# Patient Record
Sex: Male | Born: 1935 | Race: Black or African American | Hispanic: No | Marital: Married | Smoking: Former smoker
Health system: Southern US, Community
[De-identification: ages and names within clinical notes are randomized; demographics above are authoritative.]

## PROBLEM LIST (undated history)

## (undated) DIAGNOSIS — I5042 Chronic combined systolic (congestive) and diastolic (congestive) heart failure: Secondary | ICD-10-CM

## (undated) DIAGNOSIS — N183 Chronic kidney disease, stage 3 unspecified: Secondary | ICD-10-CM

## (undated) DIAGNOSIS — R972 Elevated prostate specific antigen [PSA]: Secondary | ICD-10-CM

## (undated) DIAGNOSIS — I442 Atrioventricular block, complete: Secondary | ICD-10-CM

## (undated) DIAGNOSIS — M109 Gout, unspecified: Secondary | ICD-10-CM

## (undated) DIAGNOSIS — L84 Corns and callosities: Secondary | ICD-10-CM

## (undated) DIAGNOSIS — Z950 Presence of cardiac pacemaker: Secondary | ICD-10-CM

## (undated) DIAGNOSIS — R001 Bradycardia, unspecified: Secondary | ICD-10-CM

## (undated) DIAGNOSIS — I428 Other cardiomyopathies: Secondary | ICD-10-CM

## (undated) DIAGNOSIS — I482 Chronic atrial fibrillation, unspecified: Secondary | ICD-10-CM

## (undated) DIAGNOSIS — I10 Essential (primary) hypertension: Secondary | ICD-10-CM

## (undated) DIAGNOSIS — R4189 Other symptoms and signs involving cognitive functions and awareness: Secondary | ICD-10-CM

## (undated) HISTORY — DX: Chronic kidney disease, stage 3 (moderate): N18.3

## (undated) HISTORY — DX: Elevated prostate specific antigen (PSA): R97.20

## (undated) HISTORY — DX: Chronic atrial fibrillation, unspecified: I48.20

## (undated) HISTORY — DX: Other symptoms and signs involving cognitive functions and awareness: R41.89

## (undated) HISTORY — DX: Presence of cardiac pacemaker: Z95.0

## (undated) HISTORY — DX: Gout, unspecified: M10.9

## (undated) HISTORY — DX: Atrioventricular block, complete: I44.2

## (undated) HISTORY — DX: Essential (primary) hypertension: I10

## (undated) HISTORY — DX: Other cardiomyopathies: I42.8

## (undated) HISTORY — DX: Chronic combined systolic (congestive) and diastolic (congestive) heart failure: I50.42

## (undated) HISTORY — DX: Corns and callosities: L84

## (undated) HISTORY — DX: Chronic kidney disease, stage 3 unspecified: N18.30

---

## 1996-05-12 HISTORY — PX: PACEMAKER INSERTION: SHX728

## 2004-01-03 ENCOUNTER — Inpatient Hospital Stay (HOSPITAL_COMMUNITY): Admission: AD | Admit: 2004-01-03 | Discharge: 2004-01-06 | Payer: Self-pay | Admitting: *Deleted

## 2012-02-11 ENCOUNTER — Encounter: Payer: Self-pay | Admitting: Internal Medicine

## 2012-02-11 ENCOUNTER — Encounter: Payer: Self-pay | Admitting: *Deleted

## 2012-02-11 ENCOUNTER — Ambulatory Visit (INDEPENDENT_AMBULATORY_CARE_PROVIDER_SITE_OTHER): Payer: Medicare Other | Admitting: Internal Medicine

## 2012-02-11 VITALS — BP 130/68 | HR 71 | Resp 18 | Ht 66.0 in | Wt 117.1 lb

## 2012-02-11 DIAGNOSIS — I442 Atrioventricular block, complete: Secondary | ICD-10-CM

## 2012-02-11 DIAGNOSIS — I10 Essential (primary) hypertension: Secondary | ICD-10-CM

## 2012-02-11 DIAGNOSIS — I4891 Unspecified atrial fibrillation: Secondary | ICD-10-CM

## 2012-02-11 DIAGNOSIS — Z950 Presence of cardiac pacemaker: Secondary | ICD-10-CM | POA: Insufficient documentation

## 2012-02-11 DIAGNOSIS — Z01812 Encounter for preprocedural laboratory examination: Secondary | ICD-10-CM

## 2012-02-11 DIAGNOSIS — I495 Sick sinus syndrome: Secondary | ICD-10-CM

## 2012-02-11 NOTE — Assessment & Plan Note (Signed)
His pacemaker has reached elective placement. We will schedule him to undergo pacemaker removal and insertion of a new device in the next few weeks.

## 2012-02-11 NOTE — Assessment & Plan Note (Signed)
His ventricular rate is well controlled with underlying complete heart block. No change in medical therapy.

## 2012-02-11 NOTE — Assessment & Plan Note (Signed)
His blood pressure is well controlled. He will continue a low-sodium diet and his current medical therapy.

## 2012-02-11 NOTE — Progress Notes (Signed)
HPI Charles Hendricks is referred today for evaluation of a device at [facility]. He is a very pleasant elderly man with chronic complete heart block and atrial fibrillation. He is status post pacemaker insertion initially in 1998 with subsequent generator change in 2005. In the interim, he has been stable. He denies syncope, chest pain, peripheral edema, or shortness of breath. He denies dietary indiscretion. No syncope. He has no other complaints today. No Known Allergies   Current Outpatient Prescriptions  Medication Sig Dispense Refill  . digoxin (LANOXIN) 0.125 MG tablet       . lisinopril (PRINIVIL,ZESTRIL) 10 MG tablet       . metoprolol succinate (TOPROL-XL) 100 MG 24 hr tablet       . triamterene-hydrochlorothiazide (MAXZIDE-25) 37.5-25 MG per tablet       . warfarin (COUMADIN) 4 MG tablet          No past medical history on file.  ROS:   All systems reviewed and negative except as noted in the HPI.   No past surgical history on file.   No family history on file.   History   Social History  . Marital Status: Married    Spouse Name: N/A    Number of Children: N/A  . Years of Education: N/A   Occupational History  . Not on file.   Social History Main Topics  . Smoking status: Never Smoker   . Smokeless tobacco: Not on file  . Alcohol Use: Not on file  . Drug Use: Not on file  . Sexually Active: Not on file   Other Topics Concern  . Not on file   Social History Narrative  . No narrative on file     BP 130/68  Pulse 71  Resp 18  Ht 5' 6" (1.676 m)  Wt 117 lb 1.9 oz (53.125 kg)  BMI 18.90 kg/m2  SpO2 97%  Physical Exam:  Well appearing elderly man, NAD HEENT: Unremarkable Neck:  No JVD, no thyromegally Lungs:  Clear with no wheezes, rales, or rhonchi. Well-healed ICD incision. HEART:  Regular rate rhythm, no murmurs, no rubs, no clicks Abd:  soft, positive bowel sounds, no organomegally, no rebound, no guarding Ext:  2 plus pulses, no edema, no cyanosis,  no clubbing Skin:  No rashes no nodules Neuro:  CN II through XII intact, motor grossly intact  DEVICE  Normal device function.  See PaceArt for details.   Assess/Plan:  

## 2012-02-25 ENCOUNTER — Encounter: Payer: Self-pay | Admitting: Internal Medicine

## 2012-02-26 ENCOUNTER — Other Ambulatory Visit (INDEPENDENT_AMBULATORY_CARE_PROVIDER_SITE_OTHER): Payer: Medicare Other

## 2012-02-26 DIAGNOSIS — I4891 Unspecified atrial fibrillation: Secondary | ICD-10-CM

## 2012-02-26 DIAGNOSIS — Z01812 Encounter for preprocedural laboratory examination: Secondary | ICD-10-CM

## 2012-02-26 LAB — CBC WITH DIFFERENTIAL/PLATELET
Basophils Absolute: 0 10*3/uL (ref 0.0–0.1)
Basophils Relative: 0.4 % (ref 0.0–3.0)
Eosinophils Relative: 3.5 % (ref 0.0–5.0)
HCT: 34.2 % — ABNORMAL LOW (ref 39.0–52.0)
MCHC: 32.5 g/dL (ref 30.0–36.0)
MCV: 97.2 fl (ref 78.0–100.0)
Monocytes Absolute: 0.6 10*3/uL (ref 0.1–1.0)
Monocytes Relative: 12 % (ref 3.0–12.0)
RBC: 3.52 Mil/uL — ABNORMAL LOW (ref 4.22–5.81)
WBC: 4.7 10*3/uL (ref 4.5–10.5)

## 2012-02-26 LAB — BASIC METABOLIC PANEL
BUN: 38 mg/dL — ABNORMAL HIGH (ref 6–23)
CO2: 29 mEq/L (ref 19–32)
Calcium: 9.2 mg/dL (ref 8.4–10.5)
Chloride: 106 mEq/L (ref 96–112)
Glucose, Bld: 100 mg/dL — ABNORMAL HIGH (ref 70–99)

## 2012-03-04 MED ORDER — SODIUM CHLORIDE 0.9 % IV SOLN: 250.0000 mL | INTRAVENOUS | Status: DC

## 2012-03-04 MED ORDER — SODIUM CHLORIDE 0.9 % IJ SOLN: 3.0000 mL | Freq: Two times a day (BID) | INTRAMUSCULAR | Status: DC

## 2012-03-04 MED ORDER — SODIUM CHLORIDE 0.45 % IV SOLN
INTRAVENOUS | Status: DC
  Administered 2012-03-05: 07:00:00 via INTRAVENOUS

## 2012-03-04 MED ORDER — CEFAZOLIN SODIUM-DEXTROSE 2-3 GM-% IV SOLR
2.0000 g | INTRAVENOUS | Status: DC
  Filled 2012-03-04 (×2): qty 50

## 2012-03-04 MED ORDER — SODIUM CHLORIDE 0.9 % IJ SOLN: 3.0000 mL | INTRAMUSCULAR | Status: DC | PRN

## 2012-03-04 MED ORDER — SODIUM CHLORIDE 0.9 % IR SOLN
80.0000 mg | Status: DC
  Filled 2012-03-04: qty 2

## 2012-03-04 NOTE — Addendum Note (Signed)
Addended by: Marrion Coy L on: 03/04/2012 12:06 PM   Modules accepted: Orders

## 2012-03-05 ENCOUNTER — Encounter (HOSPITAL_COMMUNITY)
Admission: RE | Disposition: A | Discharge status: Home / Self Care | Payer: Self-pay | Source: Ambulatory Visit | Attending: Internal Medicine

## 2012-03-05 ENCOUNTER — Ambulatory Visit (HOSPITAL_COMMUNITY): Payer: Medicare Other

## 2012-03-05 ENCOUNTER — Ambulatory Visit (HOSPITAL_COMMUNITY)
Admission: RE | Admit: 2012-03-05 | Discharge: 2012-03-05 | Disposition: A | Discharge status: Home / Self Care | Payer: Medicare Other | Source: Ambulatory Visit | Attending: Internal Medicine | Admitting: Internal Medicine

## 2012-03-05 ENCOUNTER — Encounter: Payer: Self-pay | Admitting: *Deleted

## 2012-03-05 DIAGNOSIS — I498 Other specified cardiac arrhythmias: Secondary | ICD-10-CM

## 2012-03-05 DIAGNOSIS — Z45018 Encounter for adjustment and management of other part of cardiac pacemaker: Secondary | ICD-10-CM | POA: Insufficient documentation

## 2012-03-05 DIAGNOSIS — Z01812 Encounter for preprocedural laboratory examination: Secondary | ICD-10-CM

## 2012-03-05 DIAGNOSIS — I4891 Unspecified atrial fibrillation: Secondary | ICD-10-CM | POA: Insufficient documentation

## 2012-03-05 HISTORY — PX: PERMANENT PACEMAKER GENERATOR CHANGE: SHX6022

## 2012-03-05 LAB — SURGICAL PCR SCREEN
MRSA, PCR: NEGATIVE
Staphylococcus aureus: NEGATIVE

## 2012-03-05 SURGERY — PERMANENT PACEMAKER GENERATOR CHANGE
Anesthesia: LOCAL

## 2012-03-05 MED ORDER — FENTANYL CITRATE 0.05 MG/ML IJ SOLN
INTRAMUSCULAR | Status: AC
  Filled 2012-03-05: qty 2

## 2012-03-05 MED ORDER — LIDOCAINE HCL (PF) 1 % IJ SOLN
INTRAMUSCULAR | Status: AC
  Filled 2012-03-05: qty 60

## 2012-03-05 MED ORDER — MIDAZOLAM HCL 5 MG/5ML IJ SOLN
INTRAMUSCULAR | Status: AC
  Filled 2012-03-05: qty 5

## 2012-03-05 MED ORDER — MUPIROCIN 2 % EX OINT
TOPICAL_OINTMENT | CUTANEOUS | Status: AC
  Filled 2012-03-05: qty 22

## 2012-03-05 MED ORDER — MUPIROCIN 2 % EX OINT
TOPICAL_OINTMENT | Freq: Two times a day (BID) | CUTANEOUS | Status: DC
  Administered 2012-03-05: 07:00:00 via NASAL

## 2012-03-05 NOTE — Op Note (Signed)
VVI PPM removal and insertion of a new device without immediate complication. Device was placed under pectoralis major.Z#[id number].

## 2012-03-05 NOTE — Op Note (Signed)
NAMESIXTO, HEIGEL NO.:  [id number]  MEDICAL RECORD NO.:  [id number]  LOCATION:  [facility]                         FACILITY:  [facility]  PHYSICIAN:  Doylene Canning. Ladona Ridgel, MD    DATE OF BIRTH:  08-Jan-1925  DATE OF PROCEDURE:  03/05/2012 DATE OF DISCHARGE:                              OPERATIVE REPORT   PROCEDURE PERFORMED:  Removal of a previously implanted single-chamber pacemaker, which had reached elective replacement followed by insertion of a new single-chamber pacemaker followed by pacemaker pocket revision.  INTRODUCTION:  The patient is an 78 year old man with a long history of symptomatic bradycardia and chronic atrial fibrillation.  His permanent pacemaker, which was implanted in 2005 has now reached elective replacement.  He is now referred for pacemaker removal and insertion of a new device.  Of note, the patient is very thin and has very little subcutaneous tissue, and the plan would be to place the new pacemaker subpectorally.  PROCEDURE:  After informed consent was obtained, the patient was taken to the diagnostic EP lab in a fasting state.  After usual preparation and draping, intravenous fentanyl and midazolam was given for sedation. 30 mL of lidocaine was infiltrated into the left infraclavicular region. A 5-cm incision was carried out over this region.  Electrocautery was utilized to dissect down to the pacemaker pocket.  The generator was removed with gentle traction.  Electrocautery was utilized to free up the pacemaker leads.  The lead was evaluated and the R-waves were 5, which was chronic and the threshold was 1.1 V at 0.5 msec with a pacing impedance of 446 ohms.  With these satisfactory parameters, the new Medtronic Sensia single-chamber pacemaker was connected to the ventricular pacing lead and at this point a subpectoral pocket was fashioned utilizing blunt dissection and electrocautery.  Having accomplished this, the new device was placed  under the pectoralis major muscle.  The pocket was irrigated with antibiotic irrigation. Electrocautery was utilized to assure hemostasis and the incision was closed with 2-0 and 3-0 Vicryl.  Benzoin and Steri-Strips were painted on the skin.  Pressure dressing was applied and the patient was returned to his room in satisfactory condition.  COMPLICATIONS:  There were no immediate procedure complications.  RESULTS:  This demonstrates successful removal of a previously implanted Medtronic single-chamber pacemaker followed by successful pacemaker pocket revision, insertion of a new single-chamber pacemaker without any immediate procedure complications.     Doylene Canning. Ladona Ridgel, MD     GWT/MEDQ  D:  03/05/2012  T:  03/05/2012  Job:  [id number]

## 2012-03-05 NOTE — H&P (View-Only) (Signed)
HPI Charles Hendricks is referred today for evaluation of a device at [facility]. He is a very pleasant elderly man with chronic complete heart block and atrial fibrillation. He is status post pacemaker insertion initially in 1998 with subsequent generator change in 2005. In the interim, he has been stable. He denies syncope, chest pain, peripheral edema, or shortness of breath. He denies dietary indiscretion. No syncope. He has no other complaints today. No Known Allergies   Current Outpatient Prescriptions  Medication Sig Dispense Refill  . digoxin (LANOXIN) 0.125 MG tablet       . lisinopril (PRINIVIL,ZESTRIL) 10 MG tablet       . metoprolol succinate (TOPROL-XL) 100 MG 24 hr tablet       . triamterene-hydrochlorothiazide (MAXZIDE-25) 37.5-25 MG per tablet       . warfarin (COUMADIN) 4 MG tablet          No past medical history on file.  ROS:   All systems reviewed and negative except as noted in the HPI.   No past surgical history on file.   No family history on file.   History   Social History  . Marital Status: Married    Spouse Name: N/A    Number of Children: N/A  . Years of Education: N/A   Occupational History  . Not on file.   Social History Main Topics  . Smoking status: Never Smoker   . Smokeless tobacco: Not on file  . Alcohol Use: Not on file  . Drug Use: Not on file  . Sexually Active: Not on file   Other Topics Concern  . Not on file   Social History Narrative  . No narrative on file     BP 130/68  Pulse 71  Resp 18  Ht 5\' 6"  (1.676 m)  Wt 117 lb 1.9 oz (53.125 kg)  BMI 18.90 kg/m2  SpO2 97%  Physical Exam:  Well appearing elderly man, NAD HEENT: Unremarkable Neck:  No JVD, no thyromegally Lungs:  Clear with no wheezes, rales, or rhonchi. Well-healed ICD incision. HEART:  Regular rate rhythm, no murmurs, no rubs, no clicks Abd:  soft, positive bowel sounds, no organomegally, no rebound, no guarding Ext:  2 plus pulses, no edema, no cyanosis,  no clubbing Skin:  No rashes no nodules Neuro:  CN II through XII intact, motor grossly intact  DEVICE  Normal device function.  See PaceArt for details.   Assess/Plan:

## 2012-03-05 NOTE — Interval H&P Note (Signed)
History and Physical Interval Note:  03/05/2012 7:21 AM  Charles Hendricks  has presented today for surgery, with the diagnosis of End of life  The various methods of treatment have been discussed with the patient and family. After consideration of risks, benefits and other options for treatment, the patient has consented to  Procedure(s) (LRB) with comments: PERMANENT PACEMAKER GENERATOR CHANGE (N/A) as a surgical intervention .  The patient's history has been reviewed, patient examined, no change in status, stable for surgery.  I have reviewed the patient's chart and labs.  Questions were answered to the patient's satisfaction.     Lewayne Bunting

## 2012-03-15 ENCOUNTER — Encounter: Payer: Self-pay | Admitting: Internal Medicine

## 2012-03-15 ENCOUNTER — Ambulatory Visit (INDEPENDENT_AMBULATORY_CARE_PROVIDER_SITE_OTHER): Payer: Medicare Other | Admitting: *Deleted

## 2012-03-15 DIAGNOSIS — I442 Atrioventricular block, complete: Secondary | ICD-10-CM

## 2012-03-15 LAB — PACEMAKER DEVICE OBSERVATION
BMOD-0003RV: 30
BRDY-0002RV: 60 {beats}/min
BRDY-0004RV: 130 {beats}/min
RV LEAD THRESHOLD: 1 V
VENTRICULAR PACING PM: 76

## 2012-03-15 NOTE — Progress Notes (Signed)
Wound check-PPM 

## 2013-02-11 ENCOUNTER — Other Ambulatory Visit: Payer: Self-pay | Admitting: Interventional Cardiology

## 2013-02-22 ENCOUNTER — Ambulatory Visit (INDEPENDENT_AMBULATORY_CARE_PROVIDER_SITE_OTHER): Payer: Medicare Other | Admitting: Pharmacist

## 2013-02-22 DIAGNOSIS — I4891 Unspecified atrial fibrillation: Secondary | ICD-10-CM

## 2013-02-22 LAB — POCT INR: INR: 2.8

## 2013-02-25 ENCOUNTER — Other Ambulatory Visit: Payer: Self-pay | Admitting: Interventional Cardiology

## 2013-03-05 ENCOUNTER — Other Ambulatory Visit: Payer: Self-pay | Admitting: Interventional Cardiology

## 2013-03-30 ENCOUNTER — Other Ambulatory Visit: Payer: Self-pay | Admitting: Interventional Cardiology

## 2013-04-30 ENCOUNTER — Emergency Department (HOSPITAL_COMMUNITY): Payer: Medicare Other

## 2013-04-30 ENCOUNTER — Emergency Department (HOSPITAL_COMMUNITY)
Admission: EM | Admit: 2013-04-30 | Discharge: 2013-04-30 | Disposition: A | Discharge status: Home / Self Care | Payer: Medicare Other | Attending: Emergency Medicine | Admitting: Emergency Medicine

## 2013-04-30 ENCOUNTER — Encounter (HOSPITAL_COMMUNITY): Payer: Self-pay | Admitting: Emergency Medicine

## 2013-04-30 DIAGNOSIS — R600 Localized edema: Secondary | ICD-10-CM

## 2013-04-30 DIAGNOSIS — R609 Edema, unspecified: Secondary | ICD-10-CM | POA: Insufficient documentation

## 2013-04-30 DIAGNOSIS — Z7901 Long term (current) use of anticoagulants: Secondary | ICD-10-CM | POA: Insufficient documentation

## 2013-04-30 DIAGNOSIS — Z79899 Other long term (current) drug therapy: Secondary | ICD-10-CM | POA: Insufficient documentation

## 2013-04-30 DIAGNOSIS — I4891 Unspecified atrial fibrillation: Secondary | ICD-10-CM | POA: Insufficient documentation

## 2013-04-30 DIAGNOSIS — I509 Heart failure, unspecified: Secondary | ICD-10-CM | POA: Insufficient documentation

## 2013-04-30 DIAGNOSIS — E8809 Other disorders of plasma-protein metabolism, not elsewhere classified: Secondary | ICD-10-CM | POA: Insufficient documentation

## 2013-04-30 HISTORY — DX: Bradycardia, unspecified: R00.1

## 2013-04-30 LAB — CBC
HCT: 33.3 % — ABNORMAL LOW (ref 39.0–52.0)
Hemoglobin: 11 g/dL — ABNORMAL LOW (ref 13.0–17.0)
MCH: 31.3 pg (ref 26.0–34.0)
MCV: 94.9 fL (ref 78.0–100.0)
RBC: 3.51 MIL/uL — ABNORMAL LOW (ref 4.22–5.81)
WBC: 4.4 10*3/uL (ref 4.0–10.5)

## 2013-04-30 LAB — COMPREHENSIVE METABOLIC PANEL
BUN: 35 mg/dL — ABNORMAL HIGH (ref 6–23)
CO2: 26 mEq/L (ref 19–32)
Calcium: 8.9 mg/dL (ref 8.4–10.5)
Creatinine, Ser: 1.64 mg/dL — ABNORMAL HIGH (ref 0.50–1.35)
GFR calc Af Amer: 42 mL/min — ABNORMAL LOW (ref >90)
GFR calc non Af Amer: 36 mL/min — ABNORMAL LOW (ref >90)
Glucose, Bld: 113 mg/dL — ABNORMAL HIGH (ref 70–99)

## 2013-04-30 LAB — TROPONIN I: Troponin I: <0.3 ng/mL (ref <0.30)

## 2013-04-30 LAB — PROTIME-INR
INR: 2.52 — ABNORMAL HIGH (ref 0.00–1.49)
Prothrombin Time: 26.3 seconds — ABNORMAL HIGH (ref 11.6–15.2)

## 2013-04-30 MED ORDER — POTASSIUM CHLORIDE CRYS ER 20 MEQ PO TBCR
40.0000 meq | EXTENDED_RELEASE_TABLET | Freq: Once | ORAL | Status: AC
  Administered 2013-04-30: 40 meq via ORAL
  Filled 2013-04-30: qty 2

## 2013-04-30 MED ORDER — POTASSIUM CHLORIDE CRYS ER 20 MEQ PO TBCR: 20.0000 meq | EXTENDED_RELEASE_TABLET | Freq: Every day | ORAL | Status: DC

## 2013-04-30 MED ORDER — FUROSEMIDE 10 MG/ML IJ SOLN
40.0000 mg | Freq: Once | INTRAMUSCULAR | Status: AC
  Administered 2013-04-30: 40 mg via INTRAVENOUS
  Filled 2013-04-30: qty 4

## 2013-04-30 MED ORDER — FUROSEMIDE 20 MG PO TABS: 20.0000 mg | ORAL_TABLET | Freq: Every day | ORAL | Status: DC

## 2013-04-30 MED ORDER — POTASSIUM CHLORIDE CRYS ER 20 MEQ PO TBCR: 20.0000 meq | EXTENDED_RELEASE_TABLET | Freq: Two times a day (BID) | ORAL | Status: DC

## 2013-04-30 NOTE — ED Provider Notes (Signed)
CSN: [id number]     Arrival date & time 04/30/13  0908 History   First MD Initiated Contact with Patient 04/30/13 0915     Chief Complaint  Patient presents with  . Leg Swelling   (Consider location/radiation/quality/duration/timing/severity/associated sxs/prior Treatment) The history is provided by the patient.  pt notes progressive bilateral leg swelling for the past 1-2 weeks. Constant. Mod-sev. Denies hx same, x possibly mild bil ankle/foot swelling. States now swelling is up to abdomen. Denies sob. No orthopnea or pnd. Is eating/drinking normally. States urinating of normal amount and frequency. Denies hx of liver or kidney disease. No hx chf. Denies any current or recent chest pain or discomfort. States compliant w taking meds, and no recent change in meds.       No past medical history on file. No past surgical history on file. No family history on file. History  Substance Use Topics  . Smoking status: Never Smoker   . Smokeless tobacco: Not on file  . Alcohol Use: Not on file    Review of Systems  Constitutional: Negative for fever and chills.  HENT: Negative for sore throat.   Eyes: Negative for redness.  Respiratory: Negative for cough and shortness of breath.   Cardiovascular: Positive for leg swelling. Negative for chest pain.  Gastrointestinal: Negative for nausea, vomiting, abdominal pain and diarrhea.  Genitourinary: Negative for flank pain.  Musculoskeletal: Negative for back pain and neck pain.  Skin: Negative for rash.  Neurological: Negative for headaches.  Hematological: Does not bruise/bleed easily.  Psychiatric/Behavioral: Negative for confusion.    Allergies  Review of patient's allergies indicates no known allergies.  Home Medications   Current Outpatient Rx  Name  Route  Sig  Dispense  Refill  . acetaminophen (TYLENOL) 500 MG tablet   Oral   Take 1,000 mg by mouth every 6 (six) hours as needed. Pain         . digoxin (LANOXIN) 0.125 MG  tablet      take 1 tablet by mouth once daily   30 tablet   1     Patient need to schedule a follow up appointment.  ...   . lisinopril (PRINIVIL,ZESTRIL) 10 MG tablet      take 1 tablet by mouth once daily   30 tablet   1     Please call to schedule a follow up appointment.33 ...   . metoprolol succinate (TOPROL-XL) 100 MG 24 hr tablet      take 1 tablet by mouth once daily   30 tablet   1     Patient needs to schedule a follow up appointment. ...   . triamterene-hydrochlorothiazide (MAXZIDE-25) 37.5-25 MG per tablet      take 1 tablet by mouth every morning   30 tablet   1     Please call to schedule a follow up appointment.33 ...   . warfarin (COUMADIN) 4 MG tablet      TAKE 1 TABLET BY MOUTH ONCE DAILY EXCEPT A 1/2 ON MONDAY,WEDNESDAY   30 tablet   5   . warfarin (COUMADIN) 5 MG tablet   Oral   Take 5 mg by mouth daily.          BP 159/77  Pulse 78  Resp 18 Physical Exam  Nursing note and vitals reviewed. Constitutional: He is oriented to person, place, and time. He appears well-developed and well-nourished. No distress.  HENT:  Head: Atraumatic.  Eyes: Conjunctivae are normal.  Neck: Neck supple.  No JVD present. No tracheal deviation present.  Cardiovascular: Normal rate, regular rhythm, normal heart sounds and intact distal pulses.   Pulmonary/Chest: Effort normal and breath sounds normal. No accessory muscle usage. No respiratory distress.  Abdominal: Soft. Bowel sounds are normal. He exhibits no distension and no mass. There is no tenderness. There is no rebound and no guarding.  Musculoskeletal: Normal range of motion. He exhibits edema. He exhibits no tenderness.  Symmetric bilateral leg edema to abd.  Neurological: He is alert and oriented to person, place, and time.  Skin: Skin is warm and dry. He is not diaphoretic.  Psychiatric: He has a normal mood and affect.    ED Course  Procedures (including critical care time)   Results for  orders placed during the hospital encounter of 04/30/13  PRO B NATRIURETIC PEPTIDE      Result Value Range   Pro B Natriuretic peptide (BNP) 3155.0 (*) 0 - 450 pg/mL  CBC      Result Value Range   WBC 4.4  4.0 - 10.5 K/uL   RBC 3.51 (*) 4.22 - 5.81 MIL/uL   Hemoglobin 11.0 (*) 13.0 - 17.0 g/dL   HCT 95.2 (*) 84.1 - 32.4 %   MCV 94.9  78.0 - 100.0 fL   MCH 31.3  26.0 - 34.0 pg   MCHC 33.0  30.0 - 36.0 g/dL   RDW 40.1  02.7 - 25.3 %   Platelets 196  150 - 400 K/uL  COMPREHENSIVE METABOLIC PANEL      Result Value Range   Sodium 143  135 - 145 mEq/L   Potassium 3.4 (*) 3.5 - 5.1 mEq/L   Chloride 107  96 - 112 mEq/L   CO2 26  19 - 32 mEq/L   Glucose, Bld 113 (*) 70 - 99 mg/dL   BUN 35 (*) 6 - 23 mg/dL   Creatinine, Ser 6.64 (*) 0.50 - 1.35 mg/dL   Calcium 8.9  8.4 - 40.3 mg/dL   Total Protein 6.8  6.0 - 8.3 g/dL   Albumin 3.1 (*) 3.5 - 5.2 g/dL   AST 25  0 - 37 U/L   ALT 17  0 - 53 U/L   Alkaline Phosphatase 87  39 - 117 U/L   Total Bilirubin 0.8  0.3 - 1.2 mg/dL   GFR calc non Af Amer 36 (*) >90 mL/min   GFR calc Af Amer 42 (*) >90 mL/min  TROPONIN I      Result Value Range   Troponin I <0.30  <0.30 ng/mL  PROTIME-INR      Result Value Range   Prothrombin Time 26.3 (*) 11.6 - 15.2 seconds   INR 2.52 (*) 0.00 - 1.49  DIGOXIN LEVEL      Result Value Range   Digoxin Level 0.6 (*) 0.8 - 2.0 ng/mL   Dg Chest 2 View  04/30/2013   CLINICAL DATA:  Bilateral leg pain and swelling.  EXAM: CHEST  2 VIEW  COMPARISON:  03/05/2012.  FINDINGS: The cardiopericardial silhouette is enlarged. This appears little changed compared to prior exam. There is a left subclavian single lead pacemaker. Chronic right pleural apical thickening. There is no airspace disease. No effusion. Emphysema is present.  IMPRESSION: Cardiomegaly without failure. No interval change or acute abnormality.   Electronically Signed   By: Andreas Newport M.D.   On: 04/30/2013 10:47     EKG Interpretation     Date/Time:  Saturday April 30 2013 09:37:36 EST Ventricular Rate:  80 PR Interval:    QRS Duration: 94 QT Interval:  406 QTC Calculation: 468 R Axis:   -62 Text Interpretation:  Atrial fibrillation Ventricular premature complex Left anterior fascicular block Consider left ventricular hypertrophy Nonspecific T abnormalities, lateral leads No previous tracing Confirmed by Deniel Mcquiston  MD, Abagail Limb ([id number]) on 04/30/2013 9:45:48 AM            MDM  Iv ns. Labs. Monitor.  Reviewed nursing notes and prior charts for additional history.   Pt with chronic afib, is therapeutic on coumadin w inr 2.5.  Renal fxn improved from prior. k sl low. hgb c/w prior.   bnp elevated, although no prior result to compare.  Leg edema. Lasix iv.  kcl po.  Discussed tx options with patient including offering admission for diuresis, med adjustment, observation, etc - pt indicates he does not want to be admitted to the hospital, he requests that we discharge him home. He states he would take rx/meds at home, but doesn't want to stay overnight in the hospital.  States he has family in area who can assist if he needs them.  Pt indicates he feels he is breathing at baseline, and can manage for himself at home.  Recheck good urine output.   No increased wob from prior.   Again discussed admission to hospital, given age, edema, elevated bnp - pt voices his understanding, indicates he does appreciate our concern and ed care provided, but he is not willing to stay in hospital, requests d/c.   Will give few day rx lasix, and request pcp follow up Monday.  Discussed to return to ED if reconsider/wants admit, if worse, trouble breathing, other concern.        Suzi Roots, MD 04/30/13 [phone]

## 2013-04-30 NOTE — ED Notes (Signed)
Bilateral leg swelling since 1 week ago. Denies sob at rest, denies chest pain.  Swelling present bilaterally.  Scrotum swelling.

## 2013-04-30 NOTE — ED Notes (Signed)
Bilateral leg swelling that progressively got worse since 1 week ago per pt, also c/o increased dyspnea on exertion.

## 2013-05-03 ENCOUNTER — Telehealth: Payer: Self-pay

## 2013-05-04 MED ORDER — METOPROLOL SUCCINATE ER 100 MG PO TB24: 100.0000 mg | ORAL_TABLET | Freq: Every day | ORAL | Status: DC

## 2013-05-04 MED ORDER — LISINOPRIL 10 MG PO TABS: 10.0000 mg | ORAL_TABLET | Freq: Every day | ORAL | Status: DC

## 2013-05-04 MED ORDER — TRIAMTERENE-HCTZ 37.5-25 MG PO TABS: 1.0000 | ORAL_TABLET | Freq: Every day | ORAL | Status: DC

## 2013-05-04 NOTE — Telephone Encounter (Signed)
Done

## 2013-06-04 ENCOUNTER — Telehealth: Payer: Self-pay | Admitting: Physician Assistant

## 2013-06-04 NOTE — Telephone Encounter (Signed)
Patient called answering service for refills. He is quite confused about his medications. He has a hx of AFib, s/p pacer.   He was seen in the ED last month with volume overload. He refused admission. He has not been seen in the coumadin clinic since 02/2013. He tells me that he also had the flu recently.   He was able to tell me that he is only taking Furosemide and Potassium. He has been out of his other medications for at least 2 weeks.  It is not clear how long he has been out as he does not seem sure. He states his edema is better.  He has stable DOE.  No CP or syncope. I am not sure how he has been taking his meds. He has Dig 0.125, Lisinopril 10, Toprol 100, Maxzide 37.5/25 and coumadin listed in addition to Lasix and K+. I have recommended that he be seen in the office for an office visit to review his records and his medications to see what he should be on.  Not sure he is a candidate for coumadin with non-compliance issues. I advised him to go to the ED if he develops chest pain, worsening dyspnea, edema, syncope or near syncope this weekend. Otherwise, will have RN for Dr. Verdis Prime call him Monday to help get him an appt so we can review his medications. He will need a BMET to follow up on renal fxn and K+ given recent initiation of lasix and K+. Tereso Newcomer, PA-C   06/04/2013 12:25 PM

## 2013-06-05 NOTE — Telephone Encounter (Signed)
I agree. He needs to come in for f/u.

## 2013-06-06 NOTE — Telephone Encounter (Signed)
called pt to schedule an appt.appt made with Dr.Smith for 06/07/13 @3 :45pm. pt is confused and does not know what mediactions he is taking or which medications need refills.pt got upset and thought I was calling about an outstanding bill owed to the office.adv pt I was calling to check on his well being.pt sts that his lower edema has improved, but his feet are "still bad"   Pt sts that his wife past 2-3 months ago and he has had a difficult time. Asked pt if he would be interested in nurse coming to his home to assist him with his medications and check on him. Pt sts "No, I dont need any help"the patient was adv to bring all medication bottles with him to his appt.pt given the office address and phone number to call if he has issue getting here.pt verbalized understanding

## 2013-06-07 ENCOUNTER — Encounter (INDEPENDENT_AMBULATORY_CARE_PROVIDER_SITE_OTHER): Payer: Self-pay

## 2013-06-07 ENCOUNTER — Ambulatory Visit (INDEPENDENT_AMBULATORY_CARE_PROVIDER_SITE_OTHER): Payer: Medicare Other | Admitting: Pharmacist

## 2013-06-07 ENCOUNTER — Encounter: Payer: Self-pay | Admitting: *Deleted

## 2013-06-07 ENCOUNTER — Ambulatory Visit (INDEPENDENT_AMBULATORY_CARE_PROVIDER_SITE_OTHER): Payer: Medicare Other | Admitting: Interventional Cardiology

## 2013-06-07 ENCOUNTER — Telehealth: Payer: Self-pay

## 2013-06-07 VITALS — BP 124/62 | HR 80 | Ht 67.0 in | Wt 121.0 lb

## 2013-06-07 DIAGNOSIS — Z950 Presence of cardiac pacemaker: Secondary | ICD-10-CM

## 2013-06-07 DIAGNOSIS — I4891 Unspecified atrial fibrillation: Secondary | ICD-10-CM

## 2013-06-07 DIAGNOSIS — Z7901 Long term (current) use of anticoagulants: Secondary | ICD-10-CM

## 2013-06-07 DIAGNOSIS — R4189 Other symptoms and signs involving cognitive functions and awareness: Secondary | ICD-10-CM

## 2013-06-07 DIAGNOSIS — I5042 Chronic combined systolic (congestive) and diastolic (congestive) heart failure: Secondary | ICD-10-CM | POA: Insufficient documentation

## 2013-06-07 DIAGNOSIS — Z5181 Encounter for therapeutic drug level monitoring: Secondary | ICD-10-CM

## 2013-06-07 DIAGNOSIS — I442 Atrioventricular block, complete: Secondary | ICD-10-CM

## 2013-06-07 DIAGNOSIS — I10 Essential (primary) hypertension: Secondary | ICD-10-CM

## 2013-06-07 DIAGNOSIS — I5032 Chronic diastolic (congestive) heart failure: Secondary | ICD-10-CM

## 2013-06-07 DIAGNOSIS — F09 Unspecified mental disorder due to known physiological condition: Secondary | ICD-10-CM

## 2013-06-07 LAB — POCT INR: INR: 1.1

## 2013-06-07 MED ORDER — FUROSEMIDE 40 MG PO TABS
40.0000 mg | ORAL_TABLET | Freq: Every day | ORAL | Status: DC
Start: 2013-06-07 — End: 2013-06-10

## 2013-06-07 MED ORDER — POTASSIUM CHLORIDE CRYS ER 20 MEQ PO TBCR: 20.0000 meq | EXTENDED_RELEASE_TABLET | Freq: Every day | ORAL | Status: DC

## 2013-06-07 MED ORDER — DIGOXIN 125 MCG PO TABS: 0.1250 mg | ORAL_TABLET | Freq: Every day | ORAL | Status: DC

## 2013-06-07 NOTE — Progress Notes (Signed)
Patient ID: Charles Hendricks, male   DOB: 09-28-1924, 80 y.o.   MRN: [id number] Past Medical History  Non-ischemic cardiomyopathy, last EF 35% by Echo at [facility] 2008   Hypertension   Atrial fibrillation/ Sick Sinus syndrome, 1998   Pacemaker, !998. Power source change 2005   Gout   elevated PSA, asymptomatic   Corns and callosities      [address] [place], [place]  [address] Phone: [phone] Fax:  [phone]  Date:  06/07/2013   ID:  Charles Hendricks, DOB 03/23/1925, MRN [id number]  PCP:  Pearla Dubonnet, MD   ASSESSMENT:  1. Atrial fibrillation, chronic 2. Chronic Coumadin therapy 3. Hypertension, controlled 4. Chronic systolic heart failure, improved 5. Medication noncompliance, due to cognitive impairment/memory loss suspect   PLAN:  1. We need to contact family members to help determine if he is safe to live at home and also inquire about whether cognitive impairment has been observed 2 . PT/INR today in Coumadin clinic 3. Basic metabolic panel 4. Current medical regimen is simplified to Coumadin as directed by the Coumadin clinic; digoxin 0.125 mg daily; furosemide 40 mg per day; and potassium 20 mEq per day 5. Notify primary physician   SUBJECTIVE: Charles Hendricks is a 80 y.o. male is here because he seems to be confused about his medical regimen. He is now on furosemide and potassium. He seems confused about whether he is taking anything else. Other medications previously taken ( lisinopril/Maxide/ metoprolol) have been discontinued by the patient.Marland Kitchen He was on metoprolol and lisinopril because of systolic dysfunction. He is on warfarin because of chronic atrial fibrillation for stroke prevention. At one moment he was sedated he is only taking furosemide and potassium. But then he will say I also take warfarin if asked. He has not had an INR performed since October. He denies orthopnea, PND, and lower extremity swelling. He states that Dr. Kevan Ny started  furosemide and potassium after his Lasix will several weeks ago. The swelling has now resolved.   Wt Readings from Last 3 Encounters:  06/07/13 121 lb (54.885 kg)  04/30/13 129 lb (58.514 kg)  03/05/12 119 lb (53.978 kg)     Past Medical History  Diagnosis Date  . Bradycardia   . Atrial fibrillation   . Complete heart block   . HTN (hypertension), benign   . Pacemaker-Medtronic     Current Outpatient Prescriptions  Medication Sig Dispense Refill  . digoxin (LANOXIN) 0.125 MG tablet Take 0.125 mg by mouth daily.      . furosemide (LASIX) 20 MG tablet Take 1 tablet (20 mg total) by mouth daily.  5 tablet  0  . lisinopril (PRINIVIL,ZESTRIL) 10 MG tablet Take 1 tablet (10 mg total) by mouth daily.  30 tablet  3  . potassium chloride SA (K-DUR,KLOR-CON) 20 MEQ tablet Take 1 tablet (20 mEq total) by mouth daily.  10 tablet  0  . warfarin (COUMADIN) 4 MG tablet Take 4 mg by mouth See admin instructions. Take one tablet everyday except 1/2 a tablet on Mondays and Wednesday      . metoprolol succinate (TOPROL-XL) 100 MG 24 hr tablet Take 1 tablet (100 mg total) by mouth daily. Take with or immediately following a meal.  30 tablet  3  . triamterene-hydrochlorothiazide (MAXZIDE-25) 37.5-25 MG per tablet Take 1 tablet by mouth daily.  30 tablet  3   No current facility-administered medications for this visit.    Allergies:   No Known Allergies  Social History:  The patient  reports that he has quit smoking. He does not have any smokeless tobacco history on file. He reports that he does not drink alcohol.   ROS:  Please see the history of present illness.    All other systems reviewed and negative.   OBJECTIVE: VS:  BP 124/62  Pulse 80  Ht 5\' 7"  (1.702 m)  Wt 121 lb (54.885 kg)  BMI 18.95 kg/m2 Well nourished, well developed, in no acute distress, slender, elderly, and occasionally appearing confused HEENT: normal Neck: JVD flat. Carotid bruit absent  Cardiac:  normal S1, S2; IIRR;  no murmur Lungs:  clear to auscultation bilaterally, no wheezing, rhonchi or rales Abd: soft, nontender, no hepatomegaly Ext: Edema trace to 1+ bilateral. Pulses trace posterior tibial bilaterally Skin: warm and dry Neuro:  CNs 2-12 intact, no focal abnormalities noted  EKG:  Atrial fibrillation with ventricular rate 70 beats per minute and occasional V. paced.       Signed, Darci NeedleHenry W. B. Smith III, MD 06/07/2013 3:44 PM

## 2013-06-07 NOTE — Telephone Encounter (Signed)
pt was seen inthe office tpday by Dr.Smith.Dr.Smith was concerned about the pt ability to manage his medications.pt gave [place] grandaughter phone number.called her and lmom for her to call the office.

## 2013-06-07 NOTE — Patient Instructions (Signed)
TAKE ONLY THE FOLLOWING MEDICATION  LASIX(FEUROSEMIDE)40MG  1 TABLET DAILY  POTASSIUM 1 TABLET DAILY  DIGOXIN 0.125MG  1 TABLET DAILY  COUMADIN AS DIRECTED  LAB TODAY:BMET  YOU WILL HAVE YOUR INR CHECKED TO DAY WITH OUR COUMADIN CLINIC  YOU HAVE A FOLLOW UP APPOINTMENT SCHEDULED FOR 09/06/13 @ 3PM

## 2013-06-08 LAB — BASIC METABOLIC PANEL
BUN: 53 mg/dL — ABNORMAL HIGH (ref 6–23)
CO2: 30 mEq/L (ref 19–32)
Calcium: 9.3 mg/dL (ref 8.4–10.5)
Chloride: 101 mEq/L (ref 96–112)
Creatinine, Ser: 1.8 mg/dL — ABNORMAL HIGH (ref 0.4–1.5)
GFR: 45.56 mL/min — ABNORMAL LOW (ref >60.00)
Glucose, Bld: 107 mg/dL — ABNORMAL HIGH (ref 70–99)
Potassium: 3.8 mEq/L (ref 3.5–5.1)
Sodium: 140 mEq/L (ref 135–145)

## 2013-06-08 NOTE — Progress Notes (Signed)
Quick Note:  Preliminary report reviewed by triage nurse and sent to MD desk. ______ 

## 2013-06-10 ENCOUNTER — Telehealth: Payer: Self-pay

## 2013-06-10 MED ORDER — FUROSEMIDE 40 MG PO TABS: 20.0000 mg | ORAL_TABLET | Freq: Every day | ORAL | Status: DC

## 2013-06-10 NOTE — Telephone Encounter (Signed)
pt phone rings out unable to lmom.called pt to give Dr.Smith instructions reduce lasix to 20mg  daily.

## 2013-06-10 NOTE — Telephone Encounter (Signed)
Message copied by Jarvis Newcomer on Fri Jun 10, 2013 11:38 AM ------      Message from: Verdis Prime      Created: Wed Jun 08, 2013 10:22 PM       Decrease furosemide to 20 mg a day. ------

## 2013-06-13 NOTE — Telephone Encounter (Signed)
retrurned pt call.pt given lab results and Dr.Smith instructiions to reduce Lasix to 20mg  daily. pt verbalized understanding.

## 2013-06-13 NOTE — Telephone Encounter (Signed)
Follow up     Returning call from last week.

## 2013-06-22 ENCOUNTER — Ambulatory Visit (INDEPENDENT_AMBULATORY_CARE_PROVIDER_SITE_OTHER): Payer: Medicare Other | Admitting: Pharmacist

## 2013-06-22 DIAGNOSIS — I4891 Unspecified atrial fibrillation: Secondary | ICD-10-CM

## 2013-06-22 DIAGNOSIS — Z5181 Encounter for therapeutic drug level monitoring: Secondary | ICD-10-CM

## 2013-06-22 LAB — POCT INR: INR: 2.1

## 2013-07-27 ENCOUNTER — Encounter: Payer: Self-pay | Admitting: Interventional Cardiology

## 2013-08-25 ENCOUNTER — Telehealth: Payer: Self-pay | Admitting: Interventional Cardiology

## 2013-08-25 NOTE — Telephone Encounter (Signed)
called the pt 3x. first time pt answered and phone was disconnected.called back pt answered not sure if there was a bad connection phone went dead.third attempt pt answered phone went dead

## 2013-08-25 NOTE — Telephone Encounter (Signed)
New message          Pt has pain in his stomach near his pace maker. Pt would like to come in to be seen.  Please give pt a call.

## 2013-08-26 NOTE — Telephone Encounter (Signed)
Please close this encounter

## 2013-08-26 NOTE — Telephone Encounter (Signed)
returned pt call.pt sts that he his nipples are sore when he squeezes them, pt denies chest pain,sob,swelling,palpitations,fever,chills.adv pt he has no cardiac symptoms and should f/u with his pcp pt verbalized understanding.after looking at pt chart pt has no showed 2x for her cvrr appt.will fwd fyi to [employer] pharm-d and Dr.Smith

## 2013-08-26 NOTE — Telephone Encounter (Signed)
New message  Pt called states that he called recently. He states that his chest is not hurting, However his nipples are sore. He is requesting a call back to determine why? He believes that it is heart related// requests a call back to discuss.

## 2013-08-29 NOTE — Telephone Encounter (Signed)
Patient notified and has been scheduled to get protime drawn next week when he sees Dr. Katrinka Blazing.

## 2013-08-30 NOTE — Telephone Encounter (Signed)
Please encourage to keep OV

## 2013-08-31 ENCOUNTER — Encounter: Payer: Self-pay | Admitting: Interventional Cardiology

## 2013-08-31 ENCOUNTER — Ambulatory Visit (INDEPENDENT_AMBULATORY_CARE_PROVIDER_SITE_OTHER): Payer: Medicare Other | Admitting: Pharmacist

## 2013-08-31 ENCOUNTER — Ambulatory Visit (INDEPENDENT_AMBULATORY_CARE_PROVIDER_SITE_OTHER): Payer: Medicare Other | Admitting: Interventional Cardiology

## 2013-08-31 VITALS — BP 135/70 | HR 80 | Ht 67.0 in | Wt 124.0 lb

## 2013-08-31 DIAGNOSIS — Z7901 Long term (current) use of anticoagulants: Secondary | ICD-10-CM

## 2013-08-31 DIAGNOSIS — Z5181 Encounter for therapeutic drug level monitoring: Secondary | ICD-10-CM

## 2013-08-31 DIAGNOSIS — Z950 Presence of cardiac pacemaker: Secondary | ICD-10-CM

## 2013-08-31 DIAGNOSIS — I4891 Unspecified atrial fibrillation: Secondary | ICD-10-CM

## 2013-08-31 DIAGNOSIS — R413 Other amnesia: Secondary | ICD-10-CM

## 2013-08-31 DIAGNOSIS — G3184 Mild cognitive impairment, so stated: Secondary | ICD-10-CM | POA: Insufficient documentation

## 2013-08-31 DIAGNOSIS — I5032 Chronic diastolic (congestive) heart failure: Secondary | ICD-10-CM

## 2013-08-31 DIAGNOSIS — I10 Essential (primary) hypertension: Secondary | ICD-10-CM

## 2013-08-31 LAB — POCT INR: INR: 1.4

## 2013-08-31 MED ORDER — DIGOXIN 125 MCG PO TABS: 0.0625 mg | ORAL_TABLET | Freq: Every day | ORAL | Status: DC

## 2013-08-31 NOTE — Progress Notes (Signed)
Patient ID: Charles Hendricks, male   DOB: Feb 02, 1925, 80 y.o.   MRN: [id number]    [address] [place], [place]  [address] Phone: [phone] Fax:  [phone]  Date:  08/31/2013   ID:  Charles Hendricks, DOB 04-28-1925, MRN [id number]  PCP:  Pearla Dubonnet, MD   ASSESSMENT:  1. Chronic atrial fibrillation 2. Chronic anticoagulation, noncompliant with followup 3. Complete heart block with permanent pacemaker 4. Hypertension 5. Chronic diastolic heart failure 6. Cognitive impairment, early/moderate dementia 7. Bilateral breast tenderness possibly related to digoxin  PLAN:  1. discontinue digoxin 2. INR will be checked today. He has had a difficult time following up in Coumadin clinic because of transportation and memory issues 3. On the last visit we tried to contact his granddaughters to see up that would be a possibility of help for him but we are not given affirmative response. They live nearly an hour away. He is actually doubled better than I anticipated now being by himself after the death of his wife 4. Clinical followup in 6 months   SUBJECTIVE: Charles Hendricks is a 80 y.o. male who is here for routine office visit. His major complaint is bilateral breast tenderness. He denies dyspnea. Is no lower extremity swelling as usual, there is confusion about which medications he is taking. Also get the sense on today's office visit that he may not be able to read. He denies chest pain. Is no orthopnea. He has not had syncope. Appetite is been stable. He is like himself out of his house on at least 2 occasions. Because of distance various, his granddaughters have not been able to help him in absence of his wife.   Wt Readings from Last 3 Encounters:  08/31/13 124 lb (56.246 kg)  06/07/13 121 lb (54.885 kg)  04/30/13 129 lb (58.514 kg)     Past Medical History  Diagnosis Date  . Bradycardia   . Atrial fibrillation   . Complete heart block   . HTN (hypertension),  benign   . Pacemaker-Medtronic     Current Outpatient Prescriptions  Medication Sig Dispense Refill  . digoxin (LANOXIN) 0.125 MG tablet Take 1 tablet (0.125 mg total) by mouth daily.  30 tablet  11  . furosemide (LASIX) 40 MG tablet Take 0.5 tablets (20 mg total) by mouth daily.      . potassium chloride SA (K-DUR,KLOR-CON) 20 MEQ tablet Take 1 tablet (20 mEq total) by mouth daily.  30 tablet  11  . warfarin (COUMADIN) 4 MG tablet Take 4 mg by mouth See admin instructions. Take one tablet everyday except 1/2 a tablet on Mondays and Wednesday       No current facility-administered medications for this visit.    Allergies:   No Known Allergies  Social History:  The patient  reports that he has quit smoking. He does not have any smokeless tobacco history on file. He reports that he does not drink alcohol.   ROS:  Please see the history of present illness.   Losing weight but says he has a good appetite   All other systems reviewed and negative.   OBJECTIVE: VS:  BP 135/70  Pulse 80  Ht 5\' 7"  (1.702 m)  Wt 124 lb (56.246 kg)  BMI 19.42 kg/m2 Well nourished, well developed, in no acute distress, slender HEENT: normal Neck: JVD flat. Carotid bruit absent  Cardiac:  normal S1, S2; IIRR; no murmur Lungs:  clear to auscultation bilaterally, no wheezing, rhonchi or  rales Abd: soft, nontender, no hepatomegaly Ext: Edema absent. Pulses 1+ bilateral Skin: warm and dry Neuro:  CNs 2-12 intact, no focal abnormalities noted  EKG:  Not repeated       Signed, Darci NeedleHenry W. B. Simmone Cape III, MD 08/31/2013 11:32 AM

## 2013-08-31 NOTE — Patient Instructions (Signed)
Your physician has recommended you make the following change in your medication:  1)REDUCE Digoxin to 1/2 tablet 0.0625mg  daily  Take all other medications listed on your medication list today  Your physician wants you to follow-up in: 6 months You will receive a reminder letter in the mail two months in advance. If you don't receive a letter, please call our office to schedule the follow-up appointment.

## 2013-08-31 NOTE — Telephone Encounter (Signed)
Addressed at ov today 

## 2013-09-06 ENCOUNTER — Ambulatory Visit: Payer: Medicare Other | Admitting: Interventional Cardiology

## 2013-09-07 ENCOUNTER — Ambulatory Visit: Payer: Medicare Other | Admitting: Interventional Cardiology

## 2013-09-30 ENCOUNTER — Ambulatory Visit: Payer: Medicare Other | Admitting: Interventional Cardiology

## 2013-10-07 ENCOUNTER — Emergency Department (HOSPITAL_COMMUNITY)
Admission: EM | Admit: 2013-10-07 | Discharge: 2013-10-08 | Disposition: A | Discharge status: Home / Self Care | Payer: Medicare Other | Attending: Emergency Medicine | Admitting: Emergency Medicine

## 2013-10-07 ENCOUNTER — Emergency Department (HOSPITAL_COMMUNITY): Payer: Medicare Other

## 2013-10-07 ENCOUNTER — Encounter (HOSPITAL_COMMUNITY): Payer: Self-pay | Admitting: Emergency Medicine

## 2013-10-07 DIAGNOSIS — Z87891 Personal history of nicotine dependence: Secondary | ICD-10-CM | POA: Insufficient documentation

## 2013-10-07 DIAGNOSIS — I10 Essential (primary) hypertension: Secondary | ICD-10-CM | POA: Insufficient documentation

## 2013-10-07 DIAGNOSIS — I4891 Unspecified atrial fibrillation: Secondary | ICD-10-CM | POA: Insufficient documentation

## 2013-10-07 DIAGNOSIS — D696 Thrombocytopenia, unspecified: Secondary | ICD-10-CM | POA: Insufficient documentation

## 2013-10-07 DIAGNOSIS — Z79899 Other long term (current) drug therapy: Secondary | ICD-10-CM | POA: Insufficient documentation

## 2013-10-07 DIAGNOSIS — Z950 Presence of cardiac pacemaker: Secondary | ICD-10-CM | POA: Insufficient documentation

## 2013-10-07 DIAGNOSIS — Z7901 Long term (current) use of anticoagulants: Secondary | ICD-10-CM | POA: Insufficient documentation

## 2013-10-07 DIAGNOSIS — R000 Tachycardia, unspecified: Secondary | ICD-10-CM | POA: Insufficient documentation

## 2013-10-07 LAB — COMPREHENSIVE METABOLIC PANEL
ALBUMIN: 3.6 g/dL (ref 3.5–5.2)
ALK PHOS: 94 U/L (ref 39–117)
ALT: 13 U/L (ref 0–53)
AST: 30 U/L (ref 0–37)
BILIRUBIN TOTAL: 2.3 mg/dL — AB (ref 0.3–1.2)
BUN: 31 mg/dL — ABNORMAL HIGH (ref 6–23)
CO2: 25 meq/L (ref 19–32)
Calcium: 9.2 mg/dL (ref 8.4–10.5)
Chloride: 101 mEq/L (ref 96–112)
Creatinine, Ser: 1.6 mg/dL — ABNORMAL HIGH (ref 0.50–1.35)
GFR calc Af Amer: 43 mL/min — ABNORMAL LOW (ref >90)
GFR, EST NON AFRICAN AMERICAN: 37 mL/min — AB (ref >90)
Glucose, Bld: 115 mg/dL — ABNORMAL HIGH (ref 70–99)
POTASSIUM: 4.1 meq/L (ref 3.7–5.3)
Sodium: 141 mEq/L (ref 137–147)
Total Protein: 7.4 g/dL (ref 6.0–8.3)

## 2013-10-07 LAB — CBC WITH DIFFERENTIAL/PLATELET
BASOS ABS: 0 10*3/uL (ref 0.0–0.1)
BASOS PCT: 0 % (ref 0–1)
Eosinophils Absolute: 0 10*3/uL (ref 0.0–0.7)
Eosinophils Relative: 0 % (ref 0–5)
HEMATOCRIT: 39 % (ref 39.0–52.0)
HEMOGLOBIN: 13.1 g/dL (ref 13.0–17.0)
Lymphocytes Relative: 6 % — ABNORMAL LOW (ref 12–46)
Lymphs Abs: 0.4 10*3/uL — ABNORMAL LOW (ref 0.7–4.0)
MCH: 31 pg (ref 26.0–34.0)
MCHC: 33.6 g/dL (ref 30.0–36.0)
MCV: 92.4 fL (ref 78.0–100.0)
MONOS PCT: 7 % (ref 3–12)
Monocytes Absolute: 0.5 10*3/uL (ref 0.1–1.0)
NEUTROS ABS: 6.2 10*3/uL (ref 1.7–7.7)
NEUTROS PCT: 87 % — AB (ref 43–77)
Platelets: 140 10*3/uL — ABNORMAL LOW (ref 150–400)
RBC: 4.22 MIL/uL (ref 4.22–5.81)
RDW: 14.6 % (ref 11.5–15.5)
WBC: 7.1 10*3/uL (ref 4.0–10.5)

## 2013-10-07 LAB — TROPONIN I

## 2013-10-07 LAB — DIGOXIN LEVEL

## 2013-10-07 NOTE — ED Notes (Signed)
Pt remains in A fib, rate in 80's90's irr. Family at bedside.
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
## 2013-10-08 LAB — PROTIME-INR
INR: 1.7 — AB (ref 0.00–1.49)
PROTHROMBIN TIME: 19.5 s — AB (ref 11.6–15.2)

## 2013-10-08 NOTE — ED Notes (Signed)
Pt alert, NAD, calm, interactive, resps e/u, speaking in clear complete sentences, VSS, lab at [facility], (denies: pain, sob, nausea, dizziness or other sx), "feels normal". Family at [facility] x2.

## 2013-10-08 NOTE — ED Notes (Signed)
EDP at BS 

## 2013-10-08 NOTE — Discharge Instructions (Signed)
Atrial Fibrillation Had your INR checked on Monday. Followup with Dr. Katrinka Blazing. Return to the ED if you develop new or worsening symptoms. Atrial fibrillation is a type of irregular heart rhythm (arrhythmia). During atrial fibrillation, the upper chambers of the heart (atria) quiver continuously in a chaotic pattern. This causes an irregular and often rapid heart rate.  Atrial fibrillation is the result of the heart becoming overloaded with disorganized signals that tell it to beat. These signals are normally released one at a time by a part of the right atrium called the sinoatrial node. They then travel from the atria to the lower chambers of the heart (ventricles), causing the atria and ventricles to contract and pump blood as they pass. In atrial fibrillation, parts of the atria outside of the sinoatrial node also release these signals. This results in two problems. First, the atria receive so many signals that they do not have time to fully contract. Second, the ventricles, which can only receive one signal at a time, beat irregularly and out of rhythm with the atria.  There are three types of atrial fibrillation:   Paroxysmal Paroxysmal atrial fibrillation starts suddenly and stops on its own within a week.   Persistent Persistent atrial fibrillation lasts for more than a week. It may stop on its own or with treatment.   Permanent Permanent atrial fibrillation does not go away. Episodes of atrial fibrillation may lead to permanent atrial fibrillation.  Atrial fibrillation can prevent your heart from pumping blood normally. It increases your risk of stroke and can lead to heart failure.  CAUSES   Heart conditions, including a heart attack, heart failure, coronary artery disease, and heart valve conditions.   Inflammation of the sac that surrounds the heart (pericarditis).   Blockage of an artery in the lungs (pulmonary embolism).   Pneumonia or other infections.   Chronic lung disease.    Thyroid problems, especially if the thyroid is overactive (hyperthyroidism).   Caffeine, excessive alcohol use, and use of some illegal drugs.   Use of some medications, including certain decongestants and diet pills.   Heart surgery.   Birth defects.  Sometimes, no cause can be found. When this happens, the atrial fibrillation is called lone atrial fibrillation. The risk of complications from atrial fibrillation increases if you have lone atrial fibrillation and you are age 91 years or older. RISK FACTORS  Heart failure.  Coronary artery disease  Diabetes mellitus.   High blood pressure (hypertension).   Obesity.   Other arrhythmias.   Increased age. SYMPTOMS   A feeling that your heart is beating rapidly or irregularly.   A feeling of discomfort or pain in your chest.   Shortness of breath.   Sudden lightheadedness or weakness.   Getting tired easily when exercising.   Urinating more often than normal (mainly when atrial fibrillation first begins).  In paroxysmal atrial fibrillation, symptoms may start and suddenly stop. DIAGNOSIS  Your caregiver may be able to detect atrial fibrillation when taking your pulse. Usually, testing is needed to diagnosis atrial fibrillation. Tests may include:   Electrocardiography. During this test, the electrical impulses of your heart are recorded while you are lying down.   Echocardiography. During echocardiography, sound waves are used to evaluate how blood flows through your heart.   Stress test. There is more than one type of stress test. If a stress test is needed, ask your caregiver about which type is best for you.   Chest X-ray exam.   Blood  tests.   Computed tomography (CT).  TREATMENT   Treating any underlying conditions. For example, if you have an overactive thyroid, treating the condition may correct atrial fibrillation.   Medication. Medications may be given to control a rapid heart  rate or to prevent blood clots, heart failure, or a stroke.   Procedure to correct the rhythm of the heart:  Electrical cardioversion. During electrical cardioversion, a controlled, low-energy shock is delivered to the heart through your skin. If you have chest pain, very low pressure blood pressure, or sudden heart failure, this procedure may need to be done as an emergency.  Catheter ablation. During this procedure, heart tissues that send the signals that cause atrial fibrillation are destroyed.  Maze or minimaze procedure. During this surgery, thin lines of heart tissue that carry the abnormal signals are destroyed. The maze procedure is an open-heart surgery. The minimaze procedure is a minimally invasive surgery. This means that small cuts are made to access the heart instead of a large opening.  Pulmonary venous isolation. During this surgery, tissue around the veins that carry blood from the lungs (pulmonary veins) is destroyed. This tissue is thought to carry the abnormal signals. HOME CARE INSTRUCTIONS   Take medications as directed by your caregiver.  Only take medications that your caregiver approves. Some medications can make atrial fibrillation worse or recur.  If blood thinners were prescribed by your caregiver, take them exactly as directed. Too much can cause bleeding. Too little and you will not have the needed protection against stroke and other problems.  Perform blood tests at home if directed by your caregiver.  Perform blood tests exactly as directed.   Quit smoking if you smoke.   Do not drink alcohol.   Do not drink caffeinated beverages such as coffee, soda, and some teas. You may drink decaffeinated coffee, soda, or tea.   Maintain a healthy weight. Do not use diet pills unless your caregiver approves. They may make heart problems worse.   Follow diet instructions as directed by your caregiver.   Exercise regularly as directed by your caregiver.    Keep all follow-up appointments. PREVENTION  The following substances can cause atrial fibrillation to recur:   Caffeinated beverages.   Alcohol.   Certain medications, especially those used for breathing problems.   Certain herbs and herbal medications, such as those containing ephedra or ginseng.  Illegal drugs such as cocaine and amphetamines. Sometimes medications are given to prevent atrial fibrillation from recurring. Proper treatment of any underlying condition is also important in helping prevent recurrence.  SEEK MEDICAL CARE IF:  You notice a change in the rate, rhythm, or strength of your heartbeat.   You suddenly begin urinating more frequently.   You tire more easily when exerting yourself or exercising.  SEEK IMMEDIATE MEDICAL CARE IF:   You develop chest pain, abdominal pain, sweating, or weakness.  You feel sick to your stomach (nauseous).  You develop shortness of breath.  You suddenly develop swollen feet and ankles.  You feel dizzy.  You face or limbs feel numb or weak.  There is a change in your vision or speech. MAKE SURE YOU:   Understand these instructions.  Will watch your condition.  Will get help right away if you are not doing well or get worse. Document Released: 04/28/2005 Document Revised: 08/23/2012 Document Reviewed: 06/08/2012 [facility] Patient Information 2014 [employer], [place].

## 2013-10-10 ENCOUNTER — Telehealth: Payer: Self-pay | Admitting: Interventional Cardiology

## 2013-10-10 NOTE — Telephone Encounter (Signed)
pt and pt sister aware cvrr appt scheduled for 10/11/13 @ 12pm.pt verbalizec understanding.

## 2013-10-10 NOTE — Telephone Encounter (Signed)
New problem   Pt was told by ER to been seen by Dr Katrinka Blazing within 2 days. Please call pt's sister.

## 2013-10-10 NOTE — Telephone Encounter (Signed)
returned pt sister call.adv her that pt needs to have his INR checked with the coumadin clinic and asap and an o/v with Dr.Smith can be sch later. called the pt 2x pt phone rings out.calle the pt sister back who is concerned about pt and says the pt is talking negatively, and telling her to tell family members goodbye if he doesn't make it.she sts that pt does not answer the phone sometimes even when she calls.she will call him until he picks up and instruct him to call the office

## 2013-10-11 ENCOUNTER — Ambulatory Visit (INDEPENDENT_AMBULATORY_CARE_PROVIDER_SITE_OTHER): Payer: Medicare Other | Admitting: Pharmacist

## 2013-10-11 DIAGNOSIS — I4891 Unspecified atrial fibrillation: Secondary | ICD-10-CM

## 2013-10-11 DIAGNOSIS — Z5181 Encounter for therapeutic drug level monitoring: Secondary | ICD-10-CM

## 2013-10-11 LAB — POCT INR: INR: 1.5

## 2013-10-22 ENCOUNTER — Encounter (HOSPITAL_COMMUNITY): Payer: Self-pay | Admitting: Emergency Medicine

## 2013-10-22 ENCOUNTER — Emergency Department (HOSPITAL_COMMUNITY)
Admission: EM | Admit: 2013-10-22 | Discharge: 2013-10-22 | Disposition: A | Discharge status: Home / Self Care | Payer: Medicare Other | Attending: Emergency Medicine | Admitting: Emergency Medicine

## 2013-10-22 ENCOUNTER — Emergency Department (HOSPITAL_COMMUNITY): Payer: Medicare Other

## 2013-10-22 DIAGNOSIS — I4891 Unspecified atrial fibrillation: Secondary | ICD-10-CM | POA: Insufficient documentation

## 2013-10-22 DIAGNOSIS — I509 Heart failure, unspecified: Secondary | ICD-10-CM | POA: Insufficient documentation

## 2013-10-22 DIAGNOSIS — Z87891 Personal history of nicotine dependence: Secondary | ICD-10-CM | POA: Insufficient documentation

## 2013-10-22 DIAGNOSIS — M7989 Other specified soft tissue disorders: Secondary | ICD-10-CM

## 2013-10-22 DIAGNOSIS — I10 Essential (primary) hypertension: Secondary | ICD-10-CM | POA: Insufficient documentation

## 2013-10-22 DIAGNOSIS — Z7901 Long term (current) use of anticoagulants: Secondary | ICD-10-CM | POA: Insufficient documentation

## 2013-10-22 DIAGNOSIS — Z79899 Other long term (current) drug therapy: Secondary | ICD-10-CM | POA: Insufficient documentation

## 2013-10-22 LAB — CBC
HCT: 35.9 % — ABNORMAL LOW (ref 39.0–52.0)
HEMOGLOBIN: 11.6 g/dL — AB (ref 13.0–17.0)
MCH: 29.9 pg (ref 26.0–34.0)
MCHC: 32.3 g/dL (ref 30.0–36.0)
MCV: 92.5 fL (ref 78.0–100.0)
Platelets: 360 10*3/uL (ref 150–400)
RBC: 3.88 MIL/uL — ABNORMAL LOW (ref 4.22–5.81)
RDW: 14.5 % (ref 11.5–15.5)
WBC: 6.4 10*3/uL (ref 4.0–10.5)

## 2013-10-22 LAB — BASIC METABOLIC PANEL
BUN: 44 mg/dL — ABNORMAL HIGH (ref 6–23)
CALCIUM: 9.3 mg/dL (ref 8.4–10.5)
CO2: 28 mEq/L (ref 19–32)
Chloride: 101 mEq/L (ref 96–112)
Creatinine, Ser: 1.51 mg/dL — ABNORMAL HIGH (ref 0.50–1.35)
GFR, EST AFRICAN AMERICAN: 46 mL/min — AB (ref >90)
GFR, EST NON AFRICAN AMERICAN: 40 mL/min — AB (ref >90)
Glucose, Bld: 111 mg/dL — ABNORMAL HIGH (ref 70–99)
Potassium: 4.2 mEq/L (ref 3.7–5.3)
SODIUM: 145 meq/L (ref 137–147)

## 2013-10-22 LAB — PRO B NATRIURETIC PEPTIDE: PRO B NATRI PEPTIDE: 3408 pg/mL — AB (ref 0–450)

## 2013-10-22 MED ORDER — FUROSEMIDE 10 MG/ML IJ SOLN
40.0000 mg | Freq: Once | INTRAMUSCULAR | Status: AC
  Administered 2013-10-22: 40 mg via INTRAVENOUS
  Filled 2013-10-22: qty 4

## 2013-10-22 MED ORDER — FUROSEMIDE 40 MG PO TABS: 80.0000 mg | ORAL_TABLET | Freq: Every day | ORAL | Status: DC

## 2013-10-22 NOTE — ED Notes (Signed)
Pt reports swelling to right leg x 1 week. Denies injury.

## 2013-10-22 NOTE — ED Notes (Signed)
Pt returned from xray

## 2013-10-22 NOTE — ED Notes (Signed)
Pt states when it hurts, it hurts a lot on the top of his right shin. Denies pain to left leg. Denies fever, N/V/D.

## 2013-10-22 NOTE — Progress Notes (Signed)
VASCULAR LAB PRELIMINARY  PRELIMINARY  PRELIMINARY  PRELIMINARY  Right lower extremity venous Doppler completed.    Preliminary report:  There is no DVT or SVT noted in the right lower extremity.   Breely Panik, RVT 10/22/2013, 5:06 PM

## 2013-10-22 NOTE — ED Provider Notes (Signed)
CSN: [id number]     Arrival date & time 10/22/13  1516 History   First MD Initiated Contact with Patient 10/22/13 1525     Chief Complaint  Patient presents with  . Leg Swelling    (Consider location/radiation/quality/duration/timing/severity/associated sxs/prior Treatment) Patient is a 80 y.o. male presenting with general illness.  Illness Location:  Right leg Quality:  Swelling Severity:  Severe Onset quality:  Gradual Duration:  2 weeks Timing:  Constant Progression:  Worsening Chronicity:  New Context:  N/a Ineffective treatments:  Increased dose of lasix Associated symptoms: cough   Associated symptoms: no abdominal pain, no chest pain, no congestion, no diarrhea, no fever, no headaches, no nausea, no rhinorrhea, no shortness of breath, no vomiting and no wheezing     Past Medical History  Diagnosis Date  . Bradycardia   . Atrial fibrillation   . Complete heart block   . HTN (hypertension), benign   . Pacemaker-Medtronic    Past Surgical History  Procedure Laterality Date  . Pacemaker insertion     Family History  Problem Relation Age of Onset  . Heart failure Mother    History  Substance Use Topics  . Smoking status: Former Games developermoker  . Smokeless tobacco: Not on file  . Alcohol Use: No    Review of Systems  Constitutional: Negative for fever and chills.  HENT: Negative for congestion and rhinorrhea.   Eyes: Negative for pain.  Respiratory: Positive for cough. Negative for shortness of breath and wheezing.   Cardiovascular: Positive for leg swelling (right > L). Negative for chest pain and palpitations.  Gastrointestinal: Negative for nausea, vomiting, abdominal pain, diarrhea and constipation.  Endocrine: Negative for polydipsia and polyuria.  Genitourinary: Negative for dysuria and flank pain.  Musculoskeletal: Negative for back pain and neck pain.  Skin: Negative for color change and wound.  Neurological: Negative for dizziness, numbness and headaches.       Allergies  Review of patient's allergies indicates no known allergies.  Home Medications   Prior to Admission medications   Medication Sig Start Date End Date Taking? Authorizing Provider  digoxin (LANOXIN) 0.125 MG tablet Take 0.125 mg by mouth daily.   Yes Historical Provider, MD  potassium chloride SA (K-DUR,KLOR-CON) 20 MEQ tablet Take 20 mEq by mouth daily.   Yes Historical Provider, MD  warfarin (COUMADIN) 4 MG tablet Take 2-4 mg by mouth daily. *Takes 4mg  daily except 2mg  on Mondays and Wednesdays*   Yes Historical Provider, MD  furosemide (LASIX) 40 MG tablet Take 2 tablets (80 mg total) by mouth daily. 10/22/13   Marily MemosJason Naiya Corral, MD   BP 131/85  Pulse 34  Temp(Src) 97.5 F (36.4 C) (Oral)  Resp 13  Wt 129 lb (58.514 kg)  SpO2 98% Physical Exam  Nursing note and vitals reviewed. Constitutional: He is oriented to person, place, and time. He appears well-developed and well-nourished.  HENT:  Head: Normocephalic and atraumatic.  Eyes: Conjunctivae and EOM are normal. Pupils are equal, round, and reactive to light.  Neck: Normal range of motion.  Cardiovascular: Normal rate and regular rhythm.   Pulmonary/Chest: Effort normal and breath sounds normal. He has no wheezes. He has no rales.  Abdominal: Soft. He exhibits no distension. There is no tenderness.  Musculoskeletal: Normal range of motion. He exhibits edema (2+ to the knee on right, 1+ to mid shin on left) and tenderness (all around right tib/fib area with enough pressure. nothing with ROM exercises).  Neurological: He is alert and oriented to  person, place, and time.  Skin: Skin is warm and dry.    ED Course  Procedures (including critical care time) Labs Review Labs Reviewed  PRO B NATRIURETIC PEPTIDE - Abnormal; Notable for the following:    Pro B Natriuretic peptide (BNP) 3408.0 (*)    All other components within normal limits  CBC - Abnormal; Notable for the following:    RBC 3.88 (*)    Hemoglobin 11.6  (*)    HCT 35.9 (*)    All other components within normal limits  BASIC METABOLIC PANEL - Abnormal; Notable for the following:    Glucose, Bld 111 (*)    BUN 44 (*)    Creatinine, Ser 1.51 (*)    GFR calc non Af Amer 40 (*)    GFR calc Af Amer 46 (*)    All other components within normal limits    Imaging Review Dg Knee 2 Views Right  10/22/2013   CLINICAL DATA:  Right knee swelling. Limited range of motion. No known injury.  EXAM: RIGHT KNEE - 1-2 VIEW  COMPARISON:  None.  FINDINGS: Early degenerative changes with joint space narrowing and minimal spurring. No joint effusion. No acute bony abnormality. Specifically, no fracture, subluxation, or dislocation. Soft tissues are intact.  IMPRESSION: Early degenerative changes.  No acute findings.   Electronically Signed   By: Charlett Nose M.D.   On: 10/22/2013 16:56     EKG Interpretation None      MDM   Final diagnoses:  CHF (congestive heart failure)  Leg swelling    80 yo M w/ h/o CHF, Afib presents with R>L leg edema for over a week unresponsive to increased dose of home lasix. No sob, intermittent non productive cough, no DOE or orthopnea. Unsure about weight gain. On exam, no distress, lungs diminished but clear. R>L swelling as above. Slight erythema around knee on right side. Minimal ttp all around swollen area.  Concern for DVT. Doubt septic arthritis. Will get dvt study and labs. If all are negative then likely chf exacerbation, will give IV dose of lasix here and FU w/ cards.   Labs and imaging as expected. No dvt. Likely chf exacerbation. BNP similar to prior. No respiratory symptoms. Renal function at baseline. Will give IV lasix, increase home dose for 5 days and d/c to FU w/ cards Monday.    Marily Memos, MD 10/23/13 [phone]

## 2013-10-23 NOTE — ED Provider Notes (Signed)
I saw and evaluated the patient, reviewed the resident's note and I agree with the findings and plan.   EKG Interpretation None      Pt with leg swelling R>L without signs of cellulitis or septic joint.  Hx of chf in the past and doppler neg.  BNP elevated and pt given lasix.  Pt safe for d/c and f/u with pcp  Gwyneth Sprout, MD 10/23/13 1601

## 2013-10-31 ENCOUNTER — Ambulatory Visit: Payer: Self-pay | Admitting: Pharmacist

## 2013-10-31 ENCOUNTER — Ambulatory Visit (INDEPENDENT_AMBULATORY_CARE_PROVIDER_SITE_OTHER): Payer: Medicare Other | Admitting: Interventional Cardiology

## 2013-10-31 ENCOUNTER — Telehealth: Payer: Self-pay

## 2013-10-31 ENCOUNTER — Encounter: Payer: Self-pay | Admitting: Interventional Cardiology

## 2013-10-31 VITALS — BP 120/62 | HR 51 | Ht 67.0 in | Wt 118.0 lb

## 2013-10-31 DIAGNOSIS — I482 Chronic atrial fibrillation, unspecified: Secondary | ICD-10-CM

## 2013-10-31 DIAGNOSIS — Z950 Presence of cardiac pacemaker: Secondary | ICD-10-CM

## 2013-10-31 DIAGNOSIS — I4891 Unspecified atrial fibrillation: Secondary | ICD-10-CM

## 2013-10-31 DIAGNOSIS — Z5181 Encounter for therapeutic drug level monitoring: Secondary | ICD-10-CM

## 2013-10-31 DIAGNOSIS — I10 Essential (primary) hypertension: Secondary | ICD-10-CM

## 2013-10-31 DIAGNOSIS — Z7901 Long term (current) use of anticoagulants: Secondary | ICD-10-CM

## 2013-10-31 MED ORDER — ASPIRIN EC 81 MG PO TBEC: 81.0000 mg | DELAYED_RELEASE_TABLET | Freq: Every day | ORAL | Status: DC
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
## 2013-10-31 NOTE — Telephone Encounter (Signed)
Called pt pharmacy [facility] and spoke with Kingsley Callander her that Charles Hendricks has been d/c by Dr.Smith because of non compliance due to cognitive impairment. Adv her to cancel Rx and do not refill.

## 2013-10-31 NOTE — Patient Instructions (Signed)
Your physician has recommended you make the following change in your medication:  1) STOP Coumadin 2) START Aspirin 81mg  daily  Take all other medications as prescribed  Your physician wants you to follow-up in: 6 months with Dr.Smith You will receive a reminder letter in the mail two months in advance. If you don't receive a letter, please call our office to schedule the follow-up appointment.

## 2013-11-01 ENCOUNTER — Ambulatory Visit: Payer: Medicare Other | Admitting: Interventional Cardiology

## 2013-11-03 ENCOUNTER — Encounter: Payer: Self-pay | Admitting: *Deleted

## 2013-11-08 ENCOUNTER — Telehealth: Payer: Self-pay

## 2013-11-09 ENCOUNTER — Encounter (HOSPITAL_COMMUNITY): Payer: Self-pay | Admitting: Emergency Medicine

## 2013-11-09 ENCOUNTER — Emergency Department (HOSPITAL_COMMUNITY)
Admission: EM | Admit: 2013-11-09 | Discharge: 2013-11-09 | Disposition: A | Discharge status: Home / Self Care | Payer: Medicare Other | Attending: Emergency Medicine | Admitting: Emergency Medicine

## 2013-11-09 ENCOUNTER — Emergency Department (HOSPITAL_COMMUNITY): Payer: Medicare Other

## 2013-11-09 DIAGNOSIS — Z8639 Personal history of other endocrine, nutritional and metabolic disease: Secondary | ICD-10-CM | POA: Insufficient documentation

## 2013-11-09 DIAGNOSIS — I4891 Unspecified atrial fibrillation: Secondary | ICD-10-CM | POA: Insufficient documentation

## 2013-11-09 DIAGNOSIS — Z7982 Long term (current) use of aspirin: Secondary | ICD-10-CM | POA: Insufficient documentation

## 2013-11-09 DIAGNOSIS — Z862 Personal history of diseases of the blood and blood-forming organs and certain disorders involving the immune mechanism: Secondary | ICD-10-CM | POA: Insufficient documentation

## 2013-11-09 DIAGNOSIS — Z87891 Personal history of nicotine dependence: Secondary | ICD-10-CM | POA: Insufficient documentation

## 2013-11-09 DIAGNOSIS — F29 Unspecified psychosis not due to a substance or known physiological condition: Secondary | ICD-10-CM

## 2013-11-09 DIAGNOSIS — F0789 Other personality and behavioral disorders due to known physiological condition: Secondary | ICD-10-CM | POA: Insufficient documentation

## 2013-11-09 DIAGNOSIS — Z7901 Long term (current) use of anticoagulants: Secondary | ICD-10-CM | POA: Insufficient documentation

## 2013-11-09 DIAGNOSIS — Z872 Personal history of diseases of the skin and subcutaneous tissue: Secondary | ICD-10-CM | POA: Insufficient documentation

## 2013-11-09 DIAGNOSIS — R413 Other amnesia: Secondary | ICD-10-CM

## 2013-11-09 DIAGNOSIS — I10 Essential (primary) hypertension: Secondary | ICD-10-CM | POA: Insufficient documentation

## 2013-11-09 DIAGNOSIS — F062 Psychotic disorder with delusions due to known physiological condition: Secondary | ICD-10-CM | POA: Insufficient documentation

## 2013-11-09 DIAGNOSIS — G3184 Mild cognitive impairment, so stated: Secondary | ICD-10-CM | POA: Insufficient documentation

## 2013-11-09 DIAGNOSIS — Z950 Presence of cardiac pacemaker: Secondary | ICD-10-CM | POA: Insufficient documentation

## 2013-11-09 DIAGNOSIS — Z79899 Other long term (current) drug therapy: Secondary | ICD-10-CM | POA: Insufficient documentation

## 2013-11-09 LAB — URINALYSIS, ROUTINE W REFLEX MICROSCOPIC
Bilirubin Urine: NEGATIVE
GLUCOSE, UA: NEGATIVE mg/dL
HGB URINE DIPSTICK: NEGATIVE
Ketones, ur: NEGATIVE mg/dL
LEUKOCYTES UA: NEGATIVE
Nitrite: NEGATIVE
PH: 5 (ref 5.0–8.0)
Protein, ur: NEGATIVE mg/dL
Specific Gravity, Urine: 1.008 (ref 1.005–1.030)
Urobilinogen, UA: 0.2 mg/dL (ref 0.0–1.0)

## 2013-11-09 LAB — CBC WITH DIFFERENTIAL/PLATELET
Basophils Absolute: 0 10*3/uL (ref 0.0–0.1)
Basophils Relative: 0 % (ref 0–1)
EOS ABS: 0.1 10*3/uL (ref 0.0–0.7)
Eosinophils Relative: 3 % (ref 0–5)
HCT: 34 % — ABNORMAL LOW (ref 39.0–52.0)
Hemoglobin: 11.1 g/dL — ABNORMAL LOW (ref 13.0–17.0)
LYMPHS ABS: 0.7 10*3/uL (ref 0.7–4.0)
Lymphocytes Relative: 16 % (ref 12–46)
MCH: 29.4 pg (ref 26.0–34.0)
MCHC: 32.6 g/dL (ref 30.0–36.0)
MCV: 90.2 fL (ref 78.0–100.0)
Monocytes Absolute: 0.5 10*3/uL (ref 0.1–1.0)
Monocytes Relative: 10 % (ref 3–12)
NEUTROS ABS: 3.3 10*3/uL (ref 1.7–7.7)
NEUTROS PCT: 71 % (ref 43–77)
PLATELETS: 177 10*3/uL (ref 150–400)
RBC: 3.77 MIL/uL — AB (ref 4.22–5.81)
RDW: 15 % (ref 11.5–15.5)
WBC: 4.6 10*3/uL (ref 4.0–10.5)

## 2013-11-09 LAB — COMPREHENSIVE METABOLIC PANEL
ALBUMIN: 3 g/dL — AB (ref 3.5–5.2)
ALK PHOS: 105 U/L (ref 39–117)
ALT: 10 U/L (ref 0–53)
AST: 20 U/L (ref 0–37)
Anion gap: 12 (ref 5–15)
BUN: 25 mg/dL — ABNORMAL HIGH (ref 6–23)
CO2: 29 mEq/L (ref 19–32)
Calcium: 8.6 mg/dL (ref 8.4–10.5)
Chloride: 99 mEq/L (ref 96–112)
Creatinine, Ser: 1.6 mg/dL — ABNORMAL HIGH (ref 0.50–1.35)
GFR calc Af Amer: 43 mL/min — ABNORMAL LOW (ref >90)
GFR calc non Af Amer: 37 mL/min — ABNORMAL LOW (ref >90)
Glucose, Bld: 105 mg/dL — ABNORMAL HIGH (ref 70–99)
Potassium: 4 mEq/L (ref 3.7–5.3)
SODIUM: 140 meq/L (ref 137–147)
TOTAL PROTEIN: 7.3 g/dL (ref 6.0–8.3)
Total Bilirubin: 0.7 mg/dL (ref 0.3–1.2)

## 2013-11-09 LAB — PROTIME-INR
INR: 1.12 (ref 0.00–1.49)
Prothrombin Time: 14.4 seconds (ref 11.6–15.2)

## 2013-11-09 LAB — RAPID URINE DRUG SCREEN, HOSP PERFORMED
AMPHETAMINES: NOT DETECTED
Barbiturates: NOT DETECTED
Benzodiazepines: NOT DETECTED
COCAINE: NOT DETECTED
OPIATES: NOT DETECTED
Tetrahydrocannabinol: NOT DETECTED

## 2013-11-09 LAB — ACETAMINOPHEN LEVEL

## 2013-11-09 LAB — I-STAT TROPONIN, ED: Troponin i, poc: 0.04 ng/mL (ref 0.00–0.08)

## 2013-11-09 LAB — ETHANOL: Alcohol, Ethyl (B): <11 mg/dL (ref 0–11)

## 2013-11-09 LAB — DIGOXIN LEVEL

## 2013-11-09 LAB — SALICYLATE LEVEL: Salicylate Lvl: <2 mg/dL — ABNORMAL LOW (ref 2.8–20.0)

## 2013-11-09 MED ORDER — ASPIRIN EC 81 MG PO TBEC: 81.0000 mg | DELAYED_RELEASE_TABLET | Freq: Every day | ORAL | Status: DC

## 2013-11-09 MED ORDER — FUROSEMIDE 40 MG PO TABS
80.0000 mg | ORAL_TABLET | Freq: Every day | ORAL | Status: DC
  Administered 2013-11-09: 80 mg via ORAL
  Filled 2013-11-09: qty 2

## 2013-11-09 MED ORDER — ACETAMINOPHEN 325 MG PO TABS: 650.0000 mg | ORAL_TABLET | ORAL | Status: DC | PRN

## 2013-11-09 MED ORDER — IBUPROFEN 200 MG PO TABS: 600.0000 mg | ORAL_TABLET | Freq: Three times a day (TID) | ORAL | Status: DC | PRN

## 2013-11-09 MED ORDER — POTASSIUM CHLORIDE CRYS ER 20 MEQ PO TBCR
20.0000 meq | EXTENDED_RELEASE_TABLET | Freq: Every day | ORAL | Status: DC
  Administered 2013-11-09: 20 meq via ORAL
  Filled 2013-11-09: qty 1

## 2013-11-09 MED ORDER — WARFARIN - PHYSICIAN DOSING INPATIENT: Freq: Every day | Status: DC

## 2013-11-09 MED ORDER — WARFARIN SODIUM 4 MG PO TABS
4.0000 mg | ORAL_TABLET | Freq: Every day | ORAL | Status: DC
  Filled 2013-11-09: qty 1

## 2013-11-09 MED ORDER — LORAZEPAM 1 MG PO TABS
1.0000 mg | ORAL_TABLET | Freq: Three times a day (TID) | ORAL | Status: DC | PRN
Start: 2013-11-09 — End: 2013-11-09

## 2013-11-09 NOTE — ED Notes (Signed)
Provided pt with urinal. Pt will notify when he voids
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
## 2013-11-09 NOTE — ED Notes (Addendum)
MD at bedside. 

## 2013-11-09 NOTE — ED Notes (Signed)
Patient transported to CT 
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
## 2013-11-09 NOTE — ED Notes (Signed)
Psych Team in with patient. 
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
## 2013-11-09 NOTE — ED Notes (Signed)
MD Yao at bedside 

## 2013-11-09 NOTE — Consult Note (Signed)
Face to face evaluation and I agree with this note 
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
## 2013-11-09 NOTE — Discharge Instructions (Signed)
Home care will contact you and you will be set up for home health, nurse, aid.   Take your meds as prescribed.   Make sure that there are no guns, sharp objects in the house that you can use to harm yourself.   Follow up with your doctor. You may need neuropsych testing with your doctor or he can refer you to a specialist.   Return to ER if you have worsening hallucinations, harming yourself or others.

## 2013-11-09 NOTE — ED Notes (Signed)
Case Management at bedside.

## 2013-11-10 NOTE — Progress Notes (Signed)
11/10/13 1537 ED Cm called pt to f/u with him left a voice message at home [phone],  for [facility] [phone] Katrinka Blazing and for Cecilie Kicks [phone] Pending a return call with choice

## 2013-11-11 NOTE — Progress Notes (Addendum)
11/11/13 1556 Spoke with  Archie Patten No further needs or clinical information is needed by [place] for pt at this time.   1339 CM updated Adriane about the call from [facility] home health staff changes, Available HHRN but no aide. Adriane states "This is great." and reports they are ok with not having an aide and social worker if not available States [facility]  has permission to call her prn Confirms for the second time that the guns are not in the home 1306 ED Cm received a call from [place]. Faxed clinicals received. Clarification with Archie Patten that family confirmed with ED CM, Kim that Adriane, grand daughter removed all guns from the home.  Clarification of HHRN order. Discussed available home health services for pt, no aide or sw if not available  1214 Cm updated Adriane informing her that [facility] & [facility]) staff states they no longer have specialized BH RNs at this time therefore can not accept the referral.  Pt clinicals faxed to [place] with fax confirmation received at 1151. Cm spoke with Archie Patten (on call) at [facility] to review the referral. Cm left voice messages for Debbie B & Philis Fendt also Genevieve Norlander staff member to review case.  Pending insurance confirmation. Pending return calls from [facility] and [facility] 1113 ED Cm received a call from Adriane to update her on [facility]'s response to referral. She agree to have CM check with 3 other agencies ([facility], [place] & [facility]) and to have any available agency assist. She confirms pt guns have been removed from his home and his is doing well. The family continues to call to make sure he takes his medicine. Pt has given permission during 11/09/13 ED visit for Cm to call his granddaughters (Adriane and Stark Klein) and any of the [facility] agencies can updated them also per Mr Penn Alkins ED CM received a voice message left by Cecilie Kicks [phone]) at 1615 on 11/10/13 stating choice of home health agency is [facility]. ED CM attempted  to fax clinicals (face sheet, HH orders for RN, aide, SW with face to face encounter, EDP progress notes and CM progress notes) to [facility] but informed by Maralyn Sago not to fax them until she consulted with her manager for confirmation they could accept pt and if they did they would not be able to see pt until Monday after the July 4th holiday  0943 Left a voice message for Adriane to inform of call information with Maralyn Sago of [facility] about start date and pending acceptance of pt referral Left CM mobile number for a return call from Adriane to discuss this

## 2013-11-14 NOTE — Progress Notes (Addendum)
1118 ED CM received a call from Charles Hendricks stating pt is doing "fine" She is trying to obtain [facility] resources for pt and will updated CM.  She will look at [facility] and see pt is interested.  She was made aware that PACE, PDN, etc may be out of pocket expense.  She was informed of call from & to [facility] & [facility] respectivefully and states "it's ok". Discussed call placed to [employer].  Charles Hendricks to return a call if further needs or responses  0955 ED CM called Charles Marden Noble office at [phone],option 2 Left a voice message for Charles Hendricks Charles gates Rn Pending a return call  [phone] ED Cm called and spoke with Charles Hendricks at [facility].  Reviewed needs with Charles Hendricks again who inquired if the pt was "the mental patient" Cm informed her pt was evaluated for mental concerns "yes". Sarah informed ED CM that her supervisor, Charles Hendricks, informed her the pt needs to be referred by his pcp Charles Marden Noble for [facility] for medication management   [phone] ED CM updated Charles Hendricks, granddaughter plus have not heard back from [facility].  Cm reviewed pt for [facility] referral Not meeting criteria even though pt's pcp is Charles Marden Noble, No 2 admission in last 6 months no CHF, COPD, Asthma, Etc Recommend to [employer] duty, [place] services or possibly PACE Requested return call from her to [facility] mobile  At 0926 [facility] ED Cm received a call from Charles Hendricks [phone] stating Charles Hendricks has determined pt "will not be able to accept" pt for their [facility] RN services related him being "too high risk" Recommends "twenty four hour care" and [facility] RN would not have been able to see pt until" late in the week"

## 2013-11-16 NOTE — Telephone Encounter (Signed)
error 

## 2014-02-14 ENCOUNTER — Encounter: Payer: Self-pay | Admitting: Physician Assistant

## 2014-02-14 ENCOUNTER — Ambulatory Visit (INDEPENDENT_AMBULATORY_CARE_PROVIDER_SITE_OTHER): Payer: Medicare Other | Admitting: *Deleted

## 2014-02-14 ENCOUNTER — Ambulatory Visit (INDEPENDENT_AMBULATORY_CARE_PROVIDER_SITE_OTHER): Payer: Medicare Other | Admitting: Physician Assistant

## 2014-02-14 VITALS — BP 120/78 | HR 90 | Resp 14

## 2014-02-14 DIAGNOSIS — R4189 Other symptoms and signs involving cognitive functions and awareness: Secondary | ICD-10-CM

## 2014-02-14 DIAGNOSIS — I482 Chronic atrial fibrillation, unspecified: Secondary | ICD-10-CM

## 2014-02-14 DIAGNOSIS — I5042 Chronic combined systolic (congestive) and diastolic (congestive) heart failure: Secondary | ICD-10-CM

## 2014-02-14 DIAGNOSIS — R531 Weakness: Secondary | ICD-10-CM

## 2014-02-14 DIAGNOSIS — R55 Syncope and collapse: Secondary | ICD-10-CM

## 2014-02-14 DIAGNOSIS — I442 Atrioventricular block, complete: Secondary | ICD-10-CM

## 2014-02-14 LAB — PROTIME-INR
INR: 1.1 ratio — ABNORMAL HIGH (ref 0.8–1.0)
Prothrombin Time: 12.3 s (ref 9.6–13.1)

## 2014-02-14 LAB — CBC WITH DIFFERENTIAL/PLATELET
BASOS PCT: 0 % (ref 0.0–3.0)
Basophils Absolute: 0 10*3/uL (ref 0.0–0.1)
Eosinophils Absolute: 0.1 10*3/uL (ref 0.0–0.7)
Eosinophils Relative: 1.9 % (ref 0.0–5.0)
HCT: 38.8 % — ABNORMAL LOW (ref 39.0–52.0)
Hemoglobin: 12.8 g/dL — ABNORMAL LOW (ref 13.0–17.0)
Lymphocytes Relative: 8.7 % — ABNORMAL LOW (ref 12.0–46.0)
Lymphs Abs: 0.5 10*3/uL — ABNORMAL LOW (ref 0.7–4.0)
MCHC: 33 g/dL (ref 30.0–36.0)
MCV: 95.4 fl (ref 78.0–100.0)
MONO ABS: 0.5 10*3/uL (ref 0.1–1.0)
Monocytes Relative: 8.9 % (ref 3.0–12.0)
Neutro Abs: 4.6 10*3/uL (ref 1.4–7.7)
Neutrophils Relative %: 80.5 % — ABNORMAL HIGH (ref 43.0–77.0)
Platelets: 197 10*3/uL (ref 150.0–400.0)
RBC: 4.07 Mil/uL — ABNORMAL LOW (ref 4.22–5.81)
RDW: 14.7 % (ref 11.5–15.5)
WBC: 5.8 10*3/uL (ref 4.0–10.5)

## 2014-02-14 LAB — COMPREHENSIVE METABOLIC PANEL
ALBUMIN: 4.1 g/dL (ref 3.5–5.2)
ALK PHOS: 77 U/L (ref 39–117)
ALT: 15 U/L (ref 0–53)
AST: 25 U/L (ref 0–37)
BUN: 39 mg/dL — AB (ref 6–23)
CO2: 36 mEq/L — ABNORMAL HIGH (ref 19–32)
Calcium: 9.5 mg/dL (ref 8.4–10.5)
Chloride: 94 mEq/L — ABNORMAL LOW (ref 96–112)
Creatinine, Ser: 1.9 mg/dL — ABNORMAL HIGH (ref 0.4–1.5)
GFR: 43.82 mL/min — ABNORMAL LOW (ref >60.00)
Glucose, Bld: 129 mg/dL — ABNORMAL HIGH (ref 70–99)
POTASSIUM: 3.7 meq/L (ref 3.5–5.1)
Sodium: 139 mEq/L (ref 135–145)
Total Bilirubin: 1.8 mg/dL — ABNORMAL HIGH (ref 0.2–1.2)
Total Protein: 8.2 g/dL (ref 6.0–8.3)

## 2014-02-14 LAB — TSH: TSH: 2.95 u[IU]/mL (ref 0.35–4.50)

## 2014-02-14 NOTE — Addendum Note (Signed)
Addended by: Judy Pimple on: 02/14/2014 04:01 PM   Modules accepted: Orders
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
## 2014-02-14 NOTE — Patient Instructions (Addendum)
**Note De-Identified  Obfuscation** Your physician has recommended you make the following change in your medication: HOLD LASIX UNTIL SEEN BY [place] IN OFFICE ON THURSDAY 02/16/14.  Your physician recommends that you return for lab work in: today (CMET, CBCD, TSH, INR and Dig level)  Your physician recommends that you schedule a follow-up appointment on: February 16, 2014 at 10:30 with Ronie Spies, [facility]  Please return to office if your symptoms recur or worsen if during business hours and if not call 911.  DO NOT DRIVE UNDER ANY CIRCUMSTANCE!!!!

## 2014-02-15 ENCOUNTER — Telehealth: Payer: Self-pay | Admitting: *Deleted

## 2014-02-15 LAB — DIGOXIN LEVEL: Digoxin Level: 1.2 ng/mL (ref 0.8–2.0)

## 2014-02-15 MED ORDER — DIGOXIN 125 MCG PO TABS: 0.0625 mg | ORAL_TABLET | Freq: Every day | ORAL | Status: DC

## 2014-02-15 NOTE — Telephone Encounter (Signed)
no answer at pt's phone. I called his sister Bernice, she ssaid pt will not answer his phone. Bernice was given lab results and med change that pt needs to decrease digoxin to 1/2 tab daily = 0.0625 mg. Bernice said she will tell pt but states "he is very mean to her, always yelling at her". I reminded Bernice that pt does have an appt tomorrow with Dayna Dunn, PA @ 10:30 02/16/14 

## 2014-02-15 NOTE — Telephone Encounter (Signed)
no answer at pt's phone. I called his sister Merdis Delay, she ssaid pt will not answer his phone. Merdis Delay was given lab results and med change that pt needs to decrease digoxin to 1/2 tab daily = 0.0625 mg. Merdis Delay said she will tell pt but states "he is very mean to her, always yelling at her". I reminded Merdis Delay that pt does have an appt tomorrow with Ronie Spies, PA @ 10:30 02/16/14
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
## 2014-02-16 ENCOUNTER — Telehealth: Payer: Self-pay | Admitting: Physician Assistant

## 2014-02-16 ENCOUNTER — Encounter: Payer: Self-pay | Admitting: Physician Assistant

## 2014-02-16 ENCOUNTER — Encounter: Payer: Medicare Other | Admitting: Physician Assistant

## 2014-02-16 DIAGNOSIS — R531 Weakness: Secondary | ICD-10-CM | POA: Insufficient documentation

## 2014-02-16 DIAGNOSIS — N183 Chronic kidney disease, stage 3 unspecified: Secondary | ICD-10-CM | POA: Insufficient documentation

## 2014-02-16 NOTE — Telephone Encounter (Signed)
called to see if pt sister was able to get in contact with pt.pt sister phone rings out. unable to lmom. will attempt again later

## 2014-02-16 NOTE — Progress Notes (Signed)
  This encounter was created in error - please disregard. The patient cancelled his visit.

## 2014-02-16 NOTE — Telephone Encounter (Signed)
called to ck on pt and to reschedule his appt he cancelled.pt has no voicemail. pt phone rings out unable to lmom.

## 2014-02-16 NOTE — Telephone Encounter (Signed)
[name] called and cancelled his appointment with me today. The note says "pt states he is OK." The next appointment on the books isn't until December! Misty Stanley, since you know him well, can you please give him a call? He needs to be called back and the importance of follow-up stressed since we have changed some of his medications (we held Lasix until further notice and reduced digoxin dose). Please call him to offer him another appointment later today with me or tomorrow in [place] with Norma Fredrickson. Please stress to him that it is NECESSARY that he follow up because otherwise we will not know what to do next with his medications. If he refuses, please explain to him how dangerous it is to NOT follow up and document this. We are only trying to look out for him. Dayna Dunn PA-C

## 2014-02-16 NOTE — Telephone Encounter (Signed)
called pt X2 pt phone rings out unable to lmom

## 2014-02-17 NOTE — Telephone Encounter (Signed)
We should dispatch social services to the patient's home. He needs help. I believe he is illiterate. He may need to be in a more protected environment.

## 2014-02-18 NOTE — Telephone Encounter (Signed)
Charles Hendricks has contacted adult protective services and he has been assigned a Futures trader. Chivonne Rascon PA-C

## 2014-02-20 LAB — MDC_IDC_ENUM_SESS_TYPE_INCLINIC
Lead Channel Setting Pacing Amplitude: 2 V
Lead Channel Setting Pacing Pulse Width: 0.46 ms
Lead Channel Setting Sensing Sensitivity: 2 mV

## 2014-02-20 NOTE — Progress Notes (Signed)
Pacemaker interrogation in clinic. Pt had near syncopal episode in elevator. Normal device function. 127 ventricular high rate episodes--longest was 20 seconds. None of the episodes correlating with near syncopal episode. Follow up as planned.

## 2014-03-23 ENCOUNTER — Encounter: Payer: Self-pay | Admitting: Internal Medicine

## 2014-04-20 ENCOUNTER — Encounter (HOSPITAL_COMMUNITY): Payer: Self-pay | Admitting: Internal Medicine

## 2014-04-26 ENCOUNTER — Ambulatory Visit: Payer: Medicare Other | Admitting: Interventional Cardiology

## 2014-06-09 ENCOUNTER — Other Ambulatory Visit: Payer: Self-pay

## 2014-06-09 MED ORDER — DIGOXIN 125 MCG PO TABS: 0.0625 mg | ORAL_TABLET | Freq: Every day | ORAL | Status: DC

## 2014-06-12 ENCOUNTER — Other Ambulatory Visit: Payer: Self-pay | Admitting: *Deleted

## 2014-06-12 ENCOUNTER — Ambulatory Visit (INDEPENDENT_AMBULATORY_CARE_PROVIDER_SITE_OTHER): Payer: Medicare Other

## 2014-06-12 VITALS — BP 136/58 | HR 78 | Resp 16 | Wt 120.0 lb

## 2014-06-12 DIAGNOSIS — M79671 Pain in right foot: Secondary | ICD-10-CM

## 2014-06-12 DIAGNOSIS — M79672 Pain in left foot: Secondary | ICD-10-CM

## 2014-06-12 DIAGNOSIS — M542 Cervicalgia: Secondary | ICD-10-CM

## 2014-06-12 MED ORDER — FUROSEMIDE 40 MG PO TABS: 80.0000 mg | ORAL_TABLET | Freq: Every day | ORAL | Status: DC

## 2014-06-12 NOTE — Patient Instructions (Addendum)
Patient needs to follow up with PCP or Urgent Care. Patient will see Dr. Katrinka Blazing on 05/23/2014.

## 2014-06-12 NOTE — Progress Notes (Signed)
1.) Reason for visit: Patient c/o pain in his left side of neck and the bottom of his feet.  2.) Name of MD requesting visit: patient was a walk-in  3.) H&P: Patient has history of chronic atrial fibrillation, CHF, HTN, and pacemaker.  4.) ROS related to problem: Patient's BP 136/58, HR 78, Resp. 16, and Temp. 97.3. Unable to get a clear reading on O2 level. Patient is alert and oriented x 2. Patient could not tell me today's date. Patient is with his spouse, who is answer most of his questions. Patient states his left side of his neck hurts when he turns it to the left. Patient has pain on the bottom of his feet and it hurts most when he walks. Patient wanted to see Dr. Katrinka Blazing, but he is not in the office today. Patient does not want to see anyone else, and patient does not seem approriate to see a cardiologist, looks like patient needs to see PCP.  5.) Follow up with PCP or go to urgent care.

## 2014-06-13 ENCOUNTER — Other Ambulatory Visit: Payer: Self-pay

## 2014-06-13 MED ORDER — DIGOXIN 125 MCG PO TABS: 0.0625 mg | ORAL_TABLET | Freq: Every day | ORAL | Status: DC

## 2014-06-13 MED ORDER — FUROSEMIDE 40 MG PO TABS: 80.0000 mg | ORAL_TABLET | Freq: Every day | ORAL | Status: DC

## 2014-06-23 ENCOUNTER — Ambulatory Visit: Payer: Medicare Other | Admitting: Interventional Cardiology

## 2014-06-27 ENCOUNTER — Encounter: Payer: Medicare Other | Admitting: Internal Medicine

## 2014-06-30 ENCOUNTER — Telehealth: Payer: Self-pay | Admitting: Interventional Cardiology

## 2014-06-30 NOTE — Telephone Encounter (Signed)
Spoke with pt and he was wanting to know if we had received his $60 payment. Spoke with DJ in billing and she was unable to find payment in system. Informed pt of this and advised him to also call his bank and find out if the check has cleared or is pending. Pt verbalized understanding and was in agreement with this plan.

## 2014-06-30 NOTE — Telephone Encounter (Signed)
New Message  Pt calling to speak w/ Rn. Please call back and discuss.  

## 2014-07-11 ENCOUNTER — Encounter: Payer: Self-pay | Admitting: Interventional Cardiology

## 2014-07-15 ENCOUNTER — Encounter (HOSPITAL_COMMUNITY): Payer: Self-pay | Admitting: Emergency Medicine

## 2014-07-15 ENCOUNTER — Inpatient Hospital Stay (HOSPITAL_COMMUNITY)
Admission: EM | Admit: 2014-07-15 | Discharge: 2014-07-19 | DRG: 292 | Disposition: A | Discharge status: Home Health | Payer: Medicare Other | Attending: Cardiology | Admitting: Cardiology

## 2014-07-15 ENCOUNTER — Emergency Department (HOSPITAL_COMMUNITY): Payer: Medicare Other

## 2014-07-15 ENCOUNTER — Emergency Department (HOSPITAL_COMMUNITY)
Admission: EM | Admit: 2014-07-15 | Discharge: 2014-07-15 | Disposition: A | Discharge status: Nursing Home (Includes ICF) | Payer: Medicare Other | Source: Home / Self Care | Attending: Family Medicine | Admitting: Family Medicine

## 2014-07-15 DIAGNOSIS — I472 Ventricular tachycardia: Secondary | ICD-10-CM | POA: Diagnosis present

## 2014-07-15 DIAGNOSIS — R0602 Shortness of breath: Secondary | ICD-10-CM

## 2014-07-15 DIAGNOSIS — N183 Chronic kidney disease, stage 3 (moderate): Secondary | ICD-10-CM | POA: Diagnosis present

## 2014-07-15 DIAGNOSIS — I482 Chronic atrial fibrillation, unspecified: Secondary | ICD-10-CM | POA: Diagnosis present

## 2014-07-15 DIAGNOSIS — Z79899 Other long term (current) drug therapy: Secondary | ICD-10-CM | POA: Diagnosis not present

## 2014-07-15 DIAGNOSIS — I4729 Other ventricular tachycardia: Secondary | ICD-10-CM

## 2014-07-15 DIAGNOSIS — M109 Gout, unspecified: Secondary | ICD-10-CM | POA: Diagnosis present

## 2014-07-15 DIAGNOSIS — Z7982 Long term (current) use of aspirin: Secondary | ICD-10-CM

## 2014-07-15 DIAGNOSIS — I429 Cardiomyopathy, unspecified: Secondary | ICD-10-CM | POA: Diagnosis present

## 2014-07-15 DIAGNOSIS — Z87891 Personal history of nicotine dependence: Secondary | ICD-10-CM

## 2014-07-15 DIAGNOSIS — Z9114 Patient's other noncompliance with medication regimen: Secondary | ICD-10-CM | POA: Diagnosis present

## 2014-07-15 DIAGNOSIS — I129 Hypertensive chronic kidney disease with stage 1 through stage 4 chronic kidney disease, or unspecified chronic kidney disease: Secondary | ICD-10-CM | POA: Diagnosis present

## 2014-07-15 DIAGNOSIS — Z950 Presence of cardiac pacemaker: Secondary | ICD-10-CM | POA: Diagnosis present

## 2014-07-15 DIAGNOSIS — I509 Heart failure, unspecified: Secondary | ICD-10-CM

## 2014-07-15 DIAGNOSIS — I5043 Acute on chronic combined systolic (congestive) and diastolic (congestive) heart failure: Secondary | ICD-10-CM | POA: Diagnosis present

## 2014-07-15 DIAGNOSIS — T82119A Breakdown (mechanical) of unspecified cardiac electronic device, initial encounter: Secondary | ICD-10-CM | POA: Diagnosis not present

## 2014-07-15 DIAGNOSIS — I10 Essential (primary) hypertension: Secondary | ICD-10-CM | POA: Diagnosis present

## 2014-07-15 DIAGNOSIS — E876 Hypokalemia: Secondary | ICD-10-CM | POA: Diagnosis present

## 2014-07-15 LAB — CBC WITH DIFFERENTIAL/PLATELET
BASOS ABS: 0 10*3/uL (ref 0.0–0.1)
BASOS PCT: 0 % (ref 0–1)
EOS PCT: 1 % (ref 0–5)
Eosinophils Absolute: 0 10*3/uL (ref 0.0–0.7)
HCT: 35.6 % — ABNORMAL LOW (ref 39.0–52.0)
HEMOGLOBIN: 11.6 g/dL — AB (ref 13.0–17.0)
LYMPHS PCT: 7 % — AB (ref 12–46)
Lymphs Abs: 0.3 10*3/uL — ABNORMAL LOW (ref 0.7–4.0)
MCH: 31 pg (ref 26.0–34.0)
MCHC: 32.6 g/dL (ref 30.0–36.0)
MCV: 95.2 fL (ref 78.0–100.0)
MONO ABS: 0.4 10*3/uL (ref 0.1–1.0)
Monocytes Relative: 9 % (ref 3–12)
Neutro Abs: 3.8 10*3/uL (ref 1.7–7.7)
Neutrophils Relative %: 83 % — ABNORMAL HIGH (ref 43–77)
Platelets: 197 10*3/uL (ref 150–400)
RBC: 3.74 MIL/uL — ABNORMAL LOW (ref 4.22–5.81)
RDW: 15.6 % — ABNORMAL HIGH (ref 11.5–15.5)
WBC: 4.6 10*3/uL (ref 4.0–10.5)

## 2014-07-15 LAB — COMPREHENSIVE METABOLIC PANEL
ALBUMIN: 3.5 g/dL (ref 3.5–5.2)
ALK PHOS: 90 U/L (ref 39–117)
ALT: 16 U/L (ref 0–53)
ANION GAP: 8 (ref 5–15)
AST: 30 U/L (ref 0–37)
BUN: 22 mg/dL (ref 6–23)
CALCIUM: 9 mg/dL (ref 8.4–10.5)
CO2: 30 mmol/L (ref 19–32)
Chloride: 104 mmol/L (ref 96–112)
Creatinine, Ser: 1.7 mg/dL — ABNORMAL HIGH (ref 0.50–1.35)
GFR calc Af Amer: 40 mL/min — ABNORMAL LOW (ref >90)
GFR calc non Af Amer: 34 mL/min — ABNORMAL LOW (ref >90)
Glucose, Bld: 123 mg/dL — ABNORMAL HIGH (ref 70–99)
POTASSIUM: 3.8 mmol/L (ref 3.5–5.1)
Sodium: 142 mmol/L (ref 135–145)
Total Bilirubin: 1.6 mg/dL — ABNORMAL HIGH (ref 0.3–1.2)
Total Protein: 7.1 g/dL (ref 6.0–8.3)

## 2014-07-15 LAB — PROTIME-INR
INR: 1.3 (ref 0.00–1.49)
Prothrombin Time: 16.3 seconds — ABNORMAL HIGH (ref 11.6–15.2)

## 2014-07-15 LAB — BRAIN NATRIURETIC PEPTIDE: B Natriuretic Peptide: 1049.7 pg/mL — ABNORMAL HIGH (ref 0.0–100.0)

## 2014-07-15 LAB — TROPONIN I: TROPONIN I: 0.05 ng/mL — AB (ref <0.031)

## 2014-07-15 LAB — DIGOXIN LEVEL: Digoxin Level: <0.2 ng/mL — ABNORMAL LOW (ref 0.8–2.0)

## 2014-07-15 MED ORDER — SODIUM CHLORIDE 0.9 % IV SOLN: 250.0000 mL | INTRAVENOUS | Status: DC | PRN

## 2014-07-15 MED ORDER — SODIUM CHLORIDE 0.9 % IJ SOLN
3.0000 mL | Freq: Two times a day (BID) | INTRAMUSCULAR | Status: DC
  Administered 2014-07-16 – 2014-07-19 (×8): 3 mL via INTRAVENOUS

## 2014-07-15 MED ORDER — HEPARIN SODIUM (PORCINE) 5000 UNIT/ML IJ SOLN: 5000.0000 [IU] | Freq: Three times a day (TID) | INTRAMUSCULAR | Status: DC

## 2014-07-15 MED ORDER — APIXABAN 2.5 MG PO TABS
2.5000 mg | ORAL_TABLET | Freq: Two times a day (BID) | ORAL | Status: DC
  Administered 2014-07-16 (×2): 2.5 mg via ORAL
  Filled 2014-07-15 (×3): qty 1

## 2014-07-15 MED ORDER — LISINOPRIL 5 MG PO TABS
5.0000 mg | ORAL_TABLET | Freq: Every day | ORAL | Status: DC
  Administered 2014-07-16 – 2014-07-19 (×4): 5 mg via ORAL
  Filled 2014-07-15 (×4): qty 1

## 2014-07-15 MED ORDER — ACETAMINOPHEN 325 MG PO TABS: 650.0000 mg | ORAL_TABLET | ORAL | Status: DC | PRN

## 2014-07-15 MED ORDER — SODIUM CHLORIDE 0.9 % IV SOLN
INTRAVENOUS | Status: DC
  Administered 2014-07-15: 15:00:00 via INTRAVENOUS

## 2014-07-15 MED ORDER — FUROSEMIDE 10 MG/ML IJ SOLN
80.0000 mg | Freq: Once | INTRAMUSCULAR | Status: AC
Start: 2014-07-15 — End: 2014-07-15
  Administered 2014-07-15: 80 mg via INTRAVENOUS
  Filled 2014-07-15: qty 8

## 2014-07-15 MED ORDER — ONDANSETRON HCL 4 MG/2ML IJ SOLN: 4.0000 mg | Freq: Four times a day (QID) | INTRAMUSCULAR | Status: DC | PRN

## 2014-07-15 MED ORDER — FUROSEMIDE 10 MG/ML IJ SOLN
80.0000 mg | Freq: Two times a day (BID) | INTRAMUSCULAR | Status: DC
Start: 2014-07-16 — End: 2014-07-16
  Administered 2014-07-16: 80 mg via INTRAVENOUS
  Filled 2014-07-15 (×2): qty 8

## 2014-07-15 MED ORDER — SODIUM CHLORIDE 0.9 % IJ SOLN: 3.0000 mL | INTRAMUSCULAR | Status: DC | PRN

## 2014-07-15 NOTE — ED Notes (Signed)
Per ems, pt coming in today from urgent care for SOB since this morning. Pt reports that he has been experiencing exertional dyspnea. Urgent care also reported that the pts pace maker was not working properly. Pt alert x4. NAD at this time.

## 2014-07-15 NOTE — ED Notes (Signed)
Ambulated pt pt was able to stand and walk for about 5 feet before WOB was too great to continue. Tachy in the 160s, O2 sats down to 80. Placed on oxygen and given lasix. EDP notified.

## 2014-07-15 NOTE — ED Provider Notes (Addendum)
CSN: [id number]     Arrival date & time 07/15/14  1516 History   First MD Initiated Contact with Patient 07/15/14 1518     Chief Complaint  Patient presents with  . Shortness of Breath     (Consider location/radiation/quality/duration/timing/severity/associated sxs/prior Treatment) HPI Comments: Patient presents to the ER for evaluation of shortness of breath. He is sent to the ER from urgent care. Patient reports that he woke up this morning feeling very short of breath. He noticed that he was having difficulty walking around his house because he became very short of breath when he walked. He did not get any chest pain. He has not had any fever or cough. Denies nausea, vomiting, diarrhea. He does have swelling of his legs but reports that it goes "up and down".  Patient is a 81 y.o. male presenting with shortness of breath.  Shortness of Breath Associated symptoms: no chest pain     Past Medical History  Diagnosis Date  . Bradycardia   . Chronic atrial fibrillation     Sick Sinus syndrome 1998  . Complete heart block     a. Initial placement 2005. s/p Medtronic pacemaker gen change 2013.  Marland Kitchen HTN (hypertension), benign   . Pacemaker-Medtronic   . Non-ischemic cardiomyopathy     last EF 35% by Echo at [facility] 2008  . Gout   . Elevated PSA     asymptomatic  . Corns and callosities   . Cognitive impairment     a. Taken off Coumadin due to this.  . Chronic combined systolic and diastolic CHF (congestive heart failure)   . CKD (chronic kidney disease), stage III    Past Surgical History  Procedure Laterality Date  . Pacemaker insertion  1998    power source change 2005  . Permanent pacemaker generator change N/A 03/05/2012    Procedure: PERMANENT PACEMAKER GENERATOR CHANGE;  Surgeon: Marinus Maw, MD;  Location: [facility] CATH [facility];  Service: Cardiovascular;  Laterality: N/A;   Family History  Problem Relation Age of Onset  . Heart failure Mother    History  Substance Use Topics  .  Smoking status: Former Games developer  . Smokeless tobacco: Not on file  . Alcohol Use: No    Review of Systems  Respiratory: Positive for shortness of breath.   Cardiovascular: Positive for leg swelling. Negative for chest pain.  All other systems reviewed and are negative.     Allergies  Asa  Home Medications   Prior to Admission medications   Medication Sig Start Date End Date Taking? Authorizing Provider  digoxin (LANOXIN) 0.125 MG tablet Take 0.5 tablets (0.0625 mg total) by mouth daily. 06/13/14  Yes Lyn Records III, MD  furosemide (LASIX) 40 MG tablet Take 2 tablets (80 mg total) by mouth daily. 06/13/14  Yes Lyn Records III, MD  aspirin EC 81 MG tablet Take 1 tablet (81 mg total) by mouth daily. Patient not taking: Reported on 06/12/2014 10/31/13   Lyn Records III, MD   BP 152/98 mmHg  Pulse 93  Temp(Src) 97.6 F (36.4 C) (Oral)  Resp 20  SpO2 97% Physical Exam  Constitutional: He is oriented to person, place, and time. He appears well-developed and well-nourished. No distress.  HENT:  Head: Normocephalic and atraumatic.  Right Ear: Hearing normal.  Left Ear: Hearing normal.  Nose: Nose normal.  Mouth/Throat: Oropharynx is clear and moist and mucous membranes are normal.  Eyes: Conjunctivae and EOM are normal. Pupils are equal, round,  and reactive to light.  Neck: Normal range of motion. Neck supple.  Cardiovascular: Regular rhythm, S1 normal and S2 normal.  Exam reveals no gallop and no friction rub.   No murmur heard. Pulmonary/Chest: Effort normal. No respiratory distress. He has decreased breath sounds in the right lower field. He exhibits no tenderness.  Abdominal: Soft. Normal appearance and bowel sounds are normal. There is no hepatosplenomegaly. There is no tenderness. There is no rebound, no guarding, no tenderness at McBurney's point and negative Murphy's sign. No hernia.  Musculoskeletal: Normal range of motion. He exhibits edema.  Neurological: He is alert and  oriented to person, place, and time. He has normal strength. No cranial nerve deficit or sensory deficit. Coordination normal. GCS eye subscore is 4. GCS verbal subscore is 5. GCS motor subscore is 6.  Skin: Skin is warm, dry and intact. No rash noted. No cyanosis.  Psychiatric: He has a normal mood and affect. His speech is normal and behavior is normal. Thought content normal.  Nursing note and vitals reviewed.   ED Course  Procedures (including critical care time) Labs Review Labs Reviewed  CBC WITH DIFFERENTIAL/PLATELET - Abnormal; Notable for the following:    RBC 3.74 (*)    Hemoglobin 11.6 (*)    HCT 35.6 (*)    RDW 15.6 (*)    Neutrophils Relative % 83 (*)    Lymphocytes Relative 7 (*)    Lymphs Abs 0.3 (*)    All other components within normal limits  COMPREHENSIVE METABOLIC PANEL - Abnormal; Notable for the following:    Glucose, Bld 123 (*)    Creatinine, Ser 1.70 (*)    Total Bilirubin 1.6 (*)    GFR calc non Af Amer 34 (*)    GFR calc Af Amer 40 (*)    All other components within normal limits  TROPONIN I - Abnormal; Notable for the following:    Troponin I 0.05 (*)    All other components within normal limits  BRAIN NATRIURETIC PEPTIDE - Abnormal; Notable for the following:    B Natriuretic Peptide 1049.7 (*)    All other components within normal limits  PROTIME-INR - Abnormal; Notable for the following:    Prothrombin Time 16.3 (*)    All other components within normal limits  DIGOXIN LEVEL - Abnormal; Notable for the following:    Digoxin Level <0.2 (*)    All other components within normal limits    Imaging Review Dg Chest 2 View  07/15/2014   CLINICAL DATA:  Weakness. Shortness of breath. Pacemaker 11 months ago.  EXAM: CHEST  2 VIEW  COMPARISON:  11/09/2013  FINDINGS: Single lead pacer with lead at right ventricle. Patient rotated right. Midline trachea. Moderate cardiomegaly with aortic atherosclerosis. A small right pleural effusion is either new or  increased. Suggestion of loculation laterally. Trace left pleural fluid or thickening, blunting the costophrenic angle. New. No pneumothorax. Suspect mild pulmonary venous congestion. No overt congestive failure. Right greater than left bibasilar airspace disease.  IMPRESSION: New or increased right pleural effusion, small. Suspect mild loculation laterally.  Cardiomegaly with mild pulmonary venous congestion. Possible trace left pleural fluid.  Bibasilar Airspace disease. Favor atelectasis on the left. On the right, this could represent atelectasis or infection.   Electronically Signed   By: Jeronimo Greaves M.D.   On: 07/15/2014 16:59     EKG Interpretation None      MDM   Final diagnoses:  Shortness of breath  Acute on chronic  combined systolic and diastolic congestive heart failure    Patient presents to the ER for evaluation of shortness of breath. Patient does have a history of congestive heart failure. He has been experiencing increased swelling of his legs. Lung examination revealed diminished breath sounds at the right base which correspond to pleural effusion on x-ray. Patient has an elevated BNP. Troponin slightly elevated at 0.05. No ischemia on EKG (from urgent care just prior to arrival in the ER). Workup consistent with acute decompensated congestive heart failure.  Pacemaker was interrogated. Patient has been experiencing episodes of tachycardia. Patient has had 24 episodes of high ventricular rate since October of last year, the most recent was 3 days ago. Patient's highest heart rate was 240 bpm. Episodes have lasted between 3 and 15 seconds.  Patient history Lasix 80 mg IV here in the ER. He is extremely short of breath with minimal exertion. Patient was only able to walk to the doorway in his room before he desated to 80% and became out of breath.  Will consult cardiology to further evaluate patient.    Gilda Crease, MD 07/15/14 9179  Gilda Crease,  MD 07/15/14 [id number]

## 2014-07-15 NOTE — ED Notes (Signed)
Cardiology at bedside.

## 2014-07-15 NOTE — Progress Notes (Signed)
ANTICOAGULATION CONSULT NOTE - Initial Consult  Pharmacy Consult for Eliquis Indication: atrial fibrillation  Allergies  Allergen Reactions  . Asa [Aspirin] Other (See Comments)    Told not to take med-per MD    Patient Measurements: Height: 5\' 11"  (180.3 cm) Weight: 126 lb 3.2 oz (57.244 kg) (scale c) IBW/kg (Calculated) : 75.3  Vital Signs: Temp: 97.2 F (36.2 C) (03/05 2338) Temp Source: Oral (03/05 2338) BP: 153/71 mmHg (03/05 2338) Pulse Rate: 93 (03/05 2338)  Labs:  Recent Labs  07/15/14 1531  HGB 11.6*  HCT 35.6*  PLT 197  LABPROT 16.3*  INR 1.30  CREATININE 1.70*  TROPONINI 0.05*    Estimated Creatinine Clearance: 24.8 mL/min (by C-G formula based on Cr of 1.7).   Medical History: Past Medical History  Diagnosis Date  . Bradycardia   . Chronic atrial fibrillation     Sick Sinus syndrome 1998  . Complete heart block     a. Initial placement 2005. s/p Medtronic pacemaker gen change 2013.  Marland Kitchen HTN (hypertension), benign   . Pacemaker-Medtronic   . Non-ischemic cardiomyopathy     last EF 35% by Echo at [facility] 2008  . Gout   . Elevated PSA     asymptomatic  . Corns and callosities   . Cognitive impairment     a. Taken off Coumadin due to this.  . Chronic combined systolic and diastolic CHF (congestive heart failure)   . CKD (chronic kidney disease), stage III     Medications:  Prescriptions prior to admission  Medication Sig Dispense Refill Last Dose  . digoxin (LANOXIN) 0.125 MG tablet Take 0.5 tablets (0.0625 mg total) by mouth daily. 30 tablet 0 Past Week at Unknown time  . furosemide (LASIX) 40 MG tablet Take 2 tablets (80 mg total) by mouth daily. 30 tablet 0 Past Week at Unknown time  . aspirin EC 81 MG tablet Take 1 tablet (81 mg total) by mouth daily. (Patient not taking: Reported on 06/12/2014)   Not Taking    Assessment: 81 yo male with Afib for Eliquis.  SCr elevated/Age > 80  Plan:  Eliquis 2.5 mg BID  Eddie Candle 07/15/2014,11:55 PM

## 2014-07-15 NOTE — ED Provider Notes (Signed)
CSN: [id number]     Arrival date & time 07/15/14  1357 History   First MD Initiated Contact with Patient 07/15/14 1419     Chief Complaint  Patient presents with  . Shortness of Breath   (Consider location/radiation/quality/duration/timing/severity/associated sxs/prior Treatment) HPI       81 year old male with history of chronic A. fib, sick sinus syndrome, complete heart block, combined systolic and diastolic CHF, stage III chronic kidney disease, with a pacemaker, presents complaining of shortness of breath. This started this morning. He is extremely short of breath with any activity. Also he has increased leg swelling. No chest pain or NVD.  Past Medical History  Diagnosis Date  . Bradycardia   . Chronic atrial fibrillation     Sick Sinus syndrome 1998  . Complete heart block     a. Initial placement 2005. s/p Medtronic pacemaker gen change 2013.  Marland Kitchen HTN (hypertension), benign   . Pacemaker-Medtronic   . Non-ischemic cardiomyopathy     last EF 35% by Echo at [facility] 2008  . Gout   . Elevated PSA     asymptomatic  . Corns and callosities   . Cognitive impairment     a. Taken off Coumadin due to this.  . Chronic combined systolic and diastolic CHF (congestive heart failure)   . CKD (chronic kidney disease), stage III    Past Surgical History  Procedure Laterality Date  . Pacemaker insertion  1998    power source change 2005  . Permanent pacemaker generator change N/A 03/05/2012    Procedure: PERMANENT PACEMAKER GENERATOR CHANGE;  Surgeon: Marinus Maw, MD;  Location: [facility] CATH LAB;  Service: Cardiovascular;  Laterality: N/A;   Family History  Problem Relation Age of Onset  . Heart failure Mother    History  Substance Use Topics  . Smoking status: Former Games developer  . Smokeless tobacco: Not on file  . Alcohol Use: No    Review of Systems  Respiratory: Positive for cough and shortness of breath.   All other systems reviewed and are negative.   Allergies  Review of  patient's allergies indicates no known allergies.  Home Medications   Prior to Admission medications   Medication Sig Start Date End Date Taking? Authorizing Provider  aspirin EC 81 MG tablet Take 1 tablet (81 mg total) by mouth daily. Patient not taking: Reported on 06/12/2014 10/31/13   Lyn Records III, MD  digoxin (LANOXIN) 0.125 MG tablet Take 0.5 tablets (0.0625 mg total) by mouth daily. 06/13/14   Lyn Records III, MD  furosemide (LASIX) 40 MG tablet Take 2 tablets (80 mg total) by mouth daily. 06/13/14   Lyn Records III, MD  potassium chloride SA (K-DUR,KLOR-CON) 20 MEQ tablet Take 20 mEq by mouth daily.    Historical Provider, MD  warfarin (COUMADIN) 4 MG tablet Take 4 mg by mouth daily.    Historical Provider, MD   BP 96/66 mmHg  Pulse 110  Temp(Src) 97.1 F (36.2 C) (Oral)  Resp 20  SpO2 93% Physical Exam  Constitutional: He is oriented to person, place, and time. He appears well-developed and well-nourished. No distress.  HENT:  Head: Normocephalic.  Cardiovascular: An irregularly irregular rhythm present.  Pulmonary/Chest: He is in respiratory distress.  Increased respiratory effort  Neurological: He is alert and oriented to person, place, and time. Coordination normal.  Skin: Skin is warm and dry. No rash noted. He is not diaphoretic.  Psychiatric: He has a normal mood and affect. Judgment  normal.  Nursing note and vitals reviewed.   ED Course  Procedures (including critical care time) Labs Review Labs Reviewed - No data to display  Imaging Review No results found.   Date: 07/15/2014  Rate: 97   Rhythm: atrial fibrillation  QRS Axis: left  Intervals: unable to determine  ST/T Wave abnormalities: nonspecific ST changes  Conduction Disutrbances:none  Narrative Interpretation:   Old EKG Reviewed: changes noted QRS complexes of at least 4 separate morphologies   MDM   1. SOB (shortness of breath)    Transferred to ED via EMS    Graylon Good,  PA-C 07/15/14 1448  Graylon Good, PA-C 07/15/14 1501

## 2014-07-15 NOTE — ED Notes (Signed)
Called lab concerning pts pending blood work. They said that the results should be up soon.

## 2014-07-15 NOTE — ED Notes (Signed)
Pt up to bathroom without oxygen, informed that he needs to stay in bed and use the urinal d/t increased work of breathing with ambulation.

## 2014-07-15 NOTE — ED Notes (Signed)
Patient c/o SOB onset this morning. Patient reports he had difficulty walking from his room to the kitchen. Reports he had a pacemaker placed about 11 months ago. Patient is sitting upright and speaking in complete sentences.

## 2014-07-15 NOTE — H&P (Signed)
Physician History and Physical    Charles Hendricks MRN: [id number] DOB/AGE: 79-Jul-1926 79 y.o. Admit date: 07/15/2014   Primary Cardiologist:  Dr. Katrinka Blazing  CC:  SOB  HPI:  81 yo M with h/o NICM EF 35% from Last echo 2008, , Afib, complete HB s/p PPM, HTN, and CKD who presented to ER from urgent care after symptoms of SOB this morning and over last several days. He also endorsed PND and orthopnea but overall a very poor historian and he admitted to not able to taking his medications on a regular basis. He denies any chest pain or syncope. PPM was interrogated and revealed 24 episodes of NSVT since 02/2014, most recently 3 days ago.   Review of systems: A review of 10 organ systems was done and is negative except as stated above in HPI  Past Medical History  Diagnosis Date  . Bradycardia   . Chronic atrial fibrillation     Sick Sinus syndrome 1998  . Complete heart block     a. Initial placement 2005. s/p Medtronic pacemaker gen change 2013.  Marland Kitchen HTN (hypertension), benign   . Pacemaker-Medtronic   . Non-ischemic cardiomyopathy     last EF 35% by Echo at [facility] 2008  . Gout   . Elevated PSA     asymptomatic  . Corns and callosities   . Cognitive impairment     a. Taken off Coumadin due to this.  . Chronic combined systolic and diastolic CHF (congestive heart failure)   . CKD (chronic kidney disease), stage III    Past Surgical History  Procedure Laterality Date  . Pacemaker insertion  1998    power source change 2005  . Permanent pacemaker generator change N/A 03/05/2012    Procedure: PERMANENT PACEMAKER GENERATOR CHANGE;  Surgeon: Marinus Maw, MD;  Location: [facility] CATH LAB;  Service: Cardiovascular;  Laterality: N/A;   History   Social History  . Marital Status: Married    Spouse Name: N/A  . Number of Children: N/A  . Years of Education: N/A   Occupational History  . Not on file.   Social History Main Topics  . Smoking status: Former Games developer  . Smokeless tobacco: Not  on file  . Alcohol Use: No  . Drug Use: Not on file  . Sexual Activity: Not on file   Other Topics Concern  . Not on file   Social History Narrative    Family History  Problem Relation Age of Onset  . Heart failure Mother      Allergies  Allergen Reactions  . Asa [Aspirin] Other (See Comments)    Told not to take med-per MD     (Not in a hospital admission)  No current facility-administered medications for this encounter.  Current outpatient prescriptions:  .  digoxin (LANOXIN) 0.125 MG tablet, Take 0.5 tablets (0.0625 mg total) by mouth daily., Disp: 30 tablet, Rfl: 0 .  furosemide (LASIX) 40 MG tablet, Take 2 tablets (80 mg total) by mouth daily., Disp: 30 tablet, Rfl: 0 .  aspirin EC 81 MG tablet, Take 1 tablet (81 mg total) by mouth daily. (Patient not taking: Reported on 06/12/2014), Disp: , Rfl:   Physical Exam: Blood pressure 160/92, pulse 105, temperature 97.6 F (36.4 C), temperature source Oral, resp. rate 25, SpO2 100 %.; There is no weight on file to calculate BMI. Temp:  [97.1 F (36.2 C)-97.6 F (36.4 C)] 97.6 F (36.4 C) (03/05 1526) Pulse Rate:  [58-110] 105 (03/05 2030) Resp:  [  13-25] 25 (03/05 2030) BP: (96-181)/(66-111) 160/92 mmHg (03/05 2030) SpO2:  [93 %-100 %] 100 % (03/05 2030)   Intake/Output Summary (Last 24 hours) at 07/15/14 2149 Last data filed at 07/15/14 2045  Gross per 24 hour  Intake      0 ml  Output     80 ml  Net    -80 ml   General: NAD Heent: MMM Neck: JVD up to mandible.  CV: Nondisplaced PMI.  RRR, nl S1/S2, no S3/S4, no murmur. No carotid bruit   Lungs: Bilateral basial crackles and decreased BS.  Abdomen: Soft, nontender, nondistended Extremities: No clubbing or cyanosis.  Normal pedal pulses. No pedal edema Skin: Intact without lesions or rashes  Neurologic: Alert and oriented x 3, grossly nonfocal  Psych: Normal mood and affect    Labs:  Recent Labs  07/15/14 1531  TROPONINI 0.05*   Lab Results  Component  Value Date   WBC 4.6 07/15/2014   HGB 11.6* 07/15/2014   HCT 35.6* 07/15/2014   MCV 95.2 07/15/2014   PLT 197 07/15/2014    Recent Labs Lab 07/15/14 1531  NA 142  K 3.8  CL 104  CO2 30  BUN 22  CREATININE 1.70*  CALCIUM 9.0  PROT 7.1  BILITOT 1.6*  ALKPHOS 90  ALT 16  AST 30  GLUCOSE 123*   No results found for: CHOL, HDL, LDLCALC, TRIG     EKG:  Afib with PVCs and LAFB Radiology:  Dg Chest 2 View  07/15/2014   CLINICAL DATA:  Weakness. Shortness of breath. Pacemaker 11 months ago.  EXAM: CHEST  2 VIEW  COMPARISON:  11/09/2013  FINDINGS: Single lead pacer with lead at right ventricle. Patient rotated right. Midline trachea. Moderate cardiomegaly with aortic atherosclerosis. A small right pleural effusion is either new or increased. Suggestion of loculation laterally. Trace left pleural fluid or thickening, blunting the costophrenic angle. New. No pneumothorax. Suspect mild pulmonary venous congestion. No overt congestive failure. Right greater than left bibasilar airspace disease.  IMPRESSION: New or increased right pleural effusion, small. Suspect mild loculation laterally.  Cardiomegaly with mild pulmonary venous congestion. Possible trace left pleural fluid.  Bibasilar Airspace disease. Favor atelectasis on the left. On the right, this could represent atelectasis or infection.   Electronically Signed   By: Jeronimo Greaves M.D.   On: 07/15/2014 16:59    ASSESSMENT:  81 yo M with h/o NICM EF 35% from Last echo 2008, , Afib, complete HB s/p PPM, HTN, and CKD presented with:  1. CHF exacerbation likely due to medical non-adherence 2. Multiple episodes of NSVTs likely 2/2 #1 3. Uncontrolled HTN 4. Pleural effusion likely 2/2 to #1 5. Chronic Afib with high risk of stroke.    PLAN:  1. IV diuresis with goal of negative 1.5 L a day, will schedule 80 mg IV BID with prn doses.  2. Echocardiogram to re-evaluate EF 3. For evidence based HF meds, his Creatinine is at baseline, will  start small dose Lisinopril now for both HF and HTN (this is much better for him as TID hydralazine would be impossible for him to take on a regular basis). When euvolumic, start beta blocker likely tomorrow. Aldactone can be considered before discharge. He has limited benefit from Statin.  4. If EF remains low, would be a candidate for ICD but he seems to be against invasive measures at this point.  5. Due to his high risk of CVA with Afib, renal adjusted Eliquis is probably  best option for him in term of CVA prevention. .  5. Discharge is pending on optimization of his fluid status.  6. Disposition may need to involve home health or NH, he does not seem to be able to manage his meds.   Signed: Haydee Salter, MD Cardiology Fellow 07/15/2014, 9:49 PM

## 2014-07-15 NOTE — ED Notes (Signed)
Attempted report 

## 2014-07-16 DIAGNOSIS — I4891 Unspecified atrial fibrillation: Secondary | ICD-10-CM

## 2014-07-16 DIAGNOSIS — I509 Heart failure, unspecified: Secondary | ICD-10-CM

## 2014-07-16 DIAGNOSIS — I10 Essential (primary) hypertension: Secondary | ICD-10-CM

## 2014-07-16 DIAGNOSIS — Z950 Presence of cardiac pacemaker: Secondary | ICD-10-CM

## 2014-07-16 DIAGNOSIS — I5041 Acute combined systolic (congestive) and diastolic (congestive) heart failure: Secondary | ICD-10-CM

## 2014-07-16 DIAGNOSIS — R4189 Other symptoms and signs involving cognitive functions and awareness: Secondary | ICD-10-CM

## 2014-07-16 DIAGNOSIS — I472 Ventricular tachycardia: Secondary | ICD-10-CM

## 2014-07-16 LAB — BASIC METABOLIC PANEL
ANION GAP: 6 (ref 5–15)
BUN: 24 mg/dL — ABNORMAL HIGH (ref 6–23)
CO2: 30 mmol/L (ref 19–32)
Calcium: 8.7 mg/dL (ref 8.4–10.5)
Chloride: 104 mmol/L (ref 96–112)
Creatinine, Ser: 1.83 mg/dL — ABNORMAL HIGH (ref 0.50–1.35)
GFR, EST AFRICAN AMERICAN: 37 mL/min — AB (ref >90)
GFR, EST NON AFRICAN AMERICAN: 32 mL/min — AB (ref >90)
GLUCOSE: 119 mg/dL — AB (ref 70–99)
Potassium: 3.7 mmol/L (ref 3.5–5.1)
Sodium: 140 mmol/L (ref 135–145)

## 2014-07-16 MED ORDER — HEPARIN SODIUM (PORCINE) 5000 UNIT/ML IJ SOLN
5000.0000 [IU] | Freq: Three times a day (TID) | INTRAMUSCULAR | Status: DC
  Administered 2014-07-16 – 2014-07-18 (×8): 5000 [IU] via SUBCUTANEOUS
  Filled 2014-07-16 (×11): qty 1

## 2014-07-16 MED ORDER — FUROSEMIDE 10 MG/ML IJ SOLN: 60.0000 mg | Freq: Three times a day (TID) | INTRAMUSCULAR | Status: DC

## 2014-07-16 MED ORDER — METOPROLOL SUCCINATE ER 25 MG PO TB24
25.0000 mg | ORAL_TABLET | Freq: Every day | ORAL | Status: DC
  Administered 2014-07-16 – 2014-07-17 (×2): 25 mg via ORAL
  Filled 2014-07-16 (×2): qty 1

## 2014-07-16 MED ORDER — FUROSEMIDE 10 MG/ML IJ SOLN
40.0000 mg | Freq: Two times a day (BID) | INTRAMUSCULAR | Status: DC
  Administered 2014-07-17 (×2): 40 mg via INTRAVENOUS
  Filled 2014-07-16 (×4): qty 4

## 2014-07-16 NOTE — Progress Notes (Signed)
Echocardiogram 2D Echocardiogram has been performed.  Charles Hendricks 07/16/2014, 12:05 PM

## 2014-07-16 NOTE — Progress Notes (Signed)
    Subjective:  Denies CP or dyspnea   Objective:  Filed Vitals:   07/15/14 2300 07/15/14 2338 07/16/14 0508 07/16/14 1036  BP: 135/76 153/71 126/72 149/79  Pulse:  93 86   Temp:  97.2 F (36.2 C) 97.6 F (36.4 C)   TempSrc:  Oral Oral   Resp: 17 18 18    Height:  5\' 11"  (1.803 m)    Weight:  126 lb 3.2 oz (57.244 kg) 126 lb 6.4 oz (57.335 kg)   SpO2:  100% 100%     Intake/Output from previous day:  Intake/Output Summary (Last 24 hours) at 07/16/14 1327 Last data filed at 07/16/14 1036  Gross per 24 hour  Intake    440 ml  Output     80 ml  Net    360 ml    Physical Exam: Physical exam: Well-developed frail in no acute distress.  Skin is warm and dry.  HEENT is normal.  Neck is supple. Chest diminished BS bases Cardiovascular exam is irregular, 2/6 systolic murmur LSB Abdominal exam nontender or distended. No masses palpated. Extremities show 1+ edema. neuro grossly intact    Lab Results: Basic Metabolic Panel:  Recent Labs  41/66/06 1531 07/16/14 0350  NA 142 140  K 3.8 3.7  CL 104 104  CO2 30 30  GLUCOSE 123* 119*  BUN 22 24*  CREATININE 1.70* 1.83*  CALCIUM 9.0 8.7   CBC:  Recent Labs  07/15/14 1531  WBC 4.6  NEUTROABS 3.8  HGB 11.6*  HCT 35.6*  MCV 95.2  PLT 197   Cardiac Enzymes:  Recent Labs  07/15/14 1531  TROPONINI 0.05*     Assessment/Plan:  1 acute on chronic systolic congestive heart failure-the patient remains volume overloaded. Change Lasix to 40 mg IV twice a day. Follow renal function. Await follow-up echocardiogram. 2 history of nonischemic cardiomyopathy-ACE inhibitor added. Add low-dose beta blocker. There is a history of noncompliance. 3 permanent atrial fibrillation-continue present medications for rate control. Patient felt not to be an anticoagulation candidate previously by Dr. Katrinka Blazing (cognitive impairment). DC apixaban. Continue aspirin. 4 nonsustained ventricular tachycardia-add toprol 5 hypertension-add  carvedilol and increase medications as needed. 6 status post previous pacemaker  7 chronic stage III renal insufficiency-follow renal function with diuresis. Patient can be discharged tomorrow if stable. He will follow up with Dr. Katrinka Blazing. Olga Millers 07/16/2014, 1:27 PM

## 2014-07-16 NOTE — Progress Notes (Signed)
SUBJECTIVE: Pt says he feels well and wants to go home. Says leg swelling has improved and denies chest pain, palpitations, and shortness of breath. He has cognitive impairment and several social issues with regards to care (please see Ronie Spies PA-C's note from 02/2014).     Intake/Output Summary (Last 24 hours) at 07/16/14 1318 Last data filed at 07/16/14 1036  Gross per 24 hour  Intake    440 ml  Output     80 ml  Net    360 ml    Current Facility-Administered Medications  Medication Dose Route Frequency Provider Last Rate Last Dose  . 0.9 %  sodium chloride infusion  250 mL Intravenous PRN Haydee Salter, MD      . acetaminophen (TYLENOL) tablet 650 mg  650 mg Oral Q4H PRN Haydee Salter, MD      . apixaban Everlene Balls) tablet 2.5 mg  2.5 mg Oral BID Lewayne Bunting, MD   2.5 mg at 07/16/14 1035  . furosemide (LASIX) injection 80 mg  80 mg Intravenous BID Haydee Salter, MD   80 mg at 07/16/14 1035  . lisinopril (PRINIVIL,ZESTRIL) tablet 5 mg  5 mg Oral Daily Haydee Salter, MD   5 mg at 07/16/14 1036  . ondansetron (ZOFRAN) injection 4 mg  4 mg Intravenous Q6H PRN Haydee Salter, MD      . sodium chloride 0.9 % injection 3 mL  3 mL Intravenous Q12H Haydee Salter, MD   3 mL at 07/16/14 1037  . sodium chloride 0.9 % injection 3 mL  3 mL Intravenous PRN Haydee Salter, MD        Filed Vitals:   07/15/14 2300 07/15/14 2338 07/16/14 0508 07/16/14 1036  BP: 135/76 153/71 126/72 149/79  Pulse:  93 86   Temp:  97.2 F (36.2 C) 97.6 F (36.4 C)   TempSrc:  Oral Oral   Resp: Height:   (1.803 m)    Weight:  126 lb 3.2 oz (57.244 kg) 126 lb 6.4 oz (57.335 kg)   SpO2:  100% 100%     PHYSICAL EXAM General: NAD, elderly, frail. HEENT: Normal. Neck: JVP 8-10 cm H2O  Lungs: Diminished sounds throughout, no wheezes. CV: Tachycardic, irregular rhythm, normal S1/S2, no S3, no murmur.  Trace pretibial edema.    Abdomen: Soft, nontender, no distention.  Neurologic: Alert and oriented.  Psych: Pleasant  affect. Musculoskeletal: No deformities. Extremities: No clubbing or cyanosis.   TELEMETRY: Reviewed telemetry pt in rapid atrial fibrillation with nonspecific IVCD and multiple PVC's.  LABS: Basic Metabolic Panel:  Recent Labs  40/98/11 1531 07/16/14 0350  NA 142 140  K 3.8 3.7  CL 104 104  CO2 30 30  GLUCOSE 123* 119*  BUN 22 24*  CREATININE 1.70* 1.83*  CALCIUM 9.0 8.7   Liver Function Tests:  Recent Labs  07/15/14 1531  AST 30  ALT 16  ALKPHOS 90  BILITOT 1.6*  PROT 7.1  ALBUMIN 3.5   No results for input(s): LIPASE, AMYLASE in the last 72 hours. CBC:  Recent Labs  07/15/14 1531  WBC 4.6  NEUTROABS 3.8  HGB 11.6*  HCT 35.6*  MCV 95.2  PLT 197   Cardiac Enzymes:  Recent Labs  07/15/14 1531  TROPONINI 0.05*   BNP: Invalid input(s): POCBNP D-Dimer: No results for input(s): DDIMER in the last 72 hours. Hemoglobin A1C: No results for input(s): HGBA1C in the last 72 hours. Fasting Lipid  Panel: No results for input(s): CHOL, HDL, LDLCALC, TRIG, CHOLHDL, LDLDIRECT in the last 72 hours. Thyroid Function Tests: No results for input(s): TSH, T4TOTAL, T3FREE, THYROIDAB in the last 72 hours.  Invalid input(s): FREET3 Anemia Panel: No results for input(s): VITAMINB12, FOLATE, FERRITIN, TIBC, IRON, RETICCTPCT in the last 72 hours.  RADIOLOGY: Dg Chest 2 View  07/15/2014   CLINICAL DATA:  Weakness. Shortness of breath. Pacemaker 11 months ago.  EXAM: CHEST  2 VIEW  COMPARISON:  11/09/2013  FINDINGS: Single lead pacer with lead at right ventricle. Patient rotated right. Midline trachea. Moderate cardiomegaly with aortic atherosclerosis. A small right pleural effusion is either new or increased. Suggestion of loculation laterally. Trace left pleural fluid or thickening, blunting the costophrenic angle. New. No pneumothorax. Suspect mild pulmonary venous congestion. No overt congestive failure. Right greater than left bibasilar airspace disease.  IMPRESSION: New  or increased right pleural effusion, small. Suspect mild loculation laterally.  Cardiomegaly with mild pulmonary venous congestion. Possible trace left pleural fluid.  Bibasilar Airspace disease. Favor atelectasis on the left. On the right, this could represent atelectasis or infection.   Electronically Signed   By: Jeronimo Greaves M.D.   On: 07/15/2014 16:59      ASSESSMENT AND PLAN: 1. Acute on chronic systolic heart failure: Started on Lasix IV 80 mg bid with no significant recorded response, but pt just went to toilet and this amount was not recorded (spoke with nurse). I will increase frequency to 60 mg IV tid. Await echocardiogram results. BP elevated. I will try Toprol-XL for rate control for rapid atrial fibrillation and given numerous episodes of NSVT. Digoxin level low confirming noncompliance. Restarted digoxin on 3/5.  2. Rapid atrial fibrillation/NSVT: Will try Toprol-XL for rate control. As per review of prior records, not a candidate for anticoagulation due to noncompliance and frailty. Was started on Eliquis by fellow on call last night. I will d/c.  3. Pacemaker: Reportedly interrogated on admission and revealed 24 episodes of NSVT since 02/2014, most recently four days ago. Will start Toprol-XL 25 mg.  4. Essential HTN: Lisinopril started 3/5. Will continue for now.  5. Social: Attempts in past to obtain protective custody as per record review. Case management already consulted.   Prentice Docker, M.D., F.A.C.C.

## 2014-07-16 NOTE — Progress Notes (Signed)
Patient arrived for ED for CHF exhaserbation. Afib/paced on telemetry. Alert and oriented X 4. No skin issues noted. Vital signs stable. No complaints of pain.

## 2014-07-17 DIAGNOSIS — I472 Ventricular tachycardia: Secondary | ICD-10-CM

## 2014-07-17 DIAGNOSIS — I509 Heart failure, unspecified: Secondary | ICD-10-CM

## 2014-07-17 DIAGNOSIS — I4729 Other ventricular tachycardia: Secondary | ICD-10-CM

## 2014-07-17 LAB — BASIC METABOLIC PANEL
Anion gap: 12 (ref 5–15)
BUN: 31 mg/dL — AB (ref 6–23)
CALCIUM: 8.6 mg/dL (ref 8.4–10.5)
CO2: 29 mmol/L (ref 19–32)
Chloride: 101 mmol/L (ref 96–112)
Creatinine, Ser: 2.06 mg/dL — ABNORMAL HIGH (ref 0.50–1.35)
GFR calc Af Amer: 32 mL/min — ABNORMAL LOW (ref >90)
GFR, EST NON AFRICAN AMERICAN: 27 mL/min — AB (ref >90)
GLUCOSE: 154 mg/dL — AB (ref 70–99)
Potassium: 3 mmol/L — ABNORMAL LOW (ref 3.5–5.1)
Sodium: 142 mmol/L (ref 135–145)

## 2014-07-17 MED ORDER — POTASSIUM CHLORIDE CRYS ER 20 MEQ PO TBCR
40.0000 meq | EXTENDED_RELEASE_TABLET | Freq: Once | ORAL | Status: AC
  Administered 2014-07-17: 40 meq via ORAL
  Filled 2014-07-17: qty 2

## 2014-07-17 MED ORDER — METOPROLOL SUCCINATE ER 25 MG PO TB24
25.0000 mg | ORAL_TABLET | Freq: Every day | ORAL | Status: AC
  Administered 2014-07-17: 25 mg via ORAL
  Filled 2014-07-17: qty 1

## 2014-07-17 MED ORDER — METOPROLOL SUCCINATE ER 50 MG PO TB24
50.0000 mg | ORAL_TABLET | Freq: Every day | ORAL | Status: DC
  Administered 2014-07-18 – 2014-07-19 (×2): 50 mg via ORAL
  Filled 2014-07-17 (×2): qty 1

## 2014-07-17 NOTE — Progress Notes (Signed)
Utilization review completed. Darsi Tien, RN, BSN. 

## 2014-07-17 NOTE — Progress Notes (Signed)
Patient Profile: 81 yo M with h/o NICM EF 35% from Last echo 2008, , Afib, complete HB s/p PPM, HTN, and CKD presented with:  1. CHF exacerbation likely due to medical non-adherence 2. Multiple episodes of NSVTs likely 2/2 #1 3. Uncontrolled HTN 4. Pleural effusion likely 2/2 to #1 5. Chronic Afib with high risk of stroke.   Subjective: Denies dyspnea and chest pain. No palpitations.   Objective: Vital signs in last 24 hours: Temp:  [97.5 F (36.4 C)-98.8 F (37.1 C)] 98 F (36.7 C) (03/07 0845) Pulse Rate:  [58-86] 77 (03/07 0845) Resp:  [18-20] 18 (03/07 0845) BP: (104-149)/(51-79) 135/64 mmHg (03/07 0845) SpO2:  [92 %-98 %] 98 % (03/07 0845) Weight:  [124 lb 6.4 oz (56.427 kg)] 124 lb 6.4 oz (56.427 kg) (03/07 0500) Last BM Date: 07/16/14  Intake/Output from previous day: 03/06 0701 - 03/07 0700 In: 1040 [P.O.:1040] Out: 300 [Urine:300] Intake/Output this shift: Total I/O In: 120 [P.O.:120] Out: -   Medications Current Facility-Administered Medications  Medication Dose Route Frequency Provider Last Rate Last Dose  . 0.9 %  sodium chloride infusion  250 mL Intravenous PRN Haydee Salter, MD      . acetaminophen (TYLENOL) tablet 650 mg  650 mg Oral Q4H PRN Haydee Salter, MD      . furosemide (LASIX) injection 40 mg  40 mg Intravenous BID Lewayne Bunting, MD      . heparin injection 5,000 Units  5,000 Units Subcutaneous 3 times per day Lewayne Bunting, MD   5,000 Units at 07/16/14 2155  . lisinopril (PRINIVIL,ZESTRIL) tablet 5 mg  5 mg Oral Daily Haydee Salter, MD   5 mg at 07/16/14 1036  . metoprolol succinate (TOPROL-XL) 24 hr tablet 25 mg  25 mg Oral Daily Laqueta Linden, MD   25 mg at 07/16/14 1514  . ondansetron (ZOFRAN) injection 4 mg  4 mg Intravenous Q6H PRN Haydee Salter, MD      . sodium chloride 0.9 % injection 3 mL  3 mL Intravenous Q12H Haydee Salter, MD   3 mL at 07/16/14 2155  . sodium chloride 0.9 % injection 3 mL  3 mL Intravenous PRN Haydee Salter, MD        PE: General  appearance: alert, cooperative and no distress Neck: no carotid bruit and no JVD Lungs: faint rales over LLL Heart: regular rate and rhythm and 2/6 SM at the LSB and apex Extremities: trace - 1+ bilateral LEE Pulses: 2+ and symmetric Skin: warm and dry Neurologic: Grossly normal  Lab Results:   Recent Labs  07/15/14 1531  WBC 4.6  HGB 11.6*  HCT 35.6*  PLT 197   BMET  Recent Labs  07/15/14 1531 07/16/14 0350 07/17/14 0557  NA 142 140 142  K 3.8 3.7 3.0*  CL 104 104 101  CO2 30 30 29   GLUCOSE 123* 119* 154*  BUN 22 24* 31*  CREATININE 1.70* 1.83* 2.06*  CALCIUM 9.0 8.7 8.6   PT/INR  Recent Labs  07/15/14 1531  LABPROT 16.3*  INR 1.30   Filed Weights   07/15/14 2338 07/16/14 0508 07/17/14 0500  Weight: 126 lb 3.2 oz (57.244 kg) 126 lb 6.4 oz (57.335 kg) 124 lb 6.4 oz (56.427 kg)    Assessment/Plan  Active Problems:   CHF exacerbation  1. Acute on chronic systolic heart failure: symptoms improved. I/Os not accurately recorded. Pt has been using toilet on several occasions. On IV Lasix BID. Still with trace edema. Plan  for conversion to PO in the next 24 hrs. 2D echo with EF 25-30%. On Toprol-XL for rate control for rapid atrial fibrillation and given numerous episodes of NSVT. Also on lisinopril and digoxin.   2. Rapid atrial fibrillation/NSVT:  Resting rate in the low 100s. He had another run on NSVT earlier this am. Asymptomatic. Continue Toprol-XL and digoxin for rate control. May need to further increase Toprol-XL to 50 mg. As per review of prior records, not a candidate for anticoagulation due to noncompliance and frailty.   3. Pacemaker: Reportedly interrogated on admission and revealed 24 episodes of NSVT since 02/2014, most recently four days ago. Now on Toprol-XL 25 mg.  4. Essential HTN: Lisinopril started 3/5. Also on metoprolol. BP controlled.   5. Hypokalemia: K is 3.0. Repleat with supplemental K-dur.    LOS: 2 days    Brittainy M.  Delmer Islam 07/17/2014 9:42 AM  Personally seen and examined. Agree with above. Agree with increasing metoprolol to . Has BP to tolerate and Pacer for backup. Ordered extra dose for today. This should also help with NSVT. Replete KCL.   Donato Schultz, MD

## 2014-07-18 DIAGNOSIS — I482 Chronic atrial fibrillation: Secondary | ICD-10-CM

## 2014-07-18 DIAGNOSIS — I5043 Acute on chronic combined systolic (congestive) and diastolic (congestive) heart failure: Principal | ICD-10-CM

## 2014-07-18 LAB — BASIC METABOLIC PANEL
ANION GAP: 15 (ref 5–15)
BUN: 34 mg/dL — ABNORMAL HIGH (ref 6–23)
CALCIUM: 8.6 mg/dL (ref 8.4–10.5)
CO2: 30 mmol/L (ref 19–32)
Chloride: 98 mmol/L (ref 96–112)
Creatinine, Ser: 2.06 mg/dL — ABNORMAL HIGH (ref 0.50–1.35)
GFR calc Af Amer: 32 mL/min — ABNORMAL LOW (ref >90)
GFR, EST NON AFRICAN AMERICAN: 27 mL/min — AB (ref >90)
Glucose, Bld: 91 mg/dL (ref 70–99)
Potassium: 3.3 mmol/L — ABNORMAL LOW (ref 3.5–5.1)
Sodium: 143 mmol/L (ref 135–145)

## 2014-07-18 LAB — MAGNESIUM: Magnesium: 1.2 mg/dL — ABNORMAL LOW (ref 1.5–2.5)

## 2014-07-18 MED ORDER — FUROSEMIDE 40 MG PO TABS
40.0000 mg | ORAL_TABLET | Freq: Two times a day (BID) | ORAL | Status: DC
  Administered 2014-07-18 – 2014-07-19 (×3): 40 mg via ORAL
  Filled 2014-07-18 (×5): qty 1

## 2014-07-18 MED ORDER — POTASSIUM CHLORIDE CRYS ER 20 MEQ PO TBCR
20.0000 meq | EXTENDED_RELEASE_TABLET | ORAL | Status: AC
  Administered 2014-07-18 (×3): 20 meq via ORAL
  Filled 2014-07-18 (×3): qty 1

## 2014-07-18 NOTE — Progress Notes (Signed)
Heart Failure Navigator Consult Note  Presentation: Darold Miley is a 81 yo M with h/o NICM EF 35% from Last echo 2008, , Afib, complete HB s/p PPM, HTN, and CKD who presented to ER from urgent care after symptoms of SOB this morning and over last several days. He also endorsed PND and orthopnea but overall a very poor historian and he admitted to not able to taking his medications on a regular basis. He denies any chest pain or syncope. PPM was interrogated and revealed 24 episodes of NSVT since 02/2014, most recently 3 days ago.   Past Medical History  Diagnosis Date  . Bradycardia   . Chronic atrial fibrillation     Sick Sinus syndrome 1998  . Complete heart block     a. Initial placement 2005. s/p Medtronic pacemaker gen change 2013.  Marland Kitchen HTN (hypertension), benign   . Pacemaker-Medtronic   . Non-ischemic cardiomyopathy     last EF 35% by Echo at [facility] 2008  . Gout   . Elevated PSA     asymptomatic  . Corns and callosities   . Cognitive impairment     a. Taken off Coumadin due to this.  . Chronic combined systolic and diastolic CHF (congestive heart failure)   . CKD (chronic kidney disease), stage III     History   Social History  . Marital Status: Married    Spouse Name: N/A  . Number of Children: N/A  . Years of Education: N/A   Social History Main Topics  . Smoking status: Former Games developer  . Smokeless tobacco: Not on file  . Alcohol Use: No  . Drug Use: Not on file  . Sexual Activity: Not on file   Other Topics Concern  . None   Social History Narrative    ECHO:Study Conclusions--07/16/14 - Left ventricle: The cavity size was normal. Wall thickness was normal. Systolic function was severely reduced. The estimated ejection fraction was in the range of 25% to 30%. Diffuse hypokinesis. - Ventricular septum: The contour showed diastolic flattening and systolic flattening. - Aortic valve: There was mild regurgitation. - Mitral valve: There was mild  regurgitation. - Left atrium: The atrium was severely dilated. - Right ventricle: Systolic function was mildly reduced. - Right atrium: The atrium was severely dilated. - Atrial septum: There was an atrial septal aneurysm. - Tricuspid valve: There was severe regurgitation. - Pulmonary arteries: Systolic pressure was severely increased. - Pericardium, extracardiac: A trivial pericardial effusion was identified.  Impressions:  - Severe global reduction in LV function; severe biatrial enlargement; mild MR and AI; severe TR; severely elevated pulmonary pressure.  Transthoracic echocardiography. M-mode, complete 2D, spectral Doppler, and color Doppler. Birthdate: Patient birthdate: 05/16/1924. Age: Patient is 81 yr old. Sex: Gender: male. BMI: 17.6 kg/m^2. Blood pressure:   126/72 Patient status: Inpatient. Study date: Study date: 07/16/2014. Study time: 11:22 AM.  BNP    Component Value Date/Time   BNP 1049.7* 07/15/2014 1531    ProBNP    Component Value Date/Time   PROBNP 3408.0* 10/22/2013 1600     Education Assessment and Provision:  Detailed education and instructions provided on heart failure disease management including the following:  Signs and symptoms of Heart Failure When to call the physician Importance of daily weights Low sodium diet Fluid restriction Medication management Anticipated future follow-up appointments  I stopped in to see Mr. Brabec.  He seemed very confused--speaking about being in the [employer].  He was unable to focus and have a meaningful conversation  regarding his HF.  I will collaborate with SW about his disposition as I am unsure if he is safe to return home alone.  (He was previously living alone with his sister nearby).  Education Materials:  "Living Better With Heart Failure" Booklet, Daily Weight Tracker Tool    High Risk Criteria for Readmission and/or Poor Patient Outcomes:   EF <30%- Yes 25-30%   2 or more  admissions in 6 months-No  Difficult social situation- Yes  Demonstrates medication noncompliance- ?    Barriers of Care:  Knowledge and compliance, ? Ability to care for self  Discharge Planning:  Collaborating with SW as he seems very confused and ? Able to care for himself.

## 2014-07-18 NOTE — Progress Notes (Signed)
Nutrition Education Note  RD consulted for nutrition education regarding suggestions for cheap prepared foods for low Na diet. Patient admits that he was eating a Hungry Man TV dinner most nights PTA and understands this likely contributed to his leg swelling.   RD provided "Heart Failure Nutrition Therapy for the Undernourished" handout from the Academy of Nutrition and Dietetics. Reviewed patient's dietary recall. Provided examples on ways to decrease sodium intake in diet. Discouraged intake of processed foods and use of salt shaker. Encouraged fresh fruits and vegetables as well as whole grain sources of carbohydrates to maximize fiber intake. Encouraged pt to choose TV dinners with less than 800 mg of sodium per meal. Provided list "12 Good Choices for Low Sodium TV dinners" from [employer]. Provided tips for eating out, sample menus, and low sodium grocery ideas. Encouraged adding 1-2 snacks daily to promote weight gain. Provided low sodium/high calorie snack ideas.   RD discussed why it is important for patient to adhere to diet recommendations, and emphasized the role of fluids, foods to avoid, and importance of weighing self daily. Teach back method used.  Expect good compliance. Patient reports that he plans to stop adding salt to his food and he plans on finding low sodium TV dinners.   Body mass index is 16.74 kg/(m^2). Pt meets criteria for Underweight based on current BMI.  Current diet order is 2 Grams Sodium , patient is consuming approximately 50-100% of meals at this time. Labs and medications reviewed. No further nutrition interventions warranted at this time. RD contact information provided. If additional nutrition issues arise, please re-consult RD.   Ian Malkin RD, LDN Inpatient Clinical Dietitian Pager: [phone] After Hours Pager: [phone]

## 2014-07-18 NOTE — Progress Notes (Signed)
Patient Name: Charles Hendricks Date of Encounter: 07/18/2014  Principal Problem:   Acute on chronic systolic and diastolic heart failure, NYHA class 4 Active Problems:   Pacemaker-Medtronic   HTN (hypertension), benign   Chronic atrial fibrillation   NSVT (nonsustained ventricular tachycardia)   Primary Cardiologist: Dr. Katrinka Blazing  Patient Profile: 81 yo M with h/o NICM EF 35% from Last echo 2008, , Afib, complete HB s/p PPM, HTN, and CKD, was admitted 03/05 w/ increased CHF, NSVT, HTN, pleural eff, med non-compliance.  SUBJECTIVE: Says is compliant with rx, but was on 2 pills a day. Eating "Hungry Man" dinners. Willing to have help come to the house for RN and aide (especially the aide).  OBJECTIVE Filed Vitals:   07/17/14 1711 07/17/14 1807 07/17/14 2115 07/18/14 0646  BP: 137/65 130/70 122/81 129/74  Pulse: 65 72 68 77  Temp:  98 F (36.7 C) 98.5 F (36.9 C) 97.8 F (36.6 C)  TempSrc:  Oral Oral Oral  Resp:  18 12 17   Height:      Weight:    120 lb (54.432 kg)  SpO2:  96% 97%     Intake/Output Summary (Last 24 hours) at 07/18/14 0802 Last data filed at 07/18/14 0158  Gross per 24 hour  Intake   1200 ml  Output    385 ml  Net    815 ml   Filed Weights   07/16/14 0508 07/17/14 0500 07/18/14 0646  Weight: 126 lb 6.4 oz (57.335 kg) 124 lb 6.4 oz (56.427 kg) 120 lb (54.432 kg)    PHYSICAL EXAM General: Well developed, well nourished, male in no acute distress. Head: Normocephalic, atraumatic.  Neck: Supple without bruits, JVD elevated. Lungs:  Resp regular and unlabored, CTA. Heart: RRR, S1, S2, no S3, S4, 2/6 murmur; no rub. Abdomen: Soft, non-tender, non-distended, BS + x 4.  Extremities: No clubbing, cyanosis, no edema.  Neuro: Alert and oriented X 3. Moves all extremities spontaneously. Psych: Normal affect.  LABS: CBC:  Recent Labs  07/15/14 1531  WBC 4.6  NEUTROABS 3.8  HGB 11.6*  HCT 35.6*  MCV 95.2  PLT 197   INR:  Recent Labs   07/15/14 1531  INR 1.30   Basic Metabolic Panel:  Recent Labs  16/10/96 0557 07/18/14 0532  NA 142 143  K 3.0* 3.3*  CL 101 98  CO2 29 30  GLUCOSE 154* 91  BUN 31* 34*  CREATININE 2.06* 2.06*  CALCIUM 8.6 8.6   Liver Function Tests:  Recent Labs  07/15/14 1531  AST 30  ALT 16  ALKPHOS 90  BILITOT 1.6*  PROT 7.1  ALBUMIN 3.5   Cardiac Enzymes:  Recent Labs  07/15/14 1531  TROPONINI 0.05*   BNP:  B NATRIURETIC PEPTIDE  Date/Time Value Ref Range Status  07/15/2014 03:31 PM 1049.7* 0.0 - 100.0 pg/mL Final   TELE:        Radiology/Studies: Dg Chest 2 View 07/15/2014   CLINICAL DATA:  Weakness. Shortness of breath. Pacemaker 11 months ago.  EXAM: CHEST  2 VIEW  COMPARISON:  11/09/2013  FINDINGS: Single lead pacer with lead at right ventricle. Patient rotated right. Midline trachea. Moderate cardiomegaly with aortic atherosclerosis. A small right pleural effusion is either new or increased. Suggestion of loculation laterally. Trace left pleural fluid or thickening, blunting the costophrenic angle. New. No pneumothorax. Suspect mild pulmonary venous congestion. No overt congestive failure. Right greater than left bibasilar airspace disease.  IMPRESSION: New or increased right pleural  effusion, small. Suspect mild loculation laterally.  Cardiomegaly with mild pulmonary venous congestion. Possible trace left pleural fluid.  Bibasilar Airspace disease. Favor atelectasis on the left. On the right, this could represent atelectasis or infection.   Electronically Signed   By: Jeronimo Greaves M.D.   On: 07/15/2014 16:59   Current Medications:  . furosemide  40 mg Oral BID  . heparin subcutaneous  5,000 Units Subcutaneous 3 times per day  . lisinopril  5 mg Oral Daily  . metoprolol succinate  50 mg Oral Daily  . sodium chloride  3 mL Intravenous Q12H      ASSESSMENT AND PLAN: 1. Acute on chronic systolic heart failure:  - symptoms improved.  - I/Os not accurately recorded. Pt  has been using toilet on several occasions.  - wt down 6 lbs since admission - On IV Lasix 40 mg BID. No more edema.  - Plan for conversion to PO now - 2D echo with EF 25-30%.  - On Toprol-XL for rate control for rapid atrial fibrillation and given numerous episodes of NSVT. -  Also on lisinopril and digoxin.   2. Rapid atrial fibrillation: - Resting rate in the low 100s on admission - He had another run on NSVT earlier this am. Asymptomatic.  - Continue Toprol-XL 50 mg and digoxin for rate control.  - As per review of prior records, not a candidate for anticoagulation due to noncompliance and frailty.   3. Pacemaker:  - Reportedly interrogated on admission and revealed 24 episodes of NSVT since 02/2014, most recently four days ago.  - Now on Toprol-XL 50 mg.  4. Essential HTN:  - Lisinopril started 3/5. Also on metoprolol.  - BP controlled.   5. Hypokalemia:  - K+ 3.0>>3.3 after 40 meq KDur on 03/07 - give 60 meq today - need to keep K+ closer to 4.0 with NSVT  6. NSVT: - ck magnesium level - asymptomatic - continue BB  Plan: Nutrition needs to see and give him a list of low Na prepared meals that are inexpensive. He rides with his sister to the store once every 2 weeks or so. See if can get [facility] and aide. Change to PO Lasix. Possible d/c later today if can get all arranged. TCM appt next week.  SignedTheodore Demark , PA-C 8:02 AM 07/18/2014  Personally seen and examined. Agree with above. OK for DC home. PO lasix 40 BID. Diet.  Worried about home situation.  Thought it was Thursday - possible mild memory impairment as well.   Donato Schultz, MD

## 2014-07-18 NOTE — Evaluation (Signed)
Physical Therapy Evaluation Patient Details Name: Charles Hendricks MRN: [id number] DOB: 1925-01-06 Today's Date: 07/18/2014   History of Present Illness  Pt is an 81 yo M with h/o NICM EF 35% from Last echo 2008, , Afib, complete HB s/p PPM, HTN, and CKD, was admitted 03/05 w/ increased CHF, NSVT, HTN, pleural eff, med non-compliance.  Clinical Impression  Pt admitted with above diagnosis. Pt currently with functional limitations due to the deficits listed below (see PT Problem List). At the time of PT eval pt was able to perform transfers and ambulation with min guard assist (with RW) or mod assist (without an AD). Pt will benefit from skilled PT to increase their independence and safety with mobility to allow discharge to the venue listed below. Feel pt would greatly benefit from STR prior to return home as pt lives by himself, however at this time pt is not agreeable to SNF.     Follow Up Recommendations SNF;Supervision/Assistance - 24 hour    Equipment Recommendations  Rolling walker with 5" wheels    Recommendations for Other Services       Precautions / Restrictions Precautions Precautions: Fall Restrictions Weight Bearing Restrictions: No      Mobility  Bed Mobility Overal bed mobility: Needs Assistance Bed Mobility: Supine to Sit     Supine to sit: Min guard     General bed mobility comments: Pt required close guard to transition to full sitting position at EOB. Pt with decreased trunk control during initial sitting.   Transfers Overall transfer level: Needs assistance Equipment used: None;Rolling walker (2 wheeled) Transfers: Sit to/from Stand Sit to Stand: Mod assist;Min guard         General transfer comment: Without UE support, pt required mod assist to achieve full standing and maintain balance. On second attempt, pt used RW and only required min guard assist for safety.   Ambulation/Gait Ambulation/Gait assistance: Min guard Ambulation Distance (Feet): 75  Feet Assistive device: Rolling walker (2 wheeled) Gait Pattern/deviations: Step-through pattern;Decreased stride length;Trunk flexed;Drifts right/left Gait velocity: Decreased Gait velocity interpretation: Below normal speed for age/gender General Gait Details: Pt required VC's for improved posture, walker positioning closer to pt's body, and self-monitoring for energy conservation.  Stairs            Wheelchair Mobility    Modified Rankin (Stroke Patients Only)       Balance Overall balance assessment: Needs assistance Sitting-balance support: Feet supported;No upper extremity supported Sitting balance-Leahy Scale: Fair     Standing balance support: During functional activity;No upper extremity supported Standing balance-Leahy Scale: Poor                               Pertinent Vitals/Pain Pain Assessment: No/denies pain    Home Living Family/patient expects to be discharged to:: Private residence Living Arrangements: Alone Available Help at Discharge: Family;Available PRN/intermittently Type of Home: House         Home Equipment: Gilmer Mor - single point      Prior Function Level of Independence: Independent with assistive device(s)         Comments: Pt states he was using the cane all the time. Of note, pt states he was using a "walker" all the time, and then eventually pointed to his cane that was sitting in the corner of the room.      Hand Dominance        Extremity/Trunk Assessment   Upper Extremity Assessment:  Defer to OT evaluation           Lower Extremity Assessment: Generalized weakness      Cervical / Trunk Assessment: Normal  Communication   Communication: HOH  Cognition Arousal/Alertness: Awake/alert Behavior During Therapy: WFL for tasks assessed/performed Overall Cognitive Status: Impaired/Different from baseline Area of Impairment: Orientation;Safety/judgement Orientation Level: Disoriented to;Situation;Time        Safety/Judgement: Decreased awareness of safety;Decreased awareness of deficits          General Comments      Exercises        Assessment/Plan    PT Assessment Patient needs continued PT services  PT Diagnosis Difficulty walking;Generalized weakness   PT Problem List Decreased strength;Decreased range of motion;Decreased activity tolerance;Decreased balance;Decreased mobility;Decreased knowledge of use of DME;Decreased safety awareness;Decreased knowledge of precautions  PT Treatment Interventions DME instruction;Gait training;Stair training;Functional mobility training;Therapeutic activities;Therapeutic exercise;Neuromuscular re-education;Patient/family education   PT Goals (Current goals can be found in the Care Plan section) Acute Rehab PT Goals Patient Stated Goal: Return home PT Goal Formulation: With patient Time For Goal Achievement: 08/01/14 Potential to Achieve Goals: Good    Frequency Min 3X/week   Barriers to discharge Decreased caregiver support Pt lives alone    Co-evaluation               End of Session Equipment Utilized During Treatment: Gait belt Activity Tolerance: Patient tolerated treatment well Patient left: in chair;with chair alarm set;with call bell/phone within reach Nurse Communication: Mobility status         Time: 0981-1914 PT Time Calculation (min) (ACUTE ONLY): 29 min   Charges:   PT Evaluation $Initial PT Evaluation Tier I: 1 Procedure PT Treatments $Gait Training: 8-22 mins   PT G Codes:        Conni Slipper 07/29/14, 1:54 PM  Conni Slipper, PT, DPT Acute Rehabilitation Services Pager: [phone]

## 2014-07-18 NOTE — Progress Notes (Signed)
Pt. With 5 beat run of VTach. Pt. Alert and stable. No c/o pain. VSS. No s/s of distress noted. On call for [facility]  notified. No new orders received. RN will continue to monitor pt. For changes in condition. Veronnica Hennings, Cheryll Dessert

## 2014-07-18 NOTE — Progress Notes (Signed)
Pt. With unsteady gait and lack of understanding of physical ability. Pt. Attempting to get OOB unassisted/high fall risk. On call page made to Shirlee Latch, MD of [facility] made aware. New orders for safety sitter obtained. Thank you.

## 2014-07-19 ENCOUNTER — Telehealth: Payer: Self-pay | Admitting: Interventional Cardiology

## 2014-07-19 ENCOUNTER — Other Ambulatory Visit: Payer: Self-pay | Admitting: Physician Assistant

## 2014-07-19 DIAGNOSIS — I5042 Chronic combined systolic (congestive) and diastolic (congestive) heart failure: Secondary | ICD-10-CM

## 2014-07-19 DIAGNOSIS — R0602 Shortness of breath: Secondary | ICD-10-CM

## 2014-07-19 LAB — BASIC METABOLIC PANEL
Anion gap: 13 (ref 5–15)
BUN: 42 mg/dL — ABNORMAL HIGH (ref 6–23)
CHLORIDE: 100 mmol/L (ref 96–112)
CO2: 30 mmol/L (ref 19–32)
CREATININE: 2.04 mg/dL — AB (ref 0.50–1.35)
Calcium: 8.6 mg/dL (ref 8.4–10.5)
GFR calc Af Amer: 32 mL/min — ABNORMAL LOW (ref >90)
GFR, EST NON AFRICAN AMERICAN: 28 mL/min — AB (ref >90)
Glucose, Bld: 128 mg/dL — ABNORMAL HIGH (ref 70–99)
Potassium: 4 mmol/L (ref 3.5–5.1)
SODIUM: 143 mmol/L (ref 135–145)

## 2014-07-19 MED ORDER — FUROSEMIDE 40 MG PO TABS: 40.0000 mg | ORAL_TABLET | Freq: Two times a day (BID) | ORAL | Status: DC

## 2014-07-19 MED ORDER — FUROSEMIDE 40 MG PO TABS: 80.0000 mg | ORAL_TABLET | Freq: Two times a day (BID) | ORAL | Status: DC

## 2014-07-19 MED ORDER — LISINOPRIL 5 MG PO TABS: 5.0000 mg | ORAL_TABLET | Freq: Every day | ORAL | Status: DC

## 2014-07-19 MED ORDER — METOPROLOL SUCCINATE ER 50 MG PO TB24: 50.0000 mg | ORAL_TABLET | Freq: Every day | ORAL | Status: AC

## 2014-07-19 MED ORDER — MAGNESIUM OXIDE 400 MG PO CAPS: 1.0000 | ORAL_CAPSULE | Freq: Every day | ORAL | Status: DC

## 2014-07-19 NOTE — Progress Notes (Addendum)
Patient Name: Charles Hendricks Date of Encounter: 07/19/2014  Principal Problem:   Acute on chronic systolic and diastolic heart failure, NYHA class 4 Active Problems:   Pacemaker-Medtronic   HTN (hypertension), benign   Chronic atrial fibrillation   NSVT (nonsustained ventricular tachycardia)   Primary Cardiologist: Dr. Katrinka Blazing  Patient Profile: 81 yo M with h/o NICM EF 35% from Last echo 2008, , Afib, complete HB s/p PPM, HTN, and CKD, was admitted 03/05 w/ increased CHF, NSVT, HTN, pleural eff, med non-compliance.  SUBJECTIVE: Says is compliant with rx, but was on 2 pills a day. Eating "Hungry Man" dinners. Willing to have help come to the house for RN and aide (especially the aide). Would like meds delivered.   OBJECTIVE Filed Vitals:   07/18/14 1802 07/18/14 2100 07/19/14 0500 07/19/14 0838  BP: 105/68 142/85 137/88 118/68  Pulse: 73 76 97 78  Temp:  97.9 F (36.6 C) 97.4 F (36.3 C)   TempSrc:  Oral Oral   Resp:  16 18   Height:      Weight:   119 lb 6.4 oz (54.159 kg)   SpO2:  97% 97%     Intake/Output Summary (Last 24 hours) at 07/19/14 1009 Last data filed at 07/19/14 0913  Gross per 24 hour  Intake    830 ml  Output    576 ml  Net    254 ml   Filed Weights   07/17/14 0500 07/18/14 0646 07/19/14 0500  Weight: 124 lb 6.4 oz (56.427 kg) 120 lb (54.432 kg) 119 lb 6.4 oz (54.159 kg)    PHYSICAL EXAM General: Thin, male in no acute distress. Head: Normocephalic, atraumatic.  Neck: Supple without bruits, JVD elevated. Lungs:  Resp regular and unlabored, CTA. Heart: RRR, S1, S2, no S3, S4, 2/6 murmur; no rub. Abdomen: Soft, non-tender, non-distended, BS + x 4.  Extremities: No clubbing, cyanosis, no edema.  Neuro: Alert and oriented X 3. Moves all extremities spontaneously. Psych: Normal affect.  LABS: CBC: No results for input(s): WBC, NEUTROABS, HGB, HCT, MCV, PLT in the last 72 hours. INR: No results for input(s): INR in the last 72 hours. Basic  Metabolic Panel:  Recent Labs  41/28/78 0532 07/19/14 0528  NA 143 143  K 3.3* 4.0  CL 98 100  CO2 30 30  GLUCOSE 91 128*  BUN 34* 42*  CREATININE 2.06* 2.04*  CALCIUM 8.6 8.6  MG 1.2*  --    Liver Function Tests: No results for input(s): AST, ALT, ALKPHOS, BILITOT, PROT, ALBUMIN in the last 72 hours. Cardiac Enzymes: No results for input(s): CKTOTAL, CKMB, CKMBINDEX, TROPONINI in the last 72 hours. BNP:  B NATRIURETIC PEPTIDE  Date/Time Value Ref Range Status  07/15/2014 03:31 PM 1049.7* 0.0 - 100.0 pg/mL Final   TELE:        Radiology/Studies: Dg Chest 2 View 07/15/2014   CLINICAL DATA:  Weakness. Shortness of breath. Pacemaker 11 months ago.  EXAM: CHEST  2 VIEW  COMPARISON:  11/09/2013  FINDINGS: Single lead pacer with lead at right ventricle. Patient rotated right. Midline trachea. Moderate cardiomegaly with aortic atherosclerosis. A small right pleural effusion is either new or increased. Suggestion of loculation laterally. Trace left pleural fluid or thickening, blunting the costophrenic angle. New. No pneumothorax. Suspect mild pulmonary venous congestion. No overt congestive failure. Right greater than left bibasilar airspace disease.  IMPRESSION: New or increased right pleural effusion, small. Suspect mild loculation laterally.  Cardiomegaly with mild pulmonary venous congestion.  Possible trace left pleural fluid.  Bibasilar Airspace disease. Favor atelectasis on the left. On the right, this could represent atelectasis or infection.   Electronically Signed   By: Jeronimo Greaves M.D.   On: 07/15/2014 16:59   Current Medications:  . furosemide  40 mg Oral BID  . heparin subcutaneous  5,000 Units Subcutaneous 3 times per day  . lisinopril  5 mg Oral Daily  . metoprolol succinate  50 mg Oral Daily  . sodium chloride  3 mL Intravenous Q12H      ASSESSMENT AND PLAN: 1. Acute on chronic systolic heart failure:  - symptoms improved.  - I/Os not accurately recorded. Pt has been  using toilet on several occasions.  - wt down 6 lbs since admission - On IV Lasix 40 mg BID. No more edema.  - Plan for conversion to PO now - 2D echo with EF 25-30%.  - On Toprol-XL for rate control for rapid atrial fibrillation and given numerous episodes of NSVT. -  Also on lisinopril and digoxin.   2. Rapid atrial fibrillation: - Resting rate in the low 100s on admission - He had another short run on NSVT earlier, Asymptomatic.  - Continue Toprol-XL 50 mg and digoxin for rate control.  - As per review of prior records, not a candidate for anticoagulation due to noncompliance and frailty.   3. Pacemaker:  - Reportedly interrogated on admission and revealed 24 episodes of NSVT since 02/2014, most recently four days ago.  - Now on Toprol-XL 50 mg.  4. Essential HTN:  - Lisinopril started 3/5. Also on metoprolol.  - BP controlled.   5. Hypokalemia:  - replete  6. NSVT: -  asymptomatic - continue BB  Plan: Nutrition needs to see and give him a list of low Na prepared meals that are inexpensive. He rides with his sister to the store once every 2 weeks or so. See if can get [facility] and aide. Change to PO Lasix. DC today. TCM appt next week. DC was delayed yesterday because of home needs.    Donato Schultz, MD

## 2014-07-19 NOTE — Progress Notes (Signed)
CSW was finally able to speak with patient's sister Sandi Mealy this afternoon. CSW explained concerns about patient's level of confusion and Physical Therapy's recommendation for short term SNF.  Mrs. Earlene Plater became extremely upset about this stating that if her brother were "put away" it would kill him- that he would not agree or stay anywhere. There is no health care power of attorney and she states that patient's 2 granddaughters check on him during the week and she also checks on him.  Sister remained emphatic that patient manages well at home and just needs to get back into his familiar surroundings. She wants to "try" things at home with family's increased support and help as well as to set up maximum home health services-  RN, PT/OT/Aide and SW.Patient will also need a rolling walker.  Above discussed with Theodore Demark, PA and she stated that the MD would plan d/c in the morning. Patient's sister will plan to come and pick patient up tomorrow morning.  Lorri Frederick. Jaci Lazier, [place] [phone]

## 2014-07-19 NOTE — Discharge Instructions (Signed)
Home health RN to do a BMET on Monday. Results to Tereso Newcomer, PA-C and Dr. Katrinka Blazing.

## 2014-07-19 NOTE — Progress Notes (Signed)
All d/c instructions explained and given to pt by Toma Copier, RN.  Pt d/c off floor at 1442 to awaiting transport.  Raef Sprigg, Charity fundraiser.

## 2014-07-19 NOTE — Progress Notes (Signed)
Ok per MD for d/c today to home.  Patient remains alert but quite confused; sister remains insistent that he return home and not be placed in a nursing center.  She states that she will provide increased support of patient at home and will ask his 2 granddaughters to visit more as well. Sister feels that he will improve mentally once he gets home.  Patient verbalized a desire to return home- he appears to be alert to person and some to place.  He stated that his sister is very helpful at home and that he wants to go home. Patient declined idea of even short term SNF.  CSW discussed patient with Theodore Demark, PA and RNCM- Sidney Ace and maximum home health services will be initiated. CSW wanted to contact Adult Protective Services for follow up - however- patient does not meet criteria for abuse or neglect at this time. He will be monitored by [facility] staff and should abuse or neglect be determined, then a report and can be filed with DSS.  Sister arranged to transport patient home this afternoon by car.  Nursing aware. CSW signing off.  Lorri Frederick. Jaci Lazier, [place] [phone]

## 2014-07-19 NOTE — Clinical Social Work Psychosocial (Addendum)
    Clinical Social Work Department BRIEF PSYCHOSOCIAL ASSESSMENT 07/18/2014  Patient:  LIBERTY, [place]     Account Number:  [id number]     Admit date:  07/15/2014  Clinical Social Worker:  Manya Silvas  Date/Time:  07/18/2014 05:52 PM  Referred by:  Physician  Date Referred:  07/17/2014 Referred for  SNF Placement   Other Referral:   Interview type:  Other - See comment Other interview type:   Patient (oriented to person and place only)- and telephonic contact with sister- Sandi Mealy    PSYCHOSOCIAL DATA Living Status:  ALONE Admitted from facility:   Level of care:   Primary support name:  Sandi Mealy  [phone] Primary support relationship to patient:  SIBLING Degree of support available:   Strong support  has 2 granddaughters who visit multiple times during the week  Garth Bigness- [phone]    CURRENT CONCERNS Current Concerns  Other - See comment  Knowledge/Cognitive Deficit   Other Concerns:    SOCIAL WORK ASSESSMENT / PLAN 81 year old male referred to CSW for SNF placement per PT recommendation.  Patient has had marked confusion during hospital stay and he lives alone. CSW had attempted yesterday to reach patient's sister Merdis Delay without sucess but was able to reach her today to discuss d/c disposition. CSW had attempted to speak with patient about short term rehab but he was unable to focus and respond to the subject with any clarity. His speech was noted to be tansgenital and he spoke a great deal about his past military history and family members. He is able to state that he has a sister Merdis Delay but does not know phone numbers to any family members.  He states that he normally lives alone and that he does not want to seek short term rehab. Due to his level of confusion- CSW spoke with his sister Merdis Delay about recommendation for SNF. She became very upset and tearful about this- begging CSW not to "put him away" in a nursing home. She stated that he is  quite functional at home- still pays his bills, cooks and even still drives. CSW verbalized multiple concerns about patient's level of confusion but sister insists that this is based on hospitalization. She states that she will check on patient more and will stay with him if needed. She will also ask patient's granddaughters to check on patient more. RNCM will arrange home health services at d/c and sister agrees with this plan.   Assessment/plan status:  Psychosocial Support/Ongoing Assessment of Needs Other assessment/ plan:   Information/referral to community resources:   Home Health discussed and will be ordered    PATIENT'S/FAMILY'S RESPONSE TO PLAN OF CARE: Patient is alert and oriented to person and mostly to place during visit. He had noted confusion otherwise and extremely tansgenital thougths.  Patient also noted to be somewhat unsteady on his feet and PT recommended SNF which he refuses. He wants to go home and refuses SNF. His sister also refuses SNF

## 2014-07-19 NOTE — Discharge Summary (Signed)
CARDIOLOGY DISCHARGE SUMMARY   Patient ID: Charles Hendricks MRN: [id number] DOB/AGE: 1925/02/19 81 y.o.  Admit date: 07/15/2014 Discharge date: 07/19/2014  PCP: Pearla Dubonnet, MD Primary Cardiologist: Dr. Katrinka Blazing  Primary Discharge Diagnosis:  Acute on chronic systolic and diastolic CHF, class IV, weight at discharge 119 pounds  Secondary Discharge Diagnosis:    Pacemaker-Medtronic   HTN (hypertension), benign   Chronic atrial fibrillation   NSVT (nonsustained ventricular tachycardia)   CKD stage III   Hypomagnesemia  Procedures: Chest x-ray, 2-D echocardiogram  Hospital Course: Charles Hendricks is a 81 y.o. male with a history of NICM EF 35% from Last echo 2008, Afib, complete HB s/p PPM, HTN, and CKD. He went to urgent care with shortness of breath that had gotten worse over several days. He was in heart failure and transferred to the emergency room where he was admitted for further evaluation and treatment.  He was in heart failure and was immediately started on IV Lasix for diuresis. He diuresed well and his renal function plus his electrolytes were followed closely during his hospital stay. He lost 7 pounds during his hospital stay, and his discharge weight is 119 pounds. As his weight decreased, his respiratory status improved. By discharge, he did not require home oxygen. His potassium required supplementation, this was done.  He has a history of chronic kidney disease and his renal function was followed closely during his hospital stay. He did have an increase in his BUN and creatinine with diuresis, and this will be followed closely as an outpatient. Discharge labs are below.  He has a history of complete heart block and a Medtronic pacemaker. His device was interrogated on admission and showed multiple episodes of nonsustained VT. He was asymptomatic with these. He is in atrial fibrillation and generally paces from the ventricle.   A magnesium level was checked and was  low and therefore he will be on magnesium supplementation at discharge. He was started on a beta blocker and it is hoped he will continue to take this at discharge. His blood pressure tolerated the addition of the beta blocker.  There was concern for his functional status at home, as he admitted that he was having trouble at times doing ADLs and also displayed some confusion. He was seen by physical therapy, and skilled nursing facility placement was recommended.  The situation was discussed with his sister who is 2 years older than he and his primary caregiver. She was adamant that he would not do well if he were put in a facility. She stated she would get family members to help in his care. The patient also did not wish to go to a facility. Case management was involved and orders were placed for home health RN, PT, an aide, and a Child psychotherapist. A rolling walker was also ordered. Hopefully, his functional status will improve at home with some help.  On 07/19/2014, Charles Hendricks was seen by Dr. Anne Fu and all data were reviewed. His volume status was felt to be at baseline. No further inpatient workup was indicated and he is considered stable for discharge, to follow up as an outpatient. The importance of compliance with daily weights, dietary restrictions and with discharge medications was emphasized.  Of note, Charles Hendricks is not on chronic oral anticoagulation due to frailty and problems with compliance. He had aspirin 81 mg listed on his medication regimen but had stopped that prior to admission. Consideration can be given to restarting aspirin as an outpatient  if he does well. This patients CHA2DS2-VASc Score and unadjusted Ischemic Stroke Rate (% per year) is equal to 4.8 % stroke rate/year from a score of 4  Above score calculated as 1 point each if present [CHF, HTN, DM, Vascular=MI/PAD/Aortic Plaque, Age if 65-74, or Male] Above score calculated as 2 points each if present [Age > 75, or  Stroke/TIA/TE]  Labs:   Lab Results  Component Value Date   WBC 4.6 07/15/2014   HGB 11.6* 07/15/2014   HCT 35.6* 07/15/2014   MCV 95.2 07/15/2014   PLT 197 07/15/2014    Recent Labs Lab 07/15/14 1531  07/19/14 0528  NA 142  < > 143  K 3.8  < > 4.0  CL 104  < > 100  CO2 30  < > 30  BUN 22  < > 42*  CREATININE 1.70*  < > 2.04*  CALCIUM 9.0  < > 8.6  PROT 7.1  --   --   BILITOT 1.6*  --   --   ALKPHOS 90  --   --   ALT 16  --   --   AST 30  --   --   GLUCOSE 123*  < > 128*  < > = values in this interval not displayed.  B NATRIURETIC PEPTIDE  Date/Time Value Ref Range Status  07/15/2014 03:31 PM 1049.7* 0.0 - 100.0 pg/mL Final   Lab Results  Component Value Date   TROPONINI 0.05* 07/15/2014   MAGNESIUM  Date Value Ref Range Status  07/18/2014 1.2* 1.5 - 2.5 mg/dL Final      Radiology: Dg Chest 2 View 07/15/2014   CLINICAL DATA:  Weakness. Shortness of breath. Pacemaker 11 months ago.  EXAM: CHEST  2 VIEW  COMPARISON:  11/09/2013  FINDINGS: Single lead pacer with lead at right ventricle. Patient rotated right. Midline trachea. Moderate cardiomegaly with aortic atherosclerosis. A small right pleural effusion is either new or increased. Suggestion of loculation laterally. Trace left pleural fluid or thickening, blunting the costophrenic angle. New. No pneumothorax. Suspect mild pulmonary venous congestion. No overt congestive failure. Right greater than left bibasilar airspace disease.  IMPRESSION: New or increased right pleural effusion, small. Suspect mild loculation laterally.  Cardiomegaly with mild pulmonary venous congestion. Possible trace left pleural fluid.  Bibasilar Airspace disease. Favor atelectasis on the left. On the right, this could represent atelectasis or infection.   Electronically Signed   By: Jeronimo Greaves M.D.   On: 07/15/2014 16:59    EKG: Atrial fibrillation, generally with ventricular pacing, occasional PVCs  Echo: 07/16/2014 Conclusions - Left  ventricle: The cavity size was normal. Wall thickness was normal. Systolic function was severely reduced. The estimated ejection fraction was in the range of 25% to 30%. Diffuse hypokinesis. - Ventricular septum: The contour showed diastolic flattening and systolic flattening. - Aortic valve: There was mild regurgitation. - Mitral valve: There was mild regurgitation. - Left atrium: The atrium was severely dilated. - Right ventricle: Systolic function was mildly reduced. - Right atrium: The atrium was severely dilated. - Atrial septum: There was an atrial septal aneurysm. - Tricuspid valve: There was severe regurgitation. - Pulmonary arteries: Systolic pressure was severely increased. - Pericardium, extracardiac: A trivial pericardial effusion was identified. Impressions: - Severe global reduction in LV function; severe biatrial enlargement; mild MR and AI; severe TR; severely elevated pulmonary pressure.  FOLLOW UP PLANS AND APPOINTMENTS Allergies  Allergen Reactions  . Asa [Aspirin] Other (See Comments)    Told not to  take med-per MD     Medication List    STOP taking these medications        aspirin EC 81 MG tablet     digoxin 0.125 MG tablet  Commonly known as:  LANOXIN      TAKE these medications        furosemide 40 MG tablet  Commonly known as:  LASIX  Take 1 tablet (40 mg total) by mouth 2 (two) times daily. 1 tablet early a.m. and one tablet midafternoon.     lisinopril 5 MG tablet  Commonly known as:  PRINIVIL,ZESTRIL  Take 1 tablet (5 mg total) by mouth daily.     Magnesium Oxide 400 MG Caps  Take 1 capsule (400 mg total) by mouth daily.     metoprolol succinate 50 MG 24 hr tablet  Commonly known as:  TOPROL-XL  Take 1 tablet (50 mg total) by mouth daily. Take with or immediately following a meal.        Discharge Instructions    (HEART FAILURE PATIENTS) Call MD:  Anytime you have any of the following symptoms: 1) 3 pound weight gain  in 24 hours or 5 pounds in 1 week 2) shortness of breath, with or without a dry hacking cough 3) swelling in the hands, feet or stomach 4) if you have to sleep on extra pillows at night in order to breathe.    Complete by:  As directed      Diet - low sodium heart healthy    Complete by:  As directed      Increase activity slowly    Complete by:  As directed           Follow-up Information    Follow up with Tereso Newcomer, PA-C On 07/26/2014.   Specialty:  Physician Assistant   Why:  See for Dr. Katrinka Blazing at 12:10 pm. Please arrive about 15 minutes early for paperwork.   Contact information:   [address] [place] [address] [phone]       BRING ALL MEDICATIONS WITH YOU TO FOLLOW UP APPOINTMENTS  Time spent with patient to include physician time: 48 min Signed: Theodore Demark, PA-C 07/19/2014, 12:21 PM Co-Sign MD

## 2014-07-19 NOTE — Telephone Encounter (Signed)
New message    tcm appt on  3.16.2016 with Tereso Newcomer    Per Bjorn Loser b pa

## 2014-07-19 NOTE — Progress Notes (Signed)
Pt alert to self only overnight. Pt disoriented to time, place, situation. Pt continues to try to get out of bed. Pt placed on video monitor and bed alarm. BSC used for safety with assist x1. Pt with no complaints of pain or discomfort overnight. Will continue to monitor. Huel Coventry, RN

## 2014-07-19 NOTE — Care Management Note (Signed)
    Page 1 of 2   07/19/2014     5:41:13 PM CARE MANAGEMENT NOTE 07/19/2014  Patient:  Charles Hendricks, Charles Hendricks   Account Number:  [id number]  Date Initiated:  07/17/2014  Documentation initiated by:  Avie Arenas  Subjective/Objective Assessment:   Pt adm with CHF, weakness, confusion.   PTA, pt lives alone, has supportive sister.     Action/Plan:   PT/OT recommending SNF at dc, but pt and sister refusing. Will arrange max HH care, and DME as needed.   Anticipated DC Date:  07/19/2014   Anticipated DC Plan:  HOME W HOME HEALTH SERVICES      DC Planning Services  CM consult      [facility] Choice  HOME HEALTH   Choice offered to / List presented to:  C-5 Sibling   DME arranged  WALKER - ROLLING  BEDSIDE COMMODE      DME agency  [facility]     HH arranged  HH-1 RN  HH-2 PT  HH-3 OT  HH-4 NURSE'S AIDE  HH-6 SOCIAL WORKER      HH agency  [facility]   Status of service:  Completed, signed off Medicare Important Message given?  YES (If response is "NO", the following Medicare IM given date fields will be blank) Date Medicare IM given:  07/17/2014 Medicare IM given by:  [facility] Date Additional Medicare IM given:   Additional Medicare IM given by:    Discharge Disposition:  HOME W HOME HEALTH SERVICES  Per UR Regulation:  Reviewed for med. necessity/level of care/duration of stay  If discussed at Long Length of Stay Meetings, dates discussed:    Comments:  07/19/14 Sidney Ace, RN, BSN [phone] Pt for dc home today.  Will need HH follow up; referral to [facility], per pt/sister choice.  Start of care 24-48h post dc date.  Requests RW and 3 in 1 for home use-[facility] to provide.  07-17-14 1:15pm Avie Arenas, RNBSN [phone] Increased IV lasix for continued fluid overload.  Stopped IV antibiotics - converted to po.  Lives with SO.  PT to see - would like to have [facility] PT and will need RN at home on discharge.   [place] - [facility] options given. Referral  made to [facility].

## 2014-07-20 NOTE — Telephone Encounter (Signed)
Patient contacted regarding discharge from [facility] on 3/9  Patient understands to follow up with provider ? Charles Hendricks, [place] on 3/16 at 12:10pm  Patient understands discharge instructions? NO - pt is confused.  Spoke with pt sister she stated they setup all of his medications this morning for him to take based off of what was on the d/c list and what the Pharmacy gave them. Made sure Digoxin was not given and she was going to check tomorrow when she when and make sure but she didn't think that he was taking it anymore.  Patient understands medications and regiment? No Patient understands to bring all medications to this visit? Yes: Stated that he will bring all his medications  Talked with Sister, Charles Hendricks, as she has been visiting brother each day to give him his medications and helping him around the house.  She stated that she is awaiting to hear from Home Health setup before. Reviewed with her the date of next appointments.

## 2014-07-26 ENCOUNTER — Other Ambulatory Visit: Payer: Medicare Other | Admitting: *Deleted

## 2014-07-26 ENCOUNTER — Encounter: Payer: Self-pay | Admitting: Physician Assistant

## 2014-07-26 ENCOUNTER — Ambulatory Visit (INDEPENDENT_AMBULATORY_CARE_PROVIDER_SITE_OTHER): Payer: Medicare Other | Admitting: Physician Assistant

## 2014-07-26 ENCOUNTER — Other Ambulatory Visit: Payer: Self-pay | Admitting: *Deleted

## 2014-07-26 VITALS — BP 130/80 | HR 80 | Ht 71.0 in | Wt 121.0 lb

## 2014-07-26 DIAGNOSIS — I482 Chronic atrial fibrillation, unspecified: Secondary | ICD-10-CM

## 2014-07-26 DIAGNOSIS — I428 Other cardiomyopathies: Secondary | ICD-10-CM

## 2014-07-26 DIAGNOSIS — N189 Chronic kidney disease, unspecified: Secondary | ICD-10-CM | POA: Diagnosis not present

## 2014-07-26 DIAGNOSIS — I10 Essential (primary) hypertension: Secondary | ICD-10-CM

## 2014-07-26 DIAGNOSIS — I429 Cardiomyopathy, unspecified: Secondary | ICD-10-CM

## 2014-07-26 DIAGNOSIS — I5022 Chronic systolic (congestive) heart failure: Secondary | ICD-10-CM | POA: Diagnosis not present

## 2014-07-26 DIAGNOSIS — Z950 Presence of cardiac pacemaker: Secondary | ICD-10-CM

## 2014-07-26 DIAGNOSIS — I5042 Chronic combined systolic (congestive) and diastolic (congestive) heart failure: Secondary | ICD-10-CM

## 2014-07-26 LAB — BASIC METABOLIC PANEL
BUN: 44 mg/dL — ABNORMAL HIGH (ref 6–23)
CO2: 37 mEq/L — ABNORMAL HIGH (ref 19–32)
Calcium: 8.9 mg/dL (ref 8.4–10.5)
Chloride: 99 mEq/L (ref 96–112)
Creatinine, Ser: 1.59 mg/dL — ABNORMAL HIGH (ref 0.40–1.50)
GFR: 53.11 mL/min — ABNORMAL LOW (ref >60.00)
GLUCOSE: 99 mg/dL (ref 70–99)
POTASSIUM: 3.3 meq/L — AB (ref 3.5–5.1)
SODIUM: 144 meq/L (ref 135–145)

## 2014-07-26 LAB — MAGNESIUM: MAGNESIUM: 1.3 mg/dL — AB (ref 1.5–2.5)

## 2014-07-26 MED ORDER — POTASSIUM CHLORIDE CRYS ER 20 MEQ PO TBCR: 20.0000 meq | EXTENDED_RELEASE_TABLET | Freq: Every day | ORAL | Status: DC

## 2014-07-26 NOTE — Progress Notes (Signed)
Cardiology Office Note   Date:  07/26/2014   ID:  Charles Hendricks, DOB 17-Jun-1924, MRN [id number]  PCP:  Pearla Dubonnet, MD  Cardiologist:  Dr. Verdis Prime   Electrophysiologist:  Dr. Lewayne Bunting   Chief Complaint  Patient presents with  . Congestive Heart Failure  . Hospitalization Follow-up     History of Present Illness: Charles Hendricks is a 81 y.o. male with a hx of chronic atrial fibrillation, NICM with EF 35%, chronic combined CHF, cognitive impairment, HTN, CHB s/p PPM, CKD, non-compliance, frailty.  He is not a candidate for anticoagulation.    Admitted 3/5-3/9 with a/c systolic CHF.  CXR demonstrated R effusion.  He was diuresed.  He was noted to have AF with RVR and NSVT at times.  NSVT was also noted on PPM interrogation.  Digoxin was resumed.  Toprol-XL was added.  Echo demonstrated EF 25-30%.  Patient has a poor social situation. He was seen by case management and social work.  SNF recommended by case management but patient and sister refused.  Home health arranged.    Patient is here with his sister. He has a bag of medications he does not take. He has a bag of medications that he says he is taking. These are consistent with the medicines he was discharged on. He and his sister both seemed very confused about his medications. He was apparently missing Toprol for a while. Later in the interview he says he was not taking magnesium oxide. He has not yet been visited by home health. He tells me that he feels better since discharge. His breathing is improved. He is NYHA 2-2b. He denies chest pain. He denies significant cough or wheezing. He denies orthopnea, PND. LE edema is improved. He denies syncope. He does admit to eating frozen dinners every day.  Studies/Reports Reviewed Today:  Echo 07/16/14 - EF 25% to 30%. Diffuse hypokinesis. - Ventricular septum: The contour showed diastolic flattening and systolic flattening. - Aortic valve: There was mild regurgitation. -  Mitral valve: There was mild regurgitation. - Left atrium: The atrium was severely dilated. - Right ventricle: Systolic function was mildly reduced. - Right atrium: The atrium was severely dilated. - Atrial septum: There was an atrial septal aneurysm. - Tricuspid valve: There was severe regurgitation. - Pulmonary arteries: Systolic pressure was severely increased. - Pericardium, extracardiac: A trivial pericardial effusion was identified.  Impressions:  Severe global reduction in LV function; severe biatrial enlargement; mild MR and AI; severe TR; severely elevated pulmonary pressure.    Past Medical History  Diagnosis Date  . Bradycardia   . Chronic atrial fibrillation     Sick Sinus syndrome 1998  . Complete heart block     a. Initial placement 2005. s/p Medtronic pacemaker gen change 2013.  Marland Kitchen HTN (hypertension), benign   . Pacemaker-Medtronic   . Non-ischemic cardiomyopathy     last EF 35% by Echo at [facility] 2008  . Gout   . Elevated PSA     asymptomatic  . Corns and callosities   . Cognitive impairment     a. Taken off Coumadin due to this.  . Chronic combined systolic and diastolic CHF (congestive heart failure)   . CKD (chronic kidney disease), stage III     Past Surgical History  Procedure Laterality Date  . Pacemaker insertion  1998    power source change 2005  . Permanent pacemaker generator change N/A 03/05/2012    Procedure: PERMANENT PACEMAKER GENERATOR CHANGE;  Surgeon: Doylene Canning  Ladona Ridgel, MD;  Location: [facility] CATH LAB;  Service: Cardiovascular;  Laterality: N/A;     Current Outpatient Prescriptions  Medication Sig Dispense Refill  . furosemide (LASIX) 40 MG tablet Take 1 tablet (40 mg total) by mouth 2 (two) times daily. 1 tablet early a.m. and one tablet midafternoon. 60 tablet 3  . lisinopril (PRINIVIL,ZESTRIL) 5 MG tablet Take 1 tablet (5 mg total) by mouth daily. 30 tablet 3  . Magnesium Oxide 400 MG CAPS Take 1 capsule (400 mg total) by mouth daily. 30 capsule  3  . metoprolol succinate (TOPROL-XL) 50 MG 24 hr tablet Take 1 tablet (50 mg total) by mouth daily. Take with or immediately following a meal. 30 tablet 3   No current facility-administered medications for this visit.    Allergies:   Asa    Social History:  The patient  reports that he has quit smoking. He does not have any smokeless tobacco history on file. He reports that he does not drink alcohol.   Family History:  The patient's family history includes Heart failure in his mother.    ROS:   Please see the history of present illness.   Review of Systems  All other systems reviewed and are negative.    PHYSICAL EXAM: VS:  BP 130/80 mmHg  Pulse 80  Ht  (1.803 m)  Wt 121 lb (54.885 kg)  BMI 16.88 kg/m2    Wt Readings from Last 3 Encounters:  07/26/14 121 lb (54.885 kg)  07/19/14 119 lb 6.4 oz (54.159 kg)  06/12/14 120 lb (54.432 kg)     GEN: Well nourished, well developed, in no acute distress HEENT: normal Neck: + JVD, no masses Cardiac:  Normal S1/S2, RRR; 2/6 systolic murmur LSB,  no rubs or gallops, 2+ edema  Respiratory:  Decreased breath sounds bilateral bases, no wheezing, rhonchi or rales. GI: soft, nontender, nondistended, + BS MS: no deformity or atrophy Skin: warm and dry  Neuro:  CNs II-XII intact, Strength and sensation are intact Psych: Normal affect   EKG:  EKG is ordered today.  It demonstrates:   V paced, HR 80   Recent Labs: 10/22/2013: Pro B Natriuretic peptide (BNP) 3408.0* 02/14/2014: TSH 2.95 07/15/2014: ALT 16; B Natriuretic Peptide 1049.7*; Hemoglobin 11.6*; Platelets 197 07/18/2014: Magnesium 1.2* 07/19/2014: BUN 42*; Creatinine 2.04*; Potassium 4.0; Sodium 143    Lipid Panel No results found for: CHOL, TRIG, HDL, CHOLHDL, VLDL, LDLCALC, LDLDIRECT    ASSESSMENT AND PLAN:  Chronic systolic congestive heart failure Volume overall stable. He remains somewhat volume overloaded. However, given his current situation I recommend keeping  him on his current dose of Lasix. Check follow-up basic metabolic panel today and a Mg2+.  I had a long discussion with him and his sister regarding the importance of taking all his medications. I gave him different strategies for remembering to take his medications daily. We also discussed the importance of low-salt diet. I recommended that he stop eating frozen dinners on a daily basis.  NICM (nonischemic cardiomyopathy) Continue beta blocker and ACE inhibitor. He is not a candidate for AICD.  Chronic atrial fibrillation Rate controlled. He is not a candidate for anticoagulation.  CKD (chronic kidney disease), unspecified stage Check basic metabolic panel today.  Pacemaker Follow up with EP as planned.  Essential hypertension  Controlled.   Current medicines are reviewed at length with the patient today.  The patient does not have concerns regarding medicines.  The following changes have been made:  As  above.    Labs/ tests ordered today include:   Orders Placed This Encounter  Procedures  . Basic Metabolic Panel (BMET)  . Magnesium    Disposition:   FU with Dr. Verdis Prime or me 3 weeks.    Signed, Brynda Rim, MHS 07/26/2014 12:38 PM    [facility] [address] [place], [place], [place]  [address] Phone: [phone]; Fax: [phone]

## 2014-07-26 NOTE — Patient Instructions (Signed)
Your physician recommends that you continue on your current medications as directed. Please refer to the Current Medication list given to you today.    LABS TODAY BMET AND MAG   FOLLOW  UP WITH WE[facility]ER OR [facility] IN 3 WEEKS   GET A PILL BOX TO HELP WITH TAKING MEDICINES

## 2014-07-28 ENCOUNTER — Telehealth: Payer: Self-pay | Admitting: *Deleted

## 2014-07-28 ENCOUNTER — Encounter: Payer: Self-pay | Admitting: *Deleted

## 2014-07-28 DIAGNOSIS — I5042 Chronic combined systolic (congestive) and diastolic (congestive) heart failure: Secondary | ICD-10-CM

## 2014-07-28 DIAGNOSIS — N183 Chronic kidney disease, stage 3 unspecified: Secondary | ICD-10-CM

## 2014-07-28 NOTE — Telephone Encounter (Signed)
I called pt to verify if he is taking the magnesium. Pt seems very confused and keeps confusing the K+ w/the magnesium. Pt also seemed confused about his upcoming appts. Pt asked if I will make a note and send to him w/appts and magnesium. I said ok

## 2014-08-02 ENCOUNTER — Other Ambulatory Visit: Payer: Medicare Other

## 2014-08-09 ENCOUNTER — Telehealth: Payer: Self-pay | Admitting: Interventional Cardiology

## 2014-08-09 NOTE — Telephone Encounter (Signed)
New Message  Beth from [facility] wanted to confirm the pt's diagnosis as CHF. Please call back and discuss.

## 2014-08-16 ENCOUNTER — Encounter: Payer: Medicare Other | Admitting: Internal Medicine

## 2014-08-20 ENCOUNTER — Emergency Department (HOSPITAL_COMMUNITY): Payer: Medicare Other

## 2014-08-20 ENCOUNTER — Encounter (HOSPITAL_COMMUNITY): Payer: Self-pay | Admitting: Emergency Medicine

## 2014-08-20 ENCOUNTER — Inpatient Hospital Stay (HOSPITAL_COMMUNITY)
Admission: EM | Admit: 2014-08-20 | Discharge: 2014-08-30 | DRG: 871 | Disposition: A | Discharge status: Skilled Nursing Facility | Payer: Medicare Other | Attending: Internal Medicine | Admitting: Internal Medicine

## 2014-08-20 DIAGNOSIS — I444 Left anterior fascicular block: Secondary | ICD-10-CM | POA: Diagnosis present

## 2014-08-20 DIAGNOSIS — E870 Hyperosmolality and hypernatremia: Secondary | ICD-10-CM | POA: Diagnosis not present

## 2014-08-20 DIAGNOSIS — G9340 Encephalopathy, unspecified: Secondary | ICD-10-CM | POA: Diagnosis present

## 2014-08-20 DIAGNOSIS — Z886 Allergy status to analgesic agent status: Secondary | ICD-10-CM

## 2014-08-20 DIAGNOSIS — Z515 Encounter for palliative care: Secondary | ICD-10-CM

## 2014-08-20 DIAGNOSIS — I5022 Chronic systolic (congestive) heart failure: Secondary | ICD-10-CM | POA: Diagnosis not present

## 2014-08-20 DIAGNOSIS — N179 Acute kidney failure, unspecified: Secondary | ICD-10-CM | POA: Diagnosis present

## 2014-08-20 DIAGNOSIS — Z87891 Personal history of nicotine dependence: Secondary | ICD-10-CM

## 2014-08-20 DIAGNOSIS — I428 Other cardiomyopathies: Secondary | ICD-10-CM | POA: Diagnosis present

## 2014-08-20 DIAGNOSIS — R1310 Dysphagia, unspecified: Secondary | ICD-10-CM | POA: Diagnosis present

## 2014-08-20 DIAGNOSIS — I442 Atrioventricular block, complete: Secondary | ICD-10-CM | POA: Diagnosis present

## 2014-08-20 DIAGNOSIS — M109 Gout, unspecified: Secondary | ICD-10-CM | POA: Diagnosis present

## 2014-08-20 DIAGNOSIS — E875 Hyperkalemia: Secondary | ICD-10-CM | POA: Diagnosis present

## 2014-08-20 DIAGNOSIS — W19XXXA Unspecified fall, initial encounter: Secondary | ICD-10-CM

## 2014-08-20 DIAGNOSIS — I959 Hypotension, unspecified: Secondary | ICD-10-CM | POA: Diagnosis present

## 2014-08-20 DIAGNOSIS — E872 Acidosis, unspecified: Secondary | ICD-10-CM | POA: Diagnosis present

## 2014-08-20 DIAGNOSIS — T502X5A Adverse effect of carbonic-anhydrase inhibitors, benzothiadiazides and other diuretics, initial encounter: Secondary | ICD-10-CM | POA: Diagnosis not present

## 2014-08-20 DIAGNOSIS — E162 Hypoglycemia, unspecified: Secondary | ICD-10-CM | POA: Diagnosis present

## 2014-08-20 DIAGNOSIS — I482 Chronic atrial fibrillation: Secondary | ICD-10-CM | POA: Diagnosis present

## 2014-08-20 DIAGNOSIS — R4182 Altered mental status, unspecified: Secondary | ICD-10-CM | POA: Diagnosis present

## 2014-08-20 DIAGNOSIS — E860 Dehydration: Secondary | ICD-10-CM

## 2014-08-20 DIAGNOSIS — Z8249 Family history of ischemic heart disease and other diseases of the circulatory system: Secondary | ICD-10-CM

## 2014-08-20 DIAGNOSIS — D696 Thrombocytopenia, unspecified: Secondary | ICD-10-CM | POA: Diagnosis present

## 2014-08-20 DIAGNOSIS — N183 Chronic kidney disease, stage 3 (moderate): Secondary | ICD-10-CM | POA: Diagnosis not present

## 2014-08-20 DIAGNOSIS — Z950 Presence of cardiac pacemaker: Secondary | ICD-10-CM | POA: Diagnosis not present

## 2014-08-20 DIAGNOSIS — Y92009 Unspecified place in unspecified non-institutional (private) residence as the place of occurrence of the external cause: Secondary | ICD-10-CM

## 2014-08-20 DIAGNOSIS — N189 Chronic kidney disease, unspecified: Secondary | ICD-10-CM

## 2014-08-20 DIAGNOSIS — E876 Hypokalemia: Secondary | ICD-10-CM | POA: Diagnosis not present

## 2014-08-20 DIAGNOSIS — I5023 Acute on chronic systolic (congestive) heart failure: Secondary | ICD-10-CM | POA: Diagnosis not present

## 2014-08-20 DIAGNOSIS — R680 Hypothermia, not associated with low environmental temperature: Secondary | ICD-10-CM | POA: Diagnosis present

## 2014-08-20 DIAGNOSIS — Z66 Do not resuscitate: Secondary | ICD-10-CM | POA: Diagnosis not present

## 2014-08-20 DIAGNOSIS — R7989 Other specified abnormal findings of blood chemistry: Secondary | ICD-10-CM

## 2014-08-20 DIAGNOSIS — I129 Hypertensive chronic kidney disease with stage 1 through stage 4 chronic kidney disease, or unspecified chronic kidney disease: Secondary | ICD-10-CM | POA: Diagnosis present

## 2014-08-20 DIAGNOSIS — R748 Abnormal levels of other serum enzymes: Secondary | ICD-10-CM

## 2014-08-20 DIAGNOSIS — T68XXXD Hypothermia, subsequent encounter: Secondary | ICD-10-CM | POA: Diagnosis not present

## 2014-08-20 DIAGNOSIS — I495 Sick sinus syndrome: Secondary | ICD-10-CM | POA: Diagnosis present

## 2014-08-20 DIAGNOSIS — T68XXXA Hypothermia, initial encounter: Secondary | ICD-10-CM | POA: Diagnosis not present

## 2014-08-20 DIAGNOSIS — K7200 Acute and subacute hepatic failure without coma: Secondary | ICD-10-CM | POA: Diagnosis present

## 2014-08-20 DIAGNOSIS — A419 Sepsis, unspecified organism: Secondary | ICD-10-CM | POA: Diagnosis present

## 2014-08-20 DIAGNOSIS — F0390 Unspecified dementia without behavioral disturbance: Secondary | ICD-10-CM | POA: Diagnosis present

## 2014-08-20 DIAGNOSIS — N1832 Chronic kidney disease, stage 3b: Secondary | ICD-10-CM | POA: Diagnosis present

## 2014-08-20 DIAGNOSIS — I739 Peripheral vascular disease, unspecified: Secondary | ICD-10-CM | POA: Diagnosis present

## 2014-08-20 DIAGNOSIS — J189 Pneumonia, unspecified organism: Secondary | ICD-10-CM

## 2014-08-20 DIAGNOSIS — R945 Abnormal results of liver function studies: Secondary | ICD-10-CM

## 2014-08-20 DIAGNOSIS — R062 Wheezing: Secondary | ICD-10-CM

## 2014-08-20 LAB — COMPREHENSIVE METABOLIC PANEL
ALT: 74 U/L — ABNORMAL HIGH (ref 0–53)
ANION GAP: 21 — AB (ref 5–15)
AST: 135 U/L — AB (ref 0–37)
Albumin: 3 g/dL — ABNORMAL LOW (ref 3.5–5.2)
Alkaline Phosphatase: 89 U/L (ref 39–117)
BILIRUBIN TOTAL: 2.3 mg/dL — AB (ref 0.3–1.2)
BUN: 71 mg/dL — ABNORMAL HIGH (ref 6–23)
CALCIUM: 8.9 mg/dL (ref 8.4–10.5)
CHLORIDE: 109 mmol/L (ref 96–112)
CO2: 13 mmol/L — AB (ref 19–32)
Creatinine, Ser: 2.39 mg/dL — ABNORMAL HIGH (ref 0.50–1.35)
GFR, EST AFRICAN AMERICAN: 26 mL/min — AB (ref >90)
GFR, EST NON AFRICAN AMERICAN: 23 mL/min — AB (ref >90)
GLUCOSE: 84 mg/dL (ref 70–99)
POTASSIUM: 5.3 mmol/L — AB (ref 3.5–5.1)
Sodium: 143 mmol/L (ref 135–145)
TOTAL PROTEIN: 5.6 g/dL — AB (ref 6.0–8.3)

## 2014-08-20 LAB — I-STAT ARTERIAL BLOOD GAS, ED
ACID-BASE DEFICIT: 12 mmol/L — AB (ref 0.0–2.0)
Bicarbonate: 12.3 mEq/L — ABNORMAL LOW (ref 20.0–24.0)
O2 Saturation: 94 %
PO2 ART: 67 mmHg — AB (ref 80.0–100.0)
Patient temperature: 95.9
TCO2: 13 mmol/L (ref 0–100)
pCO2 arterial: 22.7 mmHg — ABNORMAL LOW (ref 35.0–45.0)
pH, Arterial: 7.334 — ABNORMAL LOW (ref 7.350–7.450)

## 2014-08-20 LAB — CBC
HCT: 41.6 % (ref 39.0–52.0)
Hemoglobin: 13.3 g/dL (ref 13.0–17.0)
MCH: 30.9 pg (ref 26.0–34.0)
MCHC: 32 g/dL (ref 30.0–36.0)
MCV: 96.5 fL (ref 78.0–100.0)
Platelets: 107 10*3/uL — ABNORMAL LOW (ref 150–400)
RBC: 4.31 MIL/uL (ref 4.22–5.81)
RDW: 16.3 % — ABNORMAL HIGH (ref 11.5–15.5)
WBC: 9.7 10*3/uL (ref 4.0–10.5)

## 2014-08-20 LAB — CK: Total CK: 614 U/L — ABNORMAL HIGH (ref 7–232)

## 2014-08-20 LAB — CBG MONITORING, ED
GLUCOSE-CAPILLARY: 89 mg/dL (ref 70–99)
Glucose-Capillary: 59 mg/dL — ABNORMAL LOW (ref 70–99)
Glucose-Capillary: 121 mg/dL — ABNORMAL HIGH (ref 70–99)

## 2014-08-20 LAB — TROPONIN I: Troponin I: 0.06 ng/mL — ABNORMAL HIGH (ref <0.031)

## 2014-08-20 LAB — PROTIME-INR
INR: 2.13 — ABNORMAL HIGH (ref 0.00–1.49)
Prothrombin Time: 24 seconds — ABNORMAL HIGH (ref 11.6–15.2)

## 2014-08-20 LAB — I-STAT CG4 LACTIC ACID, ED: Lactic Acid, Venous: 8.74 mmol/L (ref 0.5–2.0)

## 2014-08-20 MED ORDER — SODIUM CHLORIDE 0.9 % IV BOLUS (SEPSIS)
500.0000 mL | Freq: Once | INTRAVENOUS | Status: AC
  Administered 2014-08-20: 500 mL via INTRAVENOUS

## 2014-08-20 MED ORDER — DEXTROSE 50 % IV SOLN
25.0000 g | Freq: Once | INTRAVENOUS | Status: AC
  Administered 2014-08-21: 25 g via INTRAVENOUS

## 2014-08-20 MED ORDER — SODIUM CHLORIDE 0.9 % IV SOLN: INTRAVENOUS | Status: DC

## 2014-08-20 MED ORDER — SODIUM CHLORIDE 0.9 % IV BOLUS (SEPSIS): 1000.0000 mL | Freq: Once | INTRAVENOUS | Status: DC

## 2014-08-20 MED ORDER — DEXTROSE 50 % IV SOLN
1.0000 | Freq: Once | INTRAVENOUS | Status: AC
  Administered 2014-08-20: 50 mL via INTRAVENOUS
  Filled 2014-08-20: qty 50

## 2014-08-20 NOTE — ED Notes (Signed)
Bear hugger placed on pt.  Pt's chaplin at bedside.  Pt able to call him by name.

## 2014-08-20 NOTE — ED Notes (Signed)
Pt found on floor in bedroom today.  PD and EMS was called to resident when family could not get in touch with pt.  Pt's sister last spoke to pt yesterday.  On arrival to ED pt has edema in all extremities and face.  Will not answer any questions.  Pt mumbling incomprehensive words.

## 2014-08-20 NOTE — ED Notes (Signed)
Dr Preston Fleeting given a copy of lactic acid results 8.74

## 2014-08-20 NOTE — ED Notes (Signed)
CBG 59   Family at bedside

## 2014-08-20 NOTE — ED Notes (Signed)
Admitting MD at bedside.

## 2014-08-20 NOTE — ED Notes (Signed)
Patient transported to X-ray 

## 2014-08-20 NOTE — ED Notes (Signed)
Bear hugger placed back on pt. 

## 2014-08-20 NOTE — ED Provider Notes (Addendum)
CSN: [id number]     Arrival date & time 08/20/14  1736 History   First MD Initiated Contact with Patient 08/20/14 1738     Chief Complaint  Patient presents with  . Fall  . Altered Mental Status     (Consider location/radiation/quality/duration/timing/severity/associated sxs/prior Treatment) The history is provided by the patient and the EMS personnel. The history is limited by the condition of the patient.  Patient arrives via ems, per report, was found on floor of his residence, lying on right side, with confusion/altered mental status, and blood sugar in 20's.  It is unknown how long pt on floor. Family had last heard from or seen 2 days ago. Pt mumbles incoherently, and is unable to answer questions - level 5 caveat.       Past Medical History  Diagnosis Date  . Bradycardia   . Chronic atrial fibrillation     Sick Sinus syndrome 1998  . Complete heart block     a. Initial placement 2005. s/p Medtronic pacemaker gen change 2013.  Marland Kitchen HTN (hypertension), benign   . Pacemaker-Medtronic   . Non-ischemic cardiomyopathy     last EF 35% by Echo at [facility] 2008  . Gout   . Elevated PSA     asymptomatic  . Corns and callosities   . Cognitive impairment     a. Taken off Coumadin due to this.  . Chronic combined systolic and diastolic CHF (congestive heart failure)   . CKD (chronic kidney disease), stage III    Past Surgical History  Procedure Laterality Date  . Pacemaker insertion  1998    power source change 2005  . Permanent pacemaker generator change N/A 03/05/2012    Procedure: PERMANENT PACEMAKER GENERATOR CHANGE;  Surgeon: Marinus Maw, MD;  Location: [facility] C[facility] LAB;  Service: Cardiovascular;  Laterality: N/A;   Family History  Problem Relation Age of Onset  . Heart failure Mother    History  Substance Use Topics  . Smoking status: Former Games developer  . Smokeless tobacco: Not on file  . Alcohol Use: No        Review of Systems  Unable to perform ROS: Mental status  change  level 5 caveat, altered mental status, pt unresponsive to questions.       Allergies  Asa  Home Medications   Prior to Admission medications   Medication Sig Start Date End Date Taking? Authorizing Provider  furosemide (LASIX) 40 MG tablet Take 1 tablet (40 mg total) by mouth 2 (two) times daily. 1 tablet early a.m. and one tablet midafternoon. 07/19/14   Rhonda G Barrett, PA-C  lisinopril (PRINIVIL,ZESTRIL) 5 MG tablet Take 1 tablet (5 mg total) by mouth daily. 07/19/14   Rhonda G Barrett, PA-C  Magnesium Oxide 400 MG CAPS Take 1 capsule (400 mg total) by mouth daily. 07/19/14   Rhonda G Barrett, PA-C  metoprolol succinate (TOPROL-XL) 50 MG 24 hr tablet Take 1 tablet (50 mg total) by mouth daily. Take with or immediately following a meal. 07/19/14   Rhonda G Barrett, PA-C  potassium chloride SA (K-DUR,KLOR-CON) 20 MEQ tablet Take 1 tablet (20 mEq total) by mouth daily. 07/26/14   Beatrice Lecher, PA-C   There were no vitals taken for this visit. Physical Exam  Constitutional: He appears well-developed and well-nourished. No distress.  HENT:  Head: Atraumatic.  Mouth/Throat: Oropharynx is clear and moist.  Eyes: Conjunctivae are normal. Pupils are equal, round, and reactive to light. No scleral icterus.  Neck: Neck supple.  No tracheal deviation present.  No stiffness or rigidity. No bruit. Arrives w c collar  Cardiovascular: Normal rate, regular rhythm, normal heart sounds and intact distal pulses.   Pulmonary/Chest: Effort normal and breath sounds normal. No accessory muscle usage. No respiratory distress. He exhibits no tenderness.  Abdominal: Soft. Bowel sounds are normal. He exhibits no distension and no mass. There is no tenderness. There is no rebound and no guarding.  Genitourinary:  No cva tenderness  Musculoskeletal: Normal range of motion.  CTLS spine, non tender, aligned, no step off. Good rom bil ext without focal pain or bony tenderness noted. Distal pulses palp.    Neurological: He is alert.  Awake, opens eyes spontaneously. Mumbles incoherently. Moves bil ext purposefully. Does not follow commands.   Skin: Skin is warm and dry. No rash noted. He is not diaphoretic.  Psychiatric:  Confused, slow to respond.   Nursing note and vitals reviewed.   ED Course  Procedures (including critical care time) Labs Review   Results for orders placed or performed during the hospital encounter of 08/20/14  CBC  Result Value Ref Range   WBC 9.7 4.0 - 10.5 K/uL   RBC 4.31 4.22 - 5.81 MIL/uL   Hemoglobin 13.3 13.0 - 17.0 g/dL   HCT 29.5 62.1 - 30.8 %   MCV 96.5 78.0 - 100.0 fL   MCH 30.9 26.0 - 34.0 pg   MCHC 32.0 30.0 - 36.0 g/dL   RDW 65.7 (H) 84.6 - 96.2 %   Platelets 107 (L) 150 - 400 K/uL  Comprehensive metabolic panel  Result Value Ref Range   Sodium 143 135 - 145 mmol/L   Potassium 5.3 (H) 3.5 - 5.1 mmol/L   Chloride 109 96 - 112 mmol/L   CO2 13 (L) 19 - 32 mmol/L   Glucose, Bld 84 70 - 99 mg/dL   BUN 71 (H) 6 - 23 mg/dL   Creatinine, Ser 9.52 (H) 0.50 - 1.35 mg/dL   Calcium 8.9 8.4 - 84.1 mg/dL   Total Protein 5.6 (L) 6.0 - 8.3 g/dL   Albumin 3.0 (L) 3.5 - 5.2 g/dL   AST 324 (H) 0 - 37 U/L   ALT 74 (H) 0 - 53 U/L   Alkaline Phosphatase 89 39 - 117 U/L   Total Bilirubin 2.3 (H) 0.3 - 1.2 mg/dL   GFR calc non Af Amer 23 (L) >90 mL/min   GFR calc Af Amer 26 (L) >90 mL/min   Anion gap 21 (H) 5 - 15  Troponin I  Result Value Ref Range   Troponin I 0.06 (H) <0.031 ng/mL  Protime-INR  Result Value Ref Range   Prothrombin Time 24.0 (H) 11.6 - 15.2 seconds   INR 2.13 (H) 0.00 - 1.49  CK  Result Value Ref Range   Total CK 614 (H) 7 - 232 U/L  CBG monitoring, ED  Result Value Ref Range   Glucose-Capillary 89 70 - 99 mg/dL  CBG monitoring, ED  Result Value Ref Range   Glucose-Capillary 59 (L) 70 - 99 mg/dL   Ct Head Wo Contrast  08/20/2014   CLINICAL DATA:  81 year old male with confusion after fall.  EXAM: CT HEAD WITHOUT CONTRAST  CT  CERVICAL SPINE WITHOUT CONTRAST  TECHNIQUE: Multidetector CT imaging of the head and cervical spine was performed following the standard protocol without intravenous contrast. Multiplanar CT image reconstructions of the cervical spine were also generated.  COMPARISON:  Head CT 11/09/2013  FINDINGS: CT HEAD FINDINGS  Patient  had difficulty tolerating the exam, moderate patient motion artifact. No intracranial hemorrhage, mass effect, or midline shift. No hydrocephalus. The basilar cisterns are patent. No evidence of acute infarct. 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 Unchanged atrophy and chronic small vessel ischemic change. No intracranial fluid collection. Atherosclerosis noted of the skullbase vasculature. Calvarium is intact. There is mild diffuse paranasal sinus mucosal thickening. The mastoid air cells are well aerated.  CT CERVICAL SPINE FINDINGS  Cervical spine alignment is maintained. Vertebral body heights are preserved. There is no fracture. The dens is intact. There are no jumped or perched facets. There is multilevel degenerative disc disease most significant at C6-C7. Multilevel facet arthropathy. No prevertebral soft tissue edema.  IMPRESSION: 1. No acute intracranial abnormality. Stable chronic findings including remote left MCA distribution infarct. 2. Degenerative change in the cervical spine without acute fracture or subluxation.   Electronically Signed   By: Rubye Oaks M.D.   On: 08/20/2014 22:11   Ct Cervical Spine Wo Contrast  08/20/2014   CLINICAL DATA:  81 year old male with confusion after fall.  EXAM: CT HEAD WITHOUT CONTRAST  CT CERVICAL SPINE WITHOUT CONTRAST  TECHNIQUE: Multidetector CT imaging of the head and cervical spine was performed following the standard protocol without intravenous contrast. Multiplanar CT image reconstructions of the cervical spine were also generated.  COMPARISON:  Head CT 11/09/2013  FINDINGS: CT HEAD FINDINGS  Patient had  difficulty tolerating the exam, moderate patient motion artifact. No intracranial hemorrhage, mass effect, or midline shift. No hydrocephalus. The basilar cisterns are patent. No evidence of acute infarct. 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 Unchanged atrophy and chronic small vessel ischemic change. No intracranial fluid collection. Atherosclerosis noted of the skullbase vasculature. Calvarium is intact. There is mild diffuse paranasal sinus mucosal thickening. The mastoid air cells are well aerated.  CT CERVICAL SPINE FINDINGS  Cervical spine alignment is maintained. Vertebral body heights are preserved. There is no fracture. The dens is intact. There are no jumped or perched facets. There is multilevel degenerative disc disease most significant at C6-C7. Multilevel facet arthropathy. No prevertebral soft tissue edema.  IMPRESSION: 1. No acute intracranial abnormality. Stable chronic findings including remote left MCA distribution infarct. 2. Degenerative change in the cervical spine without acute fracture or subluxation.   Electronically Signed   By: Rubye Oaks M.D.   On: 08/20/2014 22:11       EKG Interpretation   Date/Time:  Sunday August 20 2014 17:47:19 EDT Ventricular Rate:  97 PR Interval:    QRS Duration: 105 QT Interval:  445 QTC Calculation: 565 R Axis:   -98 Text Interpretation:  Atrial fibrillation Ventricular premature complex  LAD, consider left anterior fascicular block Prolonged QT interval No  significant change since last tracing Confirmed by Yliana Gravois  MD, Caryn Bee  ([id number]) on 08/20/2014 6:03:04 PM      MDM   Iv ns. Continuous pulse ox and monitor.   Labs. Ct.  Reviewed nursing notes and prior charts for additional history.   Ems reports initial glucose very low. D50. Glucose improved.  pts mental status mildly improved from prior.  Labs and imaging remains pending.  Temp low, warm blankets, bair hugger.   Resident placed peripheral iv  using ultrasound guidance -  I was present for procedure.  Ems had placed left humerus IO line - line discontinued. Sterile dressing.  Recheck abd soft nt.  Recheck spine non focal tenderness.  cxr remains pending. ua still pending.  Additional iv ns bolus.   Med service contacted for admission.  CRITICAL CARE  RE acute altered mental status, found on ground, elevated ck, acute kidney injury, metabolic acidosis, hypothermia, hypolgycemia. Performed by: Suzi Roots Total critical care time: 40 Critical care time was exclusive of separately billable procedures and treating other patients. Critical care was necessary to treat or prevent imminent or life-threatening deterioration. Critical care was time spent personally by me on the following activities: development of treatment plan with patient and/or surrogate as well as nursing, discussions with consultants, evaluation of patient's response to treatment, examination of patient, obtaining history from patient or surrogate, ordering and performing treatments and interventions, ordering and review of laboratory studies, ordering and review of radiographic studies, pulse oximetry and re-evaluation of patient's condition.       Cathren Laine, MD 08/20/14 [phone]

## 2014-08-20 NOTE — ED Notes (Signed)
Returned from CT.

## 2014-08-20 NOTE — ED Notes (Signed)
IV attempted without success. 

## 2014-08-20 NOTE — ED Notes (Signed)
CBG= 121

## 2014-08-20 NOTE — Progress Notes (Deleted)
Cardiology Office Note   Date:  08/20/2014   ID:  Khing Belcher, DOB 1924-09-23, MRN [id number]  PCP:  Pearla Dubonnet, MD  Cardiologist:  Dr. Verdis Prime   Electrophysiologist:  Dr. Lewayne Bunting   Chief Complaint  Patient presents with  . Congestive Heart Failure     History of Present Illness: Charles Hendricks is a 81 y.o. male with a hx of chronic atrial fibrillation, NICM with EF 35%, chronic combined CHF, cognitive impairment, HTN, CHB s/p PPM, CKD, non-compliance, frailty.  He is not a candidate for anticoagulation.    Admitted 07/2014 with a/c systolic CHF and R sided pleural effusion.  He was noted to have AF with RVR and NSVT at times.  Echo demonstrated EF 25-30%.  Patient has a poor social situation. He was seen by case management and social work.  SNF recommended by case management but patient and sister refused.  Home health arranged.    I saw him 07/26/14.  He was here with his sister. He had 2 separate bags of medications. They both seemed very confused about his medications. He admitted to eating frozen dinners every day.  His volume was stable.  I counseled him on a low salt diet and the importance of taking his medications.    He was in the ED last night after falling.  ***  He returns for FU.  ***   Studies/Reports Reviewed Today:  Echo 07/16/14 - EF 25% to 30%. Diffuse hypokinesis. - Ventricular septum: The contour showed diastolic flattening and systolic flattening. - Aortic valve: There was mild regurgitation. - Mitral valve: There was mild regurgitation. - Left atrium: The atrium was severely dilated. - Right ventricle: Systolic function was mildly reduced. - Right atrium: The atrium was severely dilated. - Atrial septum: There was an atrial septal aneurysm. - Tricuspid valve: There was severe regurgitation. - Pulmonary arteries: Systolic pressure was severely increased. - Pericardium, extracardiac: A trivial pericardial effusion was  identified.  Impressions:  Severe global reduction in LV function; severe biatrial enlargement; mild MR and AI; severe TR; severely elevated pulmonary pressure.    Past Medical History  Diagnosis Date  . Bradycardia   . Chronic atrial fibrillation     Sick Sinus syndrome 1998  . Complete heart block     a. Initial placement 2005. s/p Medtronic pacemaker gen change 2013.  Marland Kitchen HTN (hypertension), benign   . Pacemaker-Medtronic   . Non-ischemic cardiomyopathy     last EF 35% by Echo at [facility] 2008  . Gout   . Elevated PSA     asymptomatic  . Corns and callosities   . Cognitive impairment     a. Taken off Coumadin due to this.  . Chronic combined systolic and diastolic CHF (congestive heart failure)   . CKD (chronic kidney disease), stage III     Past Surgical History  Procedure Laterality Date  . Pacemaker insertion  1998    power source change 2005  . Permanent pacemaker generator change N/A 03/05/2012    Procedure: PERMANENT PACEMAKER GENERATOR CHANGE;  Surgeon: Marinus Maw, MD;  Location: [facility] CATH LAB;  Service: Cardiovascular;  Laterality: N/A;     Current Outpatient Prescriptions  Medication Sig Dispense Refill  . furosemide (LASIX) 40 MG tablet Take 1 tablet (40 mg total) by mouth 2 (two) times daily. 1 tablet early a.m. and one tablet midafternoon. 60 tablet 3  . lisinopril (PRINIVIL,ZESTRIL) 5 MG tablet Take 1 tablet (5 mg total) by mouth daily. 30  tablet 3  . Magnesium Oxide 400 MG CAPS Take 1 capsule (400 mg total) by mouth daily. 30 capsule 3  . metoprolol succinate (TOPROL-XL) 50 MG 24 hr tablet Take 1 tablet (50 mg total) by mouth daily. Take with or immediately following a meal. 30 tablet 3  . potassium chloride SA (K-DUR,KLOR-CON) 20 MEQ tablet Take 1 tablet (20 mEq total) by mouth daily. 90 tablet 3   No current facility-administered medications for this visit.    Allergies:   Asa    Social History:  The patient  reports that he has quit smoking. He does  not have any smokeless tobacco history on file. He reports that he does not drink alcohol.   Family History:  The patient's family history includes Heart failure in his mother.    ROS:   Please see the history of present illness.   ROS   PHYSICAL EXAM: VS:  There were no vitals taken for this visit.    Wt Readings from Last 3 Encounters:  07/26/14 121 lb (54.885 kg)  07/19/14 119 lb 6.4 oz (54.159 kg)  06/12/14 120 lb (54.432 kg)     GEN: Well nourished, well developed, in no acute distress HEENT: normal Neck: + JVD, no masses Cardiac:  Normal S1/S2, RRR; 2/6 systolic murmur LSB,  no rubs or gallops, 2+ edema  Respiratory:  Decreased breath sounds bilateral bases, no wheezing, rhonchi or rales. GI: soft, nontender, nondistended, + BS MS: no deformity or atrophy Skin: warm and dry  Neuro:  CNs II-XII intact, Strength and sensation are intact Psych: Normal affect   EKG:  EKG is ordered today.  It demonstrates:   ***   Recent Labs: 10/22/2013: Pro B Natriuretic peptide (BNP) 3408.0* 02/14/2014: TSH 2.95 07/15/2014: B Natriuretic Peptide 1049.7* 07/26/2014: Magnesium 1.3* 08/20/2014: ALT 74*; BUN 71*; Creatinine 2.39*; Hemoglobin 13.3; Platelets 107*; Potassium 5.3*; Sodium 143    Lipid Panel No results found for: CHOL, TRIG, HDL, CHOLHDL, VLDL, LDLCALC, LDLDIRECT    ASSESSMENT AND PLAN:  Chronic systolic congestive heart failure ***  NICM (nonischemic cardiomyopathy) Continue beta blocker and ACE inhibitor. He is not a candidate for AICD.  ***  Chronic atrial fibrillation Rate controlled. He is not a candidate for anticoagulation. ***  CKD (chronic kidney disease), unspecified stage Check basic metabolic panel today. ***  Pacemaker Follow up with EP as planned.  ***  Essential hypertension  Controlled.  ***   Current medicines are reviewed at length with the patient today.  The patient does not have concerns regarding medicines.  The following changes have  been made:  ***  Labs/ tests ordered today include: *** No orders of the defined types were placed in this encounter.    Disposition:   FU with Dr. Verdis Prime ***   Signed, Tereso Newcomer, PA-C, MHS 08/20/2014 9:14 PM    [facility] [address] [place], [place], [place]  [address] Phone: [phone]; Fax: [phone]

## 2014-08-21 ENCOUNTER — Encounter (HOSPITAL_COMMUNITY): Payer: Self-pay | Admitting: Internal Medicine

## 2014-08-21 ENCOUNTER — Inpatient Hospital Stay (HOSPITAL_COMMUNITY): Payer: Medicare Other

## 2014-08-21 ENCOUNTER — Encounter: Payer: Medicare Other | Admitting: Physician Assistant

## 2014-08-21 DIAGNOSIS — E162 Hypoglycemia, unspecified: Secondary | ICD-10-CM | POA: Diagnosis present

## 2014-08-21 DIAGNOSIS — N179 Acute kidney failure, unspecified: Secondary | ICD-10-CM

## 2014-08-21 DIAGNOSIS — E872 Acidosis, unspecified: Secondary | ICD-10-CM | POA: Diagnosis present

## 2014-08-21 DIAGNOSIS — T68XXXA Hypothermia, initial encounter: Secondary | ICD-10-CM

## 2014-08-21 DIAGNOSIS — G9340 Encephalopathy, unspecified: Secondary | ICD-10-CM

## 2014-08-21 LAB — RAPID URINE DRUG SCREEN, HOSP PERFORMED
AMPHETAMINES: NOT DETECTED
BENZODIAZEPINES: NOT DETECTED
Barbiturates: NOT DETECTED
Cocaine: NOT DETECTED
Opiates: NOT DETECTED
Tetrahydrocannabinol: NOT DETECTED

## 2014-08-21 LAB — GLUCOSE, CAPILLARY
GLUCOSE-CAPILLARY: 83 mg/dL (ref 70–99)
Glucose-Capillary: 130 mg/dL — ABNORMAL HIGH (ref 70–99)
Glucose-Capillary: 59 mg/dL — ABNORMAL LOW (ref 70–99)
Glucose-Capillary: 113 mg/dL — ABNORMAL HIGH (ref 70–99)
Glucose-Capillary: 126 mg/dL — ABNORMAL HIGH (ref 70–99)
Glucose-Capillary: 58 mg/dL — ABNORMAL LOW (ref 70–99)
Glucose-Capillary: 118 mg/dL — ABNORMAL HIGH (ref 70–99)
Glucose-Capillary: 119 mg/dL — ABNORMAL HIGH (ref 70–99)

## 2014-08-21 LAB — SALICYLATE LEVEL

## 2014-08-21 LAB — COMPREHENSIVE METABOLIC PANEL
ALT: 234 U/L — ABNORMAL HIGH (ref 0–53)
AST: 475 U/L — AB (ref 0–37)
Albumin: 2.6 g/dL — ABNORMAL LOW (ref 3.5–5.2)
Alkaline Phosphatase: 79 U/L (ref 39–117)
Anion gap: 9 (ref 5–15)
BUN: 74 mg/dL — ABNORMAL HIGH (ref 6–23)
CALCIUM: 8 mg/dL — AB (ref 8.4–10.5)
CO2: 21 mmol/L (ref 19–32)
CREATININE: 2.5 mg/dL — AB (ref 0.50–1.35)
Chloride: 112 mmol/L (ref 96–112)
GFR calc Af Amer: 25 mL/min — ABNORMAL LOW (ref >90)
GFR, EST NON AFRICAN AMERICAN: 21 mL/min — AB (ref >90)
Glucose, Bld: 135 mg/dL — ABNORMAL HIGH (ref 70–99)
Potassium: 5.2 mmol/L — ABNORMAL HIGH (ref 3.5–5.1)
Sodium: 142 mmol/L (ref 135–145)
Total Bilirubin: 1.9 mg/dL — ABNORMAL HIGH (ref 0.3–1.2)
Total Protein: 5.4 g/dL — ABNORMAL LOW (ref 6.0–8.3)

## 2014-08-21 LAB — CBC WITH DIFFERENTIAL/PLATELET
Basophils Absolute: 0 10*3/uL (ref 0.0–0.1)
Basophils Relative: 0 % (ref 0–1)
EOS PCT: 0 % (ref 0–5)
Eosinophils Absolute: 0 10*3/uL (ref 0.0–0.7)
HCT: 35.3 % — ABNORMAL LOW (ref 39.0–52.0)
Hemoglobin: 11.7 g/dL — ABNORMAL LOW (ref 13.0–17.0)
LYMPHS ABS: 0.6 10*3/uL — AB (ref 0.7–4.0)
LYMPHS PCT: 6 % — AB (ref 12–46)
MCH: 31 pg (ref 26.0–34.0)
MCHC: 33.1 g/dL (ref 30.0–36.0)
MCV: 93.4 fL (ref 78.0–100.0)
Monocytes Absolute: 0.5 10*3/uL (ref 0.1–1.0)
Monocytes Relative: 6 % (ref 3–12)
Neutro Abs: 8.6 10*3/uL — ABNORMAL HIGH (ref 1.7–7.7)
Neutrophils Relative %: 88 % — ABNORMAL HIGH (ref 43–77)
PLATELETS: 98 10*3/uL — AB (ref 150–400)
RBC: 3.78 MIL/uL — AB (ref 4.22–5.81)
RDW: 16.2 % — ABNORMAL HIGH (ref 11.5–15.5)
WBC: 9.7 10*3/uL (ref 4.0–10.5)

## 2014-08-21 LAB — URINE MICROSCOPIC-ADD ON

## 2014-08-21 LAB — MRSA PCR SCREENING: MRSA BY PCR: NEGATIVE

## 2014-08-21 LAB — LACTIC ACID, PLASMA
LACTIC ACID, VENOUS: 3.2 mmol/L — AB (ref 0.5–2.0)
LACTIC ACID, VENOUS: 4 mmol/L — AB (ref 0.5–2.0)
Lactic Acid, Venous: 4.3 mmol/L (ref 0.5–2.0)

## 2014-08-21 LAB — LIPID PANEL
CHOLESTEROL: 133 mg/dL (ref 0–200)
HDL: 44 mg/dL (ref >39)
LDL Cholesterol: 83 mg/dL (ref 0–99)
TRIGLYCERIDES: 28 mg/dL (ref <150)
Total CHOL/HDL Ratio: 3 RATIO
VLDL: 6 mg/dL (ref 0–40)

## 2014-08-21 LAB — URINALYSIS, ROUTINE W REFLEX MICROSCOPIC
Bilirubin Urine: NEGATIVE
Glucose, UA: NEGATIVE mg/dL
KETONES UR: NEGATIVE mg/dL
LEUKOCYTES UA: NEGATIVE
NITRITE: NEGATIVE
Specific Gravity, Urine: 1.022 (ref 1.005–1.030)
Urobilinogen, UA: 0.2 mg/dL (ref 0.0–1.0)
pH: 5 (ref 5.0–8.0)

## 2014-08-21 LAB — ETHANOL

## 2014-08-21 LAB — ACETAMINOPHEN LEVEL: Acetaminophen (Tylenol), Serum: <10 ug/mL — ABNORMAL LOW (ref 10–30)

## 2014-08-21 LAB — TROPONIN I
TROPONIN I: 0.1 ng/mL — AB (ref <0.031)
Troponin I: 0.15 ng/mL — ABNORMAL HIGH (ref <0.031)

## 2014-08-21 LAB — AMMONIA: AMMONIA: 29 umol/L (ref 11–32)

## 2014-08-21 LAB — PROCALCITONIN: Procalcitonin: 2.42 ng/mL

## 2014-08-21 LAB — MAGNESIUM: Magnesium: 2.1 mg/dL (ref 1.5–2.5)

## 2014-08-21 LAB — TSH: TSH: 2.592 u[IU]/mL (ref 0.350–4.500)

## 2014-08-21 LAB — CORTISOL: Cortisol, Plasma: 110.1 ug/dL

## 2014-08-21 LAB — LACTATE DEHYDROGENASE: LDH: 718 U/L — ABNORMAL HIGH (ref 94–250)

## 2014-08-21 LAB — HEPATITIS PANEL, ACUTE
HCV Ab: NEGATIVE
HEP B C IGM: NONREACTIVE
Hep A IgM: NONREACTIVE
Hepatitis B Surface Ag: NEGATIVE

## 2014-08-21 LAB — PROTEIN, URINE, RANDOM: TOTAL PROTEIN, URINE: 171 mg/dL

## 2014-08-21 LAB — CREATININE, URINE, RANDOM: Creatinine, Urine: 162.36 mg/dL

## 2014-08-21 LAB — CK: Total CK: 1486 U/L — ABNORMAL HIGH (ref 7–232)

## 2014-08-21 LAB — APTT: aPTT: 38 seconds — ABNORMAL HIGH (ref 24–37)

## 2014-08-21 MED ORDER — DEXTROSE 50 % IV SOLN
1.0000 | Freq: Once | INTRAVENOUS | Status: AC
  Filled 2014-08-21: qty 50

## 2014-08-21 MED ORDER — PIPERACILLIN-TAZOBACTAM IN DEX 2-0.25 GM/50ML IV SOLN
2.2500 g | Freq: Once | INTRAVENOUS | Status: AC
  Administered 2014-08-21: 2.25 g via INTRAVENOUS
  Filled 2014-08-21 (×2): qty 50

## 2014-08-21 MED ORDER — SODIUM CHLORIDE 0.9 % IV BOLUS (SEPSIS)
500.0000 mL | Freq: Once | INTRAVENOUS | Status: AC
  Administered 2014-08-21: 500 mL via INTRAVENOUS

## 2014-08-21 MED ORDER — SODIUM CHLORIDE 0.9 % IV BOLUS (SEPSIS): 500.0000 mL | Freq: Once | INTRAVENOUS | Status: DC

## 2014-08-21 MED ORDER — METOPROLOL SUCCINATE ER 50 MG PO TB24: 50.0000 mg | ORAL_TABLET | Freq: Every day | ORAL | Status: DC

## 2014-08-21 MED ORDER — SODIUM CHLORIDE 0.9 % IV SOLN
Freq: Once | INTRAVENOUS | Status: AC
  Administered 2014-08-21: 05:00:00 via INTRAVENOUS

## 2014-08-21 MED ORDER — SENNOSIDES-DOCUSATE SODIUM 8.6-50 MG PO TABS
1.0000 | ORAL_TABLET | Freq: Every evening | ORAL | Status: DC | PRN
  Filled 2014-08-21: qty 1

## 2014-08-21 MED ORDER — PIPERACILLIN-TAZOBACTAM IN DEX 2-0.25 GM/50ML IV SOLN
2.2500 g | Freq: Three times a day (TID) | INTRAVENOUS | Status: DC
  Administered 2014-08-21 – 2014-08-26 (×15): 2.25 g via INTRAVENOUS
  Filled 2014-08-21 (×19): qty 50

## 2014-08-21 MED ORDER — DEXTROSE-NACL 5-0.9 % IV SOLN
INTRAVENOUS | Status: DC
  Administered 2014-08-21 – 2014-08-22 (×3): via INTRAVENOUS

## 2014-08-21 MED ORDER — CHLORHEXIDINE GLUCONATE 0.12 % MT SOLN
15.0000 mL | Freq: Two times a day (BID) | OROMUCOSAL | Status: DC
  Administered 2014-08-21 – 2014-08-26 (×10): 15 mL via OROMUCOSAL
  Filled 2014-08-21 (×13): qty 15

## 2014-08-21 MED ORDER — PIPERACILLIN-TAZOBACTAM 3.375 G IVPB 30 MIN: 3.3750 g | Freq: Once | INTRAVENOUS | Status: DC

## 2014-08-21 MED ORDER — SODIUM CHLORIDE 0.9 % IV SOLN
INTRAVENOUS | Status: DC
  Administered 2014-08-21: 1000 mL via INTRAVENOUS
  Administered 2014-08-21: 08:00:00 via INTRAVENOUS

## 2014-08-21 MED ORDER — DEXTROSE 5 % IV SOLN: 1.0000 g | Freq: Once | INTRAVENOUS | Status: DC

## 2014-08-21 MED ORDER — VANCOMYCIN HCL IN DEXTROSE 1-5 GM/200ML-% IV SOLN
1000.0000 mg | Freq: Once | INTRAVENOUS | Status: AC
  Administered 2014-08-21: 1000 mg via INTRAVENOUS
  Filled 2014-08-21: qty 200

## 2014-08-21 MED ORDER — DEXTROSE 50 % IV SOLN
25.0000 mL | Freq: Once | INTRAVENOUS | Status: AC
  Administered 2014-08-21: 25 mL via INTRAVENOUS

## 2014-08-21 MED ORDER — METOPROLOL TARTRATE 12.5 MG HALF TABLET
12.5000 mg | ORAL_TABLET | Freq: Two times a day (BID) | ORAL | Status: DC
  Filled 2014-08-21 (×3): qty 1

## 2014-08-21 MED ORDER — DEXTROSE 50 % IV SOLN
INTRAVENOUS | Status: AC
  Filled 2014-08-21: qty 50

## 2014-08-21 MED ORDER — SODIUM CHLORIDE 0.9 % IV SOLN
500.0000 mg | INTRAVENOUS | Status: DC
  Administered 2014-08-22 – 2014-08-26 (×5): 500 mg via INTRAVENOUS
  Filled 2014-08-21 (×7): qty 500

## 2014-08-21 MED ORDER — CETYLPYRIDINIUM CHLORIDE 0.05 % MT LIQD
7.0000 mL | Freq: Two times a day (BID) | OROMUCOSAL | Status: DC
  Administered 2014-08-21 – 2014-08-26 (×11): 7 mL via OROMUCOSAL

## 2014-08-21 NOTE — Progress Notes (Addendum)
[facility] PROGRESS NOTE  Charles Hendricks WJX:[id number] DOB: 11-Feb-1925 DOA: 08/20/2014 PCP: Pearla Dubonnet, MD  Charles Hendricks is a 81 y.o. male with history of nonischemic cardiomyopathy last year admission was 35%, chronic atrial fibrillation and complete heart block status post pacemaker placement and was felt to be a not a candidate for anticoagulation secondary to falls, hypertension, chronic kidney disease was brought to the ER after patient's family found that patient was on the floor. Patient's family went to check on patient and patient was not opening the door and had to call the police following which patient was found to be on the floor confused. Patient was brought to the ER and CT head and C-spine was unremarkable. Patient was found to be hypothermic hypoglycemic and hypotensive. Lactic acid was around 8  Assessment/Plan: 1. Encephalopathy   -etiology unclear, sepsis is possible, hypoglycemia also possibility - continue IV VAnc/cefepime - IVF, FU lactic acid -PCCM consulting -TSh and Cortisol WNL -CT head unremarkable -unable to have MRI due to pacer -EEG done will FU  2. Elevated troponin with history of nonischemic cardiomyopathy    -likley due to sepsis, non ACS pattern,    -allergic to  ASA, add low dose BB, check ECHO.  3. Acute on chronic kidney disease stage III   - continue IVF and hold antihypertensives   - baseline creatinine 1.5-2  4. History of hypertension    - hold antihypertensives  5. History of chronic atrial fibrillation and complete heart block status post pacemaker placement - patient was not felt to be a candidate for anti-coagulation secondary to risk of falls  6. Thrombocytopenia     -likely due to sepsis  7. Elevated LFTs -likely due to shock liver, monitor -was hypotensive overnight  8. Skin tear on the right anterior shin - wound team consult.  9. Dementia/cognitive impairment   Code Status: Full Code Family  Communication:none at bedside, called and d/w sister Disposition Plan: Keep in SDU   Consultants:  PCCM  Antibiotics:  Vanc/Zosyn  HPI/Subjective: Remains confused  Objective: Filed Vitals:   08/21/14 1231  BP: 116/65  Pulse: 78  Temp: 98.6 F (37 C)  Resp: 20    Intake/Output Summary (Last 24 hours) at 08/21/14 1309 Last data filed at 08/21/14 1156  Gross per 24 hour  Intake 5368.75 ml  Output    225 ml  Net 5143.75 ml   Filed Weights   08/20/14 2300 08/21/14 0115  Weight: 54.9 kg (121 lb 0.5 oz) 63.1 kg (139 lb 1.8 oz)    Exam:   General:  Confused, lethargic, but arousible  Cardiovascular: S1S2/RRR  Respiratory: CTAB  Abdomen: soft, BT, Bs present  Musculoskeletal: no edema c/c  Neuro: exam limited due to mentation, moves all extremities  Data Reviewed: Basic Metabolic Panel:  Recent Labs Lab 08/20/14 1914 08/20/14 2305 08/21/14 0431  NA 143  --  142  K 5.3*  --  5.2*  CL 109  --  112  CO2 13*  --  21  GLUCOSE 84  --  135*  BUN 71*  --  74*  CREATININE 2.39*  --  2.50*  CALCIUM 8.9  --  8.0*  MG  --  2.1  --    Liver Function Tests:  Recent Labs Lab 08/20/14 1914 08/21/14 0431  AST 135* 475*  ALT 74* 234*  ALKPHOS 89 79  BILITOT 2.3* 1.9*  PROT 5.6* 5.4*  ALBUMIN 3.0* 2.6*   No results for input(s): LIPASE, AMYLASE in  the last 168 hours.  Recent Labs Lab 08/21/14 0431  AMMONIA 29   CBC:  Recent Labs Lab 08/20/14 1914 08/21/14 0431  WBC 9.7 9.7  NEUTROABS  --  8.6*  HGB 13.3 11.7*  HCT 41.6 35.3*  MCV 96.5 93.4  PLT 107* 98*   Cardiac Enzymes:  Recent Labs Lab 08/20/14 1914 08/21/14 0431  CKTOTAL 614*  --   TROPONINI 0.06* 0.10*   BNP (last 3 results)  Recent Labs  07/15/14 1531  BNP 1049.7*    ProBNP (last 3 results)  Recent Labs  10/22/13 1600  PROBNP 3408.0*    CBG:  Recent Labs Lab 08/20/14 2327 08/21/14 0409 08/21/14 0554 08/21/14 0827 08/21/14 0938  GLUCAP 121* 130* 83 59*  113*    Recent Results (from the past 240 hour(s))  MRSA PCR Screening     Status: None   Collection Time: 08/21/14  1:13 AM  Result Value Ref Range Status   MRSA by PCR NEGATIVE NEGATIVE Final    Comment:        The GeneXpert MRSA Assay (FDA approved for NASAL specimens only), is one component of a comprehensive MRSA colonization surveillance program. It is not intended to diagnose MRSA infection nor to guide or monitor treatment for MRSA infections.      Studies: Dg Chest 2 View  08/20/2014   CLINICAL DATA:  Found on floor in bathroom, lower extremity edema.  EXAM: CHEST  2 VIEW  COMPARISON:  07/15/2014  FINDINGS: Single lead left-sided pacemaker in place. The heart is enlarged, likely progressed from prior exam. Small right pleural effusion, slightly decreased in degree from prior exam. Probable tiny left pleural effusion. Decreased vascular congestion. No pneumothorax. No confluent airspace disease.  IMPRESSION: Decreased right pleural effusion and vascular congestion, however probable slight increase in cardiomegaly.   Electronically Signed   By: Rubye Oaks M.D.   On: 08/20/2014 23:21   Ct Head Wo Contrast  08/20/2014   CLINICAL DATA:  81 year old male with confusion after fall.  EXAM: CT HEAD WITHOUT CONTRAST  CT CERVICAL SPINE WITHOUT CONTRAST  TECHNIQUE: Multidetector CT imaging of the head and cervical spine was performed following the standard protocol without intravenous contrast. Multiplanar CT image reconstructions of the cervical spine were also generated.  COMPARISON:  Head CT 11/09/2013  FINDINGS: CT HEAD FINDINGS  Patient had difficulty tolerating the exam, moderate patient motion artifact. No intracranial hemorrhage, mass effect, or midline shift. No hydrocephalus. The basilar cisterns are patent. No evidence of acute infarct. 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 Unchanged atrophy and chronic small vessel ischemic change. No intracranial fluid  collection. Atherosclerosis noted of the skullbase vasculature. Calvarium is intact. There is mild diffuse paranasal sinus mucosal thickening. The mastoid air cells are well aerated.  CT CERVICAL SPINE FINDINGS  Cervical spine alignment is maintained. Vertebral body heights are preserved. There is no fracture. The dens is intact. There are no jumped or perched facets. There is multilevel degenerative disc disease most significant at C6-C7. Multilevel facet arthropathy. No prevertebral soft tissue edema.  IMPRESSION: 1. No acute intracranial abnormality. Stable chronic findings including remote left MCA distribution infarct. 2. Degenerative change in the cervical spine without acute fracture or subluxation.   Electronically Signed   By: Rubye Oaks M.D.   On: 08/20/2014 22:11   Ct Cervical Spine Wo Contrast  08/20/2014   CLINICAL DATA:  81 year old male with confusion after fall.  EXAM: CT HEAD WITHOUT CONTRAST  CT CERVICAL SPINE  WITHOUT CONTRAST  TECHNIQUE: Multidetector CT imaging of the head and cervical spine was performed following the standard protocol without intravenous contrast. Multiplanar CT image reconstructions of the cervical spine were also generated.  COMPARISON:  Head CT 11/09/2013  FINDINGS: CT HEAD FINDINGS  Patient had difficulty tolerating the exam, moderate patient motion artifact. No intracranial hemorrhage, mass effect, or midline shift. No hydrocephalus. The basilar cisterns are patent. No evidence of acute infarct. 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 Unchanged atrophy and chronic small vessel ischemic change. No intracranial fluid collection. Atherosclerosis noted of the skullbase vasculature. Calvarium is intact. There is mild diffuse paranasal sinus mucosal thickening. The mastoid air cells are well aerated.  CT CERVICAL SPINE FINDINGS  Cervical spine alignment is maintained. Vertebral body heights are preserved. There is no fracture. The dens is intact. There  are no jumped or perched facets. There is multilevel degenerative disc disease most significant at C6-C7. Multilevel facet arthropathy. No prevertebral soft tissue edema.  IMPRESSION: 1. No acute intracranial abnormality. Stable chronic findings including remote left MCA distribution infarct. 2. Degenerative change in the cervical spine without acute fracture or subluxation.   Electronically Signed   By: Rubye Oaks M.D.   On: 08/20/2014 22:11   Dg Chest Port 1 View  08/21/2014   CLINICAL DATA:  Pneumonia and shortness of breath  EXAM: PORTABLE CHEST - 1 VIEW  COMPARISON:  08/20/2014  FINDINGS: Cardiopericardial enlargement is unchanged from yesterday. Stable aortic and hilar contours. A single chamber pacer lead from the left is in stable position when accounting for differences in technique. Stable small right pleural effusion with underlying interstitial coarsening. Pleural thickening along the lateral right chest wall is also unchanged. No definitive pneumonia. No edema or pneumothorax.  IMPRESSION: Stable small right pleural effusion.   Electronically Signed   By: Marnee Spring M.D.   On: 08/21/2014 06:48    Scheduled Meds: . antiseptic oral rinse  7 mL Mouth Rinse q12n4p  . chlorhexidine  15 mL Mouth Rinse BID  . piperacillin-tazobactam (ZOSYN)  IV  2.25 g Intravenous Q8H   Continuous Infusions: . dextrose 5 % and 0.9% NaCl 100 mL/hr at 08/21/14 0855   Antibiotics Given (last 72 hours)    Date/Time Action Medication Dose Rate   08/21/14 0445 Given   vancomycin (VANCOCIN) IVPB 1000 mg/200 mL premix 1,000 mg 200 mL/hr   08/21/14 0447 Given   piperacillin-tazobactam (ZOSYN) IVPB 2.25 g 2.25 g 100 mL/hr   08/21/14 1148 Given   piperacillin-tazobactam (ZOSYN) IVPB 2.25 g 2.25 g 100 mL/hr      Principal Problem:   Acute encephalopathy Active Problems:   AKI (acute kidney injury)   Hypoglycemia   Hypothermia   Lactic acidosis    Time spent:    [facility]  [facility] Pager [phone]. If 7PM-7AM, please contact night-coverage at [email or web address], password [facility] 08/21/2014, 1:09 PM  LOS: 1 day

## 2014-08-21 NOTE — Progress Notes (Signed)
CRITICAL VALUE ALERT  Critical value received:  Lactic Acid 3.2  Date of notification:  08/21/14  Time of notification:  1625  Critical value read back:yes  Nurse who received alert:  Dyke Brackett  MD notified (1st page):  Dr. Jomarie Longs  Time of first page: 1626  MD notified (2nd page):  Time of second page:  Responding MD:  Dr. Jomarie Longs  Time MD responded:  1630

## 2014-08-21 NOTE — Progress Notes (Signed)
CRITICAL VALUE ALERT  Critical value received:  CBG 58  Date of notification:  08/21/14  Time of notification:  1601  Critical value read back:yes  Nurse who received alert:  Dyke Brackett  MD notified (1st page):  Dr. Jomarie Longs  Time of first page:  1603  MD notified (2nd page):  Time of second page:  Responding MD:  Dr. Jomarie Longs  Time MD responded:  [phone]

## 2014-08-21 NOTE — Progress Notes (Addendum)
ANTIBIOTIC CONSULT NOTE - INITIAL  Pharmacy Consult for vancomycin and Zosyn Indication: rule out sepsis  Allergies  Allergen Reactions  . Asa [Aspirin] Other (See Comments)    Told not to take med-per MD    Patient Measurements: Height: 5\' 11"  (180.3 cm) Weight: 121 lb 0.5 oz (54.9 kg) IBW/kg (Calculated) : 75.3  Vital Signs: Temp: 95.3 F (35.2 C) (04/10 2233) Temp Source: Rectal (04/10 2233) BP: 117/59 mmHg (04/10 1819) Pulse Rate: 80 (04/10 1819)  Labs:  Recent Labs  08/20/14 1914  WBC 9.7  HGB 13.3  PLT 107*  CREATININE 2.39*   Estimated Creatinine Clearance: 16.6 mL/min (by C-G formula based on Cr of 2.39).  Medical History: Past Medical History  Diagnosis Date  . Bradycardia   . Chronic atrial fibrillation     Sick Sinus syndrome 1998  . Complete heart block     a. Initial placement 2005. s/p Medtronic pacemaker gen change 2013.  Marland Kitchen HTN (hypertension), benign   . Pacemaker-Medtronic   . Non-ischemic cardiomyopathy     last EF 35% by Echo at [facility] 2008  . Gout   . Elevated PSA     asymptomatic  . Corns and callosities   . Cognitive impairment     a. Taken off Coumadin due to this.  . Chronic combined systolic and diastolic CHF (congestive heart failure)   . CKD (chronic kidney disease), stage III     Assessment: 81yo male was found on floor in bedroom after family couldn't get in contact with pt, on arrival to ED had edema of all extremities and face, lactic acid significantly elevated, WBC only 9.7 but was 4.6 on [facility] last records 56mo ago, to begin IV ABX; also noted w/ AKI, baseline SCr ~1.5, now 2.39.  Goal of Therapy:  Vancomycin trough level 15-20 mcg/ml  Plan:  Will give vancomycin 1g IV x1 and Zosyn 2.25g IV Q8H and f/u w/ am labs to assess renal function prior to addressing further vanc doses.  Vernard Gambles, PharmD, BCPS  08/21/2014,12:46 AM

## 2014-08-21 NOTE — Progress Notes (Signed)
CRITICAL VALUE ALERT  Critical value received:  CBG 59  Date of notification:  08/21/14  Time of notification:  0837  Critical value read back: yes  Nurse who received alert:  Dyke Brackett  MD notified (1st page): Dr. Jomarie Longs  Time of first page:  [phone]  MD notified (2nd page):  Time of second page:  Responding MD:  Dr. Jomarie Longs  Time MD responded:  [phone]

## 2014-08-21 NOTE — Procedures (Signed)
ELECTROENCEPHALOGRAM REPORT   Patient: Charles Hendricks       Room #: 9X50 EEG No. ID: [id number] Age: 81 y.o.        Sex: male Referring Physician: Jomarie Longs Report Date:  08/21/2014        Interpreting Physician: Thana Farr  History: [name] is an 81 y.o. male with altered mental status  Medications:  Scheduled: . antiseptic oral rinse  7 mL Mouth Rinse q12n4p  . chlorhexidine  15 mL Mouth Rinse BID  . piperacillin-tazobactam (ZOSYN)  IV  2.25 g Intravenous Q8H    Conditions of Recording:  This is a 16 channel EEG carried out with the patient in the confused state.  Description:  The waking background activity is dominated by artifact.  There are only rare periods during the recording when the background activity can be evaluated.  During these short periods the background activity is slow and poorly organized consisting mostly of low voltage delta activity, although some intermixed theta activity can be noted as well.  This activity is diffusely distributed.  No epileptiform activity is noted.    There no evidence of normal stage II sleep. Hyperventilation and intermittent photic stimulation were not performed.   IMPRESSION: This is a technically difficult electroencephalogram secondary to the predominance of muscle and movement artifact.  When background activity can be evaluated general background slowing is noted.  This finding may be seen with a diffuse disturbance that is etiologically nonspecific, but may include a metabolic encephalopathy, among other possibilities.  No epileptiform activity was noted.     Thana Farr, MD [facility] [phone] 08/21/2014, 12:20 PM

## 2014-08-21 NOTE — Progress Notes (Signed)
ANTIBIOTIC CONSULT NOTE - follow up Pharmacy Consult for vancomycin and Zosyn Indication: rule out sepsis  Allergies  Allergen Reactions  . Asa [Aspirin] Other (See Comments)    Told not to take med-per MD    Patient Measurements: Height: 5\' 11"  (180.3 cm) Weight: 139 lb 1.8 oz (63.1 kg) IBW/kg (Calculated) : 75.3  Vital Signs: Temp: 98.6 F (37 C) (04/11 1231) Temp Source: Rectal (04/11 1231) BP: 116/65 mmHg (04/11 1231) Pulse Rate: 78 (04/11 1231)  Labs:  Recent Labs  08/20/14 1914 08/21/14 0431 08/21/14 0632  WBC 9.7 9.7  --   HGB 13.3 11.7*  --   PLT 107* 98*  --   [facility]  --   --  162.36  CREATININE 2.39* 2.50*  --    Estimated Creatinine Clearance: 18.2 mL/min (by C-G formula based on Cr of 2.5).   Assessment: 81yo male was found on floor in bedroom after family couldn't get in contact with pt, on arrival to ED had edema of all extremities and face, lactic acid significantly elevated, WBC only 9.7 but was 4.6 on [facility] last records 29mo ago, to begin IV ABX; also noted w/ AKI, baseline SCr ~1.5, now 2.39> 2.5.  UOP 0.5 ml/kg/hr.  Possible HCAP: lungs clear, CXR neg; improving R pleural effusion. Covering for HCAP w/ recent hospitalization in March SIRS: no obvious source of infxn: [place] 874 on admit; now normothermic from hypothermia; creat cl ~ 18 ml/min  Vanc 4/11>> Zosyn 4/11>>  4/11 Ucx>> 4/11 Bcxx2>>  Goal of Therapy:  vanc trough 15 -20 mcg/ml for Sepsis  Plan:  Vanc 1 gm x 1 given 4/11 at 0445, then 500 IV q24 Continue zosyn 2.25 q8h, F/u renal fxn, temp, culture data vanc trough as needed  Herby Abraham, Pharm.D. [phone] 08/21/2014 1:42 PM

## 2014-08-21 NOTE — H&P (Addendum)
[facility] History and Physical  Charles Hendricks BJY:[id number] DOB: Apr 26, 1925 DOA: 08/20/2014  Referring physician: ER physician. PCP: Pearla Dubonnet, MD   History obtained from patient's sister and ER physician and previous records.  Chief Complaint: Altered mental status.  HPI: Charles Hendricks is a 81 y.o. male with history of nonischemic cardiomyopathy last year admission was 35%, chronic atrial fibrillation and complete heart block status post pacemaker placement and was felt to be a not a candidate for anticoagulation secondary to falls, hypertension, chronic kidney disease was brought to the ER after patient's family found that patient was on the floor. Patient's family went to check on patient and patient was not opening the door and had to call the police following which patient was found to be on the floor confused. Patient was brought to the ER and CT head and C-spine was unremarkable. Patient was found to be hypothermic hypoglycemic and hypotensive. Lactic acid was around 8. Patient was given so far treated as normalcy and bolus and repeat lactic acid levels are pending. Patient has a skin tear on his right anterior shin. On exam patient is still confused and does not follow commands. Charles positive. Patient was recently admitted for CHF exacerbation. Patient's family states that he was doing fine 2 days ago. Patient was able to recognize his pastor when he had come to the ER. At this moment patient is still confused otherwise. Patient's creatinine has increased from baseline. It is not shows patient was taking his medications.   Review of Systems: As presented in the history of presenting illness, rest negative.  Past Medical History  Diagnosis Date  . Bradycardia   . Chronic atrial fibrillation     Sick Sinus syndrome 1998  . Complete heart block     a. Initial placement 2005. s/p Medtronic pacemaker gen change 2013.  Marland Kitchen HTN (hypertension), benign   . Pacemaker-Medtronic    . Non-ischemic cardiomyopathy     last EF 35% by Echo at [facility] 2008  . Gout   . Elevated PSA     asymptomatic  . Corns and callosities   . Cognitive impairment     a. Taken off Coumadin due to this.  . Chronic combined systolic and diastolic CHF (congestive heart failure)   . CKD (chronic kidney disease), stage III    Past Surgical History  Procedure Laterality Date  . Pacemaker insertion  1998    power source change 2005  . Permanent pacemaker generator change N/A 03/05/2012    Procedure: PERMANENT PACEMAKER GENERATOR CHANGE;  Surgeon: Marinus Maw, MD;  Location: [facility] CATH LAB;  Service: Cardiovascular;  Laterality: N/A;   Social History:  reports that he has quit smoking. He does not have any smokeless tobacco history on file. He reports that he does not drink alcohol. His drug history is not on file. Where does patient live home. Can patient participate in ADLs? Yes.  Allergies  Allergen Reactions  . Asa [Aspirin] Other (See Comments)    Told not to take med-per MD    Family History:  Family History  Problem Relation Age of Onset  . Heart failure Mother       Prior to Admission medications   Medication Sig Start Date End Date Taking? Authorizing Provider  furosemide (LASIX) 40 MG tablet Take 1 tablet (40 mg total) by mouth 2 (two) times daily. 1 tablet early a.m. and one tablet midafternoon. 07/19/14  Yes Charles G Barrett, PA-C  lisinopril (PRINIVIL,ZESTRIL) 5 MG tablet Take  1 tablet (5 mg total) by mouth daily. 07/19/14  Yes Charles G Barrett, PA-C  Magnesium Oxide 400 MG CAPS Take 1 capsule (400 mg total) by mouth daily. 07/19/14  Yes Charles G Barrett, PA-C  metoprolol succinate (TOPROL-XL) 50 MG 24 hr tablet Take 1 tablet (50 mg total) by mouth daily. Take with or immediately following a meal. 07/19/14  Yes Charles G Barrett, PA-C  potassium chloride SA (K-DUR,KLOR-CON) 20 MEQ tablet Take 1 tablet (20 mEq total) by mouth daily. 07/26/14  Yes Charles Lecher, PA-C    Physical  Exam: Filed Vitals:   08/20/14 1819 08/20/14 1933 08/20/14 2233 08/20/14 2300  BP: 117/59     Pulse: 80     Temp:  93.9 F (34.4 C) 95.3 F (35.2 C)   TempSrc:  Rectal Rectal   Resp: 24     Height:    5\' 11"  (1.803 m)  Weight:    54.9 kg (121 lb 0.5 oz)     General:  Moderately built and nourished.  Eyes: Anicteric no pallor. PERRLA positive.  ENT: No discharge from the ears eyes nose and mouth.  Neck: No mass felt.  Cardiovascular: S1 and S2 heard.  Respiratory: No rhonchi or crepitations.  Abdomen: Soft nontender bowel sounds present.  Skin: Skin tear on the right anterior shin.  Musculoskeletal: No edema.  Psychiatric: Patient is confused.  Neurologic: Patient is confused and does not follow commands.  Labs on Admission:  Basic Metabolic Panel:  Recent Labs Lab 08/20/14 1914 08/20/14 2305  NA 143  --   K 5.3*  --   CL 109  --   CO2 13*  --   GLUCOSE 84  --   BUN 71*  --   CREATININE 2.39*  --   CALCIUM 8.9  --   MG  --  2.1   Liver Function Tests:  Recent Labs Lab 08/20/14 1914  AST 135*  ALT 74*  ALKPHOS 89  BILITOT 2.3*  PROT 5.6*  ALBUMIN 3.0*   No results for input(s): LIPASE, AMYLASE in the last 168 hours. No results for input(s): AMMONIA in the last 168 hours. CBC:  Recent Labs Lab 08/20/14 1914  WBC 9.7  HGB 13.3  HCT 41.6  MCV 96.5  PLT 107*   Cardiac Enzymes:  Recent Labs Lab 08/20/14 1914  CKTOTAL 614*  TROPONINI 0.06*    BNP (last 3 results)  Recent Labs  07/15/14 1531  BNP 1049.7*    ProBNP (last 3 results)  Recent Labs  10/22/13 1600  PROBNP 3408.0*    CBG:  Recent Labs Lab 08/20/14 1755 08/20/14 2029 08/20/14 2327  GLUCAP 89 59* 121*    Radiological Exams on Admission: Dg Chest 2 View  08/20/2014   CLINICAL DATA:  Found on floor in bathroom, lower extremity edema.  EXAM: CHEST  2 VIEW  COMPARISON:  07/15/2014  FINDINGS: Single lead left-sided pacemaker in place. The heart is enlarged,  likely progressed from prior exam. Small right pleural effusion, slightly decreased in degree from prior exam. Probable tiny left pleural effusion. Decreased vascular congestion. No pneumothorax. No confluent airspace disease.  IMPRESSION: Decreased right pleural effusion and vascular congestion, however probable slight increase in cardiomegaly.   Electronically Signed   By: Rubye Oaks M.D.   On: 08/20/2014 23:21   Ct Head Wo Contrast  08/20/2014   CLINICAL DATA:  81 year old male with confusion after fall.  EXAM: CT HEAD WITHOUT CONTRAST  CT CERVICAL SPINE WITHOUT CONTRAST  TECHNIQUE: Multidetector CT imaging of the head and cervical spine was performed following the standard protocol without intravenous contrast. Multiplanar CT image reconstructions of the cervical spine were also generated.  COMPARISON:  Head CT 11/09/2013  FINDINGS: CT HEAD FINDINGS  Patient had difficulty tolerating the exam, moderate patient motion artifact. No intracranial hemorrhage, mass effect, or midline shift. No hydrocephalus. The basilar cisterns are patent. No evidence of acute infarct. 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 Unchanged atrophy and chronic small vessel ischemic change. No intracranial fluid collection. Atherosclerosis noted of the skullbase vasculature. Calvarium is intact. There is mild diffuse paranasal sinus mucosal thickening. The mastoid air cells are well aerated.  CT CERVICAL SPINE FINDINGS  Cervical spine alignment is maintained. Vertebral body heights are preserved. There is no fracture. The dens is intact. There are no jumped or perched facets. There is multilevel degenerative disc disease most significant at C6-C7. Multilevel facet arthropathy. No prevertebral soft tissue edema.  IMPRESSION: 1. No acute intracranial abnormality. Stable chronic findings including remote left MCA distribution infarct. 2. Degenerative change in the cervical spine without acute fracture or subluxation.    Electronically Signed   By: Rubye Oaks M.D.   On: 08/20/2014 22:11   Ct Cervical Spine Wo Contrast  08/20/2014   CLINICAL DATA:  81 year old male with confusion after fall.  EXAM: CT HEAD WITHOUT CONTRAST  CT CERVICAL SPINE WITHOUT CONTRAST  TECHNIQUE: Multidetector CT imaging of the head and cervical spine was performed following the standard protocol without intravenous contrast. Multiplanar CT image reconstructions of the cervical spine were also generated.  COMPARISON:  Head CT 11/09/2013  FINDINGS: CT HEAD FINDINGS  Patient had difficulty tolerating the exam, moderate patient motion artifact. No intracranial hemorrhage, mass effect, or midline shift. No hydrocephalus. The basilar cisterns are patent. No evidence of acute infarct. 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 Unchanged atrophy and chronic small vessel ischemic change. No intracranial fluid collection. Atherosclerosis noted of the skullbase vasculature. Calvarium is intact. There is mild diffuse paranasal sinus mucosal thickening. The mastoid air cells are well aerated.  CT CERVICAL SPINE FINDINGS  Cervical spine alignment is maintained. Vertebral body heights are preserved. There is no fracture. The dens is intact. There are no jumped or perched facets. There is multilevel degenerative disc disease most significant at C6-C7. Multilevel facet arthropathy. No prevertebral soft tissue edema.  IMPRESSION: 1. No acute intracranial abnormality. Stable chronic findings including remote left MCA distribution infarct. 2. Degenerative change in the cervical spine without acute fracture or subluxation.   Electronically Signed   By: Rubye Oaks M.D.   On: 08/20/2014 22:11    EKG: Independently reviewed. Atrial fibrillation controlled rate.  Assessment/Plan Principal Problem:   Acute encephalopathy Active Problems:   AKI (acute kidney injury)   Hypoglycemia   Hypothermia   Lactic acidosis   1. Acute encephalopathy  with possible sepsis - at this time patient lactic acid is markedly elevated and patient is hypothermic hypoglycemic and hypotensive. For now we have ordered blood cultures procalcitonin levels urine cultures and I have placed patient in [place] on vancomycin and cefepime. Place patient on warming blankets until temperature comes up and entry with hydration caution that patient does have systolic heart failure and closely watch for respiratory status. I have consulted critical care for further recommendation. Other differentials include stroke versus seizure. I placed patient on neuro checks and swallow evaluation. Get EEG and since patient has pacemaker cannot get MRI brain May  repeat CT head later today. Since patient was hypoglycemic we will closely follow CBGs and since patient is hypothermic we will check in addition to blood cultures cortisol levels and TSH levels. 2. Elevated troponin with history of nonischemic cardiomyopathy - cycle cardiac markers at this time. Check 2-D echo. Aspirin. 3. Acute on chronic kidney disease stage III - continue with hydration and hold antihypertensives for now since patient is hypotensive and closely follow intake output and metabolic panel. 4. History of hypertension presently hypotensive hold antihypertensives. 5. History of chronic atrial fibrillation and complete heart block status post pacemaker placement - patient was felt to be not a candidate for anti-coag ablation secondary to risk of falls. 6. Thrombocytopenia - probably from infectious cause. Closely follow CBC for any further worsening. Check LDH for any hemolytic process. 7. Skin tear on the right anterior shin - wound team consult.   DVT Prophylaxis SCDs.  Code Status: Full code.  Family Communication: Patient's sister.  Disposition Plan: Admit to inpatient.    Charles Hendricks N. [facility] Pager [phone].  If 7PM-7AM, please contact night-coverage [email or web address] Password  [facility] 08/21/2014, 12:53 AM

## 2014-08-21 NOTE — Progress Notes (Signed)
This encounter was created in error - please disregard.

## 2014-08-21 NOTE — Progress Notes (Signed)
Foley was inserted because pt is unstable and has received 5L bolus since admission. Strict I/O will be monitors. There was no complication with insertions. 150cc amber urine was put out.   Caralee Ates, RN

## 2014-08-21 NOTE — Progress Notes (Signed)
EEG completed; results pending.    

## 2014-08-21 NOTE — Progress Notes (Signed)
Merdis Delay, NP, was updated throughout the night about the pt HR being in the 120's to 140's. She was notified at 6AM about the pt lactic acid of 4.3  Caralee Ates, RN

## 2014-08-21 NOTE — Evaluation (Signed)
SLP Cancellation Note  Pt unable to participate/lethargic despite max cues. Speech will follow-up tomorrow. GO     Bermudez-Bosch, Shaiann Mcmanamon 08/21/2014, 9:59 AM

## 2014-08-21 NOTE — Consult Note (Signed)
PULMONARY  / CRITICAL CARE MEDICINE  Name: Charles Hendricks MRN: [id number] DOB: [birth year]    LOS: 1  REFERRING MD :  Dr. Toniann Fail, [facility]  CHIEF COMPLAINT:  Altered mental status  BRIEF PATIENT DESCRIPTION: Charles Hendricks is an 81 yo male with PMHx of complete heart block and sick sinus syndrome s/p medtronic pacemaker, chronic atrial fibrillation, NICM, chronic systolic CHF with EF 25-30% dementia, and CKD stage III who presented to the ED via EMS after being found altered and confused at home. Patient remains minimally arousable, hypothermic, low-normal BP with critical labs of hypoglycemia, hyperkalemia and lactic acid 8.74.   LINES / TUBES: PIV  CULTURES: BCx 4/11>> UCx 4/11>>  ANTIBIOTICS: Zosyn 4/11>> Vancomycin 4/11>>  SIGNIFICANT EVENTS:  4/11>>Admitted to SDU  LEVEL OF CARE:  SDU versus ICU PRIMARY SERVICE:  [facility]:  PCCM CODE STATUS: FULL DIET:  NPO DVT Px:  Recommend DVT ppx  HISTORY OF PRESENT ILLNESS:  Charles Hendricks is an 81 yo male with PMHx of complete heart block and sick sinus syndrome s/p medtronic pacemaker, chronic atrial fibrillation, NICM, chronic systolic CHF with EF 25-30% and bilateral atrial enlargement with increased pulmonary pressures, dementia, HTN, gout and CKD stage III who presented to the ED via EMS after being found altered and confused at home, laying on his right side. Patient's family had not heard from the patient in a day and he was not answering any phone calls. EMS was called by family. Patient was hypoglycemic in the 20s. History was unable to be obtained from the patient as he is minimally arousable. Per report, he only mumbled incoherently in the ED. On arrival, patient was hypothermic at 93.9, normotensive at 117/59, HR 80, RR 24, satting well on room air. Labs showed patient was hypoglycemic, hyperkalemic at 5.3, CK 614, lactic acid 8.74. Likely AKI with BUN/Cr 71/2.39. No leukocytosis. CT head and cervical spine  unremarkable. CXR showed decreased right pleural effusion and vascular congestion, however probable slight increase in cardiomegaly. Urinalysis was negative for infection. PCCM was consulted for possible severe sepsis and lactic acidosis.   PAST MEDICAL HISTORY :  Past Medical History  Diagnosis Date  . Bradycardia   . Chronic atrial fibrillation     Sick Sinus syndrome 1998  . Complete heart block     a. Initial placement 2005. s/p Medtronic pacemaker gen change 2013.  Marland Kitchen HTN (hypertension), benign   . Pacemaker-Medtronic   . Non-ischemic cardiomyopathy     last EF 35% by Echo at [facility] 2008  . Gout   . Elevated PSA     asymptomatic  . Corns and callosities   . Cognitive impairment     a. Taken off Coumadin due to this.  . Chronic combined systolic and diastolic CHF (congestive heart failure)   . CKD (chronic kidney disease), stage III    Past Surgical History  Procedure Laterality Date  . Pacemaker insertion  1998    power source change 2005  . Permanent pacemaker generator change N/A 03/05/2012    Procedure: PERMANENT PACEMAKER GENERATOR CHANGE;  Surgeon: Marinus Maw, MD;  Location: [facility] CATH LAB;  Service: Cardiovascular;  Laterality: N/A;   Prior to Admission medications   Medication Sig Start Date End Date Taking? Authorizing Provider  furosemide (LASIX) 40 MG tablet Take 1 tablet (40 mg total) by mouth 2 (two) times daily. 1 tablet early a.m. and one tablet midafternoon. 07/19/14  Yes Rhonda G Barrett, PA-C  lisinopril (PRINIVIL,ZESTRIL) 5 MG tablet Take  1 tablet (5 mg total) by mouth daily. 07/19/14  Yes Rhonda G Barrett, PA-C  Magnesium Oxide 400 MG CAPS Take 1 capsule (400 mg total) by mouth daily. 07/19/14  Yes Rhonda G Barrett, PA-C  metoprolol succinate (TOPROL-XL) 50 MG 24 hr tablet Take 1 tablet (50 mg total) by mouth daily. Take with or immediately following a meal. 07/19/14  Yes Rhonda G Barrett, PA-C  potassium chloride SA (K-DUR,KLOR-CON) 20 MEQ tablet Take 1 tablet (20  mEq total) by mouth daily. 07/26/14  Yes Beatrice Lecher, PA-C   Allergies  Allergen Reactions  . Asa [Aspirin] Other (See Comments)    Told not to take med-per MD    FAMILY HISTORY:  Family History  Problem Relation Age of Onset  . Heart failure Mother    SOCIAL HISTORY:  reports that he has quit smoking. He does not have any smokeless tobacco history on file. He reports that he does not drink alcohol. His drug history is not on file.  REVIEW OF SYSTEMS:  Level V Caveat- patient is minimally arousable  VITAL SIGNS: Temp:  [93.9 F (34.4 C)-98.4 F (36.9 C)] 98.4 F (36.9 C) (04/11 0115) Pulse Rate:  [80-81] 81 (04/11 0115) Resp:  [23-24] 23 (04/11 0115) BP: (108-117)/(56-59) 108/56 mmHg (04/11 0115) SpO2:  [96 %] 96 % (04/11 0115) Weight:  [121 lb 0.5 oz (54.9 kg)] 121 lb 0.5 oz (54.9 kg) (04/10 2300)  INTAKE / OUTPUT: Intake/Output      04/10 0701 - 04/11 0700   I.V. (mL/kg) 3016.7 (54.9)   Total Intake(mL/kg) 3016.7 (54.9)   Net +3016.7        PHYSICAL EXAMINATION: General: Vital signs reviewed.  Patient is a thin, elderly appearing male, minimally arousable.  HEENT: Normocephalic and atraumatic. Facial edema. Conjunctivae normal, no scleral icterus. Supple, trachea midline.  Cardiovascular: Irregularly irregular. Pulmonary/Chest: Clear to auscultation bilaterally, no wheezes, rales, or rhonchi. Abdominal: Soft, non-tender, non-distended, BS + Extremities: 2+ pitting edema bilaterally, pulses symmetric and intact bilaterally.  Neurological: Mininally arousable.  Skin: 8 cm x 8 cm ulceration on right posterior inferior calf without evidence of erythema, increased warmth, exudate. Psychiatric: Unable to assess      LABS: CBC  Recent Labs Lab 08/20/14 1914  WBC 9.7  HGB 13.3  HCT 41.6  PLT 107*    Chemistry   Recent Labs Lab 08/20/14 1914 08/20/14 2305  NA 143  --   K 5.3*  --   CL 109  --   CO2 13*  --   BUN 71*  --   CREATININE 2.39*  --     CALCIUM 8.9  --   MG  --  2.1  GLUCOSE 84  --    Liver fxn  Recent Labs Lab 08/20/14 1914  AST 135*  ALT 74*  ALKPHOS 89  BILITOT 2.3*  PROT 5.6*  ALBUMIN 3.0*   coags  Recent Labs Lab 08/20/14 1914  INR 2.13*   Sepsis markers  Recent Labs Lab 08/20/14 2311  LATICACIDVEN 8.74*   Cardiac markers  Recent Labs Lab 08/20/14 1914  CKTOTAL 614*  TROPONINI 0.06*   ABG  Recent Labs Lab 08/20/14 2352  PHART 7.334*  PCO2ART 22.7*  PO2ART 67.0*  HCO3 12.3*  TCO2 13   CBG trend  Recent Labs Lab 08/20/14 1755 08/20/14 2029 08/20/14 2327  GLUCAP 89 59* 121*   IMAGING: CXR 4/10>> Decreased right pleural effusion and vascular congestion, however probable slight increase in cardiomegaly. CT Head and Cervical  Spine 4/11>> No acute intracranial abnormality. Stable chronic findings including remote left MCA distribution infarct. Degenerative change in the cervical spine without acute fracture or subluxation.  ECG: 4/10>> Atrial Fibrillation, Prolonged QT  DIAGNOSES: Principal Problem:   Acute encephalopathy Active Problems:   AKI (acute kidney injury)   Hypoglycemia   Hypothermia   Lactic acidosis   ASSESSMENT / PLAN:  PULMONARY  ASSESSMENT: Possible HCAP-Lung CTA b/l. CXR without infectious etiology as above, improving right pleural effusion and vascular congestion. Without obvious source for sepsis like picture, will cover for HCAP given recent hospitalization in March. ABG: pH 7.33, pCO2 22.7, pO2 67, bicarb 12.3 Satting well on room air PLAN:   Supplemental oxygen as needed Vancomcyin Zosyn Repeat CXR tomorrow am   CARDIOVASCULAR  ASSESSMENT:  Chronic Atrial Fibrillation- not on chronic oral anticoagulation due to frailty and problems with compliance per most recent discharge summary. On metoprolol 50 mg daily.  Chronic Systolic CHF EF 25-30%- Lungs CTA, but 2+ LEE b/l and facial edema. On lasix 40 mg BID and lisinopril 5 mg daily at  home Sick Sinus and Complete Heart Block s/p Pacemaker HTN NICM PLAN:  Repeat EKG  Troponin x 3 Lipid panel Telemetry Holding metoprolol and lisinopril secondary to low-normal BP  RENAL  ASSESSMENT:   Acute on Chronic Kidney Disease Stage III- Cr 2.39 on admission, baseline 1.7.  Proteinuria- Greater than 300 mg/dL on UA. LEE b/l and facial edema. Question nephrotic syndrome.  Hyperkalemia- 5.3 on admission. No peaked TW.  PLAN:   Repeat CMET in am Received 250 cc NS bolus NS 100 cc/hr continuous Urine protein/creatinine I/Os Holding lisinopril  GASTROINTESTINAL  ASSESSMENT:   Transaminitis- AST 135, ALT 74 (baseline normal) PLAN:   NPO Acetaminophen level UDS Salicylate level Acute hepatitis panel  Alcohol level  HEMATOLOGIC  ASSESSMENT:   INR 2.13-not on anticoagulation per recent discharge summary PLAN:  Repeat CBC/CMET in am PTT CK  INFECTIOUS  ASSESSMENT:   SIRS with hypothermia, tachypnea. Low-normal BP. No leukocytosis. No obvious source of infection as CXR unremarkable, UA without infection, skin tear C/D without evidence of infection. Possible HCAP due to recent hospital admission.  Lactic Acidosis-  874 on admission PLAN:   Vancomycin Zosyn Repeat CBC in am Procalcitonin Q48H Lactic Acid stat and Q3H  Blood Cx Urine CX Warming blanket Repeat CXR  ENDOCRINE  ASSESSMENT:   Hypoglycemia-CBG 20s on arrival of EMS, 59 upon arrival to ED, resolved to 121 after D50 TSH normal 02/14/2014 PLAN:   Check TSH HgbA1c Cortisol level CBGs Q2H  NEUROLOGIC  ASSESSMENT:   Dementia Acute Encephalopathy-minimally arousable on exam, CT head and cervical spine negative for acute abnormalities, consider seizures PLAN:   Ammonia level UDS EEG Further work up per primary Neuro checks  Carotid dopplers  CLINICAL SUMMARY: Charles Hendricks is an 81 yo male with PMHx of complete heart block and sick sinus syndrome s/p medtronic pacemaker, chronic atrial  fibrillation, NICM, chronic systolic CHF with EF 25-30% dementia, and CKD stage III who presented to the ED via EMS after being found altered and confused at home. Patient remains minimally arousable, hypothermic, low-normal BP with critical labs of hypoglycemia, hyperkalemia and lactic acid 8.74. We will cover broadly for possible sepsis and continue work up and trending lactic acid. Continue work up for acute encephalopathy although sepsis may be the cause. Case discussed with attending, patient felt to be stable to remain in SDU at this time. Will continue to follow.   Jill Alexanders, DO  PGY-1 Internal Medicine Resident Pager # [phone] 08/21/2014 3:56 AM   Attending:  I have seen and examined the patient with nurse practitioner/resident and agree with the note above.   On my exam he was awake enough to tell me that he was in a hospital, but he was clearly delirious. Lungs clear.  Skin tear on legs noted.  Overall picture most consistent with septic shock, but source is uncertain.  Will cover for cellulitis but the skin tear doesn't look too bad at this point.  Ideally will hold metoprolol for 12 hours or so while he receives IVF.  As lactic acid clears would back off on fluid and add back heart failure medications.  No family available for goals of care conversation.  May want to have cardiology see him as they knew him well from the March admission which seemed very similar to this one.  I'm not certain how aggressive we should be here as his overall prognosis seems poor with two admissions in 2 months.  Heber Hawarden, MD [place] [facility] Pager: [phone] Cell: [phone] If no response, call [phone]

## 2014-08-22 ENCOUNTER — Inpatient Hospital Stay (HOSPITAL_COMMUNITY): Payer: Medicare Other

## 2014-08-22 LAB — COMPREHENSIVE METABOLIC PANEL
ALT: 313 U/L — AB (ref 0–53)
AST: 422 U/L — AB (ref 0–37)
Albumin: 2.5 g/dL — ABNORMAL LOW (ref 3.5–5.2)
Alkaline Phosphatase: 79 U/L (ref 39–117)
Anion gap: 13 (ref 5–15)
BILIRUBIN TOTAL: 1.6 mg/dL — AB (ref 0.3–1.2)
BUN: 78 mg/dL — ABNORMAL HIGH (ref 6–23)
CHLORIDE: 114 mmol/L — AB (ref 96–112)
CO2: 18 mmol/L — ABNORMAL LOW (ref 19–32)
Calcium: 7.9 mg/dL — ABNORMAL LOW (ref 8.4–10.5)
Creatinine, Ser: 2.5 mg/dL — ABNORMAL HIGH (ref 0.50–1.35)
GFR calc non Af Amer: 21 mL/min — ABNORMAL LOW (ref >90)
GFR, EST AFRICAN AMERICAN: 25 mL/min — AB (ref >90)
Glucose, Bld: 143 mg/dL — ABNORMAL HIGH (ref 70–99)
Potassium: 4.5 mmol/L (ref 3.5–5.1)
SODIUM: 145 mmol/L (ref 135–145)
Total Protein: 5.1 g/dL — ABNORMAL LOW (ref 6.0–8.3)

## 2014-08-22 LAB — GLUCOSE, CAPILLARY
GLUCOSE-CAPILLARY: 118 mg/dL — AB (ref 70–99)
GLUCOSE-CAPILLARY: 63 mg/dL — AB (ref 70–99)
GLUCOSE-CAPILLARY: 96 mg/dL (ref 70–99)
GLUCOSE-CAPILLARY: 59 mg/dL — AB (ref 70–99)
GLUCOSE-CAPILLARY: 115 mg/dL — AB (ref 70–99)
GLUCOSE-CAPILLARY: 123 mg/dL — AB (ref 70–99)
Glucose-Capillary: 98 mg/dL (ref 70–99)
Glucose-Capillary: 122 mg/dL — ABNORMAL HIGH (ref 70–99)
Glucose-Capillary: 60 mg/dL — ABNORMAL LOW (ref 70–99)

## 2014-08-22 LAB — CBC
HCT: 36.9 % — ABNORMAL LOW (ref 39.0–52.0)
HEMOGLOBIN: 12.2 g/dL — AB (ref 13.0–17.0)
MCH: 30.4 pg (ref 26.0–34.0)
MCHC: 33.1 g/dL (ref 30.0–36.0)
MCV: 92 fL (ref 78.0–100.0)
Platelets: 78 10*3/uL — ABNORMAL LOW (ref 150–400)
RBC: 4.01 MIL/uL — ABNORMAL LOW (ref 4.22–5.81)
RDW: 16.2 % — ABNORMAL HIGH (ref 11.5–15.5)
WBC: 10.2 10*3/uL (ref 4.0–10.5)

## 2014-08-22 LAB — URINE CULTURE
Colony Count: NO GROWTH
Culture: NO GROWTH

## 2014-08-22 LAB — HEMOGLOBIN A1C
HEMOGLOBIN A1C: 6.5 % — AB (ref 4.8–5.6)
MEAN PLASMA GLUCOSE: 140 mg/dL

## 2014-08-22 LAB — TROPONIN I: Troponin I: 0.17 ng/mL — ABNORMAL HIGH (ref <0.031)

## 2014-08-22 MED ORDER — SODIUM CHLORIDE 0.9 % IJ SOLN
10.0000 mL | INTRAMUSCULAR | Status: DC | PRN
  Administered 2014-08-23 – 2014-08-27 (×4): 10 mL
  Administered 2014-08-27: 20 mL
  Administered 2014-08-27 – 2014-08-30 (×6): 10 mL
  Filled 2014-08-22 (×11): qty 40

## 2014-08-22 MED ORDER — SODIUM CHLORIDE 0.9 % IV BOLUS (SEPSIS)
500.0000 mL | Freq: Once | INTRAVENOUS | Status: AC
  Administered 2014-08-22: 500 mL via INTRAVENOUS

## 2014-08-22 MED ORDER — DEXTROSE 10 % IV SOLN
INTRAVENOUS | Status: DC
Start: 2014-08-22 — End: 2014-08-23
  Administered 2014-08-22: 21:00:00 via INTRAVENOUS

## 2014-08-22 MED ORDER — DEXTROSE 50 % IV SOLN
INTRAVENOUS | Status: AC
  Filled 2014-08-22: qty 50

## 2014-08-22 MED ORDER — METOPROLOL TARTRATE 1 MG/ML IV SOLN
2.5000 mg | Freq: Two times a day (BID) | INTRAVENOUS | Status: DC
  Administered 2014-08-22 – 2014-08-26 (×7): 2.5 mg via INTRAVENOUS
  Filled 2014-08-22 (×11): qty 5

## 2014-08-22 MED ORDER — DEXTROSE 50 % IV SOLN
25.0000 mL | Freq: Once | INTRAVENOUS | Status: AC
  Administered 2014-08-22: 25 mL via INTRAVENOUS
  Filled 2014-08-22: qty 50

## 2014-08-22 MED ORDER — SODIUM CHLORIDE 0.9 % IJ SOLN
10.0000 mL | Freq: Two times a day (BID) | INTRAMUSCULAR | Status: DC
  Administered 2014-08-22 – 2014-08-24 (×4): 10 mL
  Administered 2014-08-25: 20 mL
  Administered 2014-08-27 – 2014-08-30 (×2): 10 mL

## 2014-08-22 NOTE — Progress Notes (Signed)
[facility] PCCM  S: Mental status improving, more awake, Cr stable, BP stable O: Filed Vitals:   08/22/14 0500 08/22/14 0600 08/22/14 0700 08/22/14 0748  BP: 134/73 154/92 98/56 108/74  Pulse:  25  84  Temp:    97.3 F (36.3 C)  TempSrc:    Axillary  Resp: 19 24 23 22   Height:      Weight:      SpO2:  59%      Gen: awake, asking questions HEENT: NCAT EOM PULM: CTA B CV: RRR, nl S1/S2 AB: BS+, soft, nontender Ext: warm, trace edema Derm: skin tear dressed Neuro: Awake, follows commands  Impression/Rec 1) lactic acidosis> lactic acid cleared yesterday, source uncertain as never frankly septic as no clear source of infection.  If lactic acid has not cleared would consider abdominal imaging to look for bowel ischemia but considering advanced age its not clear to me that he would be amenable to an invasive intervention.  Remember that lactic acid can take longer to clear in setting of renal failure.  Would continue gentle fluids and restart CHF medications tomorrow (lisinopril, lasix).   2) CHF> does not appear frankly volume overloaded on exam today.  As above  PCCM will sign off as appears stable.  Call us back if questions or if his condition changes  Heber Epworth, MD [facility] Pager: [phone] Cell: [phone] If no response, call [phone]

## 2014-08-22 NOTE — Evaluation (Signed)
Clinical/Bedside Swallow Evaluation Patient Details  Name: Charles Hendricks MRN: [id number] Date of Birth: [birth year]  Today's Date: 08/22/2014 Time: SLP Start Time (ACUTE ONLY): 0850 SLP Stop Time (ACUTE ONLY): 0900 SLP Time Calculation (min) (ACUTE ONLY): 10 min  Past Medical History:  Past Medical History  Diagnosis Date  . Bradycardia   . Chronic atrial fibrillation     Sick Sinus syndrome 1998  . Complete heart block     a. Initial placement 2005. s/p Medtronic pacemaker gen change 2013.  Marland Kitchen HTN (hypertension), benign   . Pacemaker-Medtronic   . Non-ischemic cardiomyopathy     last EF 35% by Echo at [facility] 2008  . Gout   . Elevated PSA     asymptomatic  . Corns and callosities   . Cognitive impairment     a. Taken off Coumadin due to this.  . Chronic combined systolic and diastolic CHF (congestive heart failure)   . CKD (chronic kidney disease), stage III    Past Surgical History:  Past Surgical History  Procedure Laterality Date  . Pacemaker insertion  1998    power source change 2005  . Permanent pacemaker generator change N/A 03/05/2012    Procedure: PERMANENT PACEMAKER GENERATOR CHANGE;  Surgeon: Marinus Maw, MD;  Location: [facility] CATH LAB;  Service: Cardiovascular;  Laterality: N/A;   HPI:  Pt is an 81 y/o male with PMH of nonischemic cardiomyopathy (prior admission 2015 35%), chronic atrial fibrillation, complete heartblock s/p pacemaker placement, HTN, CKD. Pt was found at home on the floor confused, hypothermic, hypoglycemic and hypotensive. In ER pt unable to follow commands; confused.  CXR revealed small R pleural effusion. CT revealed no acute intracranial abnormality. Stable chronic findings including remote left MCA distribution infarct.    Assessment / Plan / Recommendation Clinical Impression   Pt was seen for swallowing evaluation. Pt opened his eyes intermittently, however, unable to sustain attention for PO intake despite max, verbal and tactile cues from  SLP. SLP attempted ice chips x2; no overt s/s of aspiration, however, pt with poor attention to bolus and suspect delayed swallow. Pt held puree in mouth; SLP removed puree with suction. Recommend pt continue NPO status until mentation improves. Recommend alternative means of nutrition if pt's mentation does not improve. Speech will follow-up with continued PO trials to determine pt readiness for oral intake.     Aspiration Risk  Moderate    Diet Recommendation NPO   Medication Administration: Via alternative means Supervision: Full supervision/cueing for compensatory strategies Postural Changes and/or Swallow Maneuvers: Seated upright 90 degrees    Other  Recommendations Oral Care Recommendations: Oral care Q4 per protocol   Follow Up Recommendations  Skilled Nursing facility    Frequency and Duration min 2x/week  2 weeks   Pertinent Vitals/Pain     SLP Swallow Goals     Swallow Study Prior Functional Status       General HPI: Pt is an 81 y/o male with PMH of nonischemic cardiomyopathy (prior admission 2015 35%), chronic atrial fibrillation, complete heartblock s/p pacemaker placement, HTN, CKD. Pt was found at home on the floor confused, hypothermic, hypoglycemic and hypotensive. In ER pt unable to follow commands; confused.  CXR revealed small R pleural effusion. CT revealed no acute intracranial abnormality. Stable chronic findings including remote left MCA distribution infarct.  Type of Study: Bedside swallow evaluation Diet Prior to this Study: NPO Temperature Spikes Noted: No Respiratory Status: Nasal cannula History of Recent Intubation: No Behavior/Cognition: Requires cueing;Doesn't follow directions;Decreased  sustained attention;Lethargic Oral Cavity - Dentition: Edentulous Self-Feeding Abilities: Total assist Patient Positioning: Upright in bed Baseline Vocal Quality: Other (comment) (unable to elicit) Volitional Cough: Cognitively unable to elicit Volitional  Swallow: Unable to elicit    Oral/Motor/Sensory Function Overall Oral Motor/Sensory Function: Other (comment) Labial ROM:  (Unable to assess.)   Ice Chips Ice chips: Impaired Presentation: Cup Oral Phase Impairments: Poor awareness of bolus   Thin Liquid Thin Liquid: Not tested    Nectar Thick Nectar Thick Liquid: Not tested   Honey Thick Honey Thick Liquid: Not tested   Puree Puree: Impaired Presentation: Spoon Oral Phase Impairments: Poor awareness of bolus;Other (comment) (Oral holding) Oral Phase Functional Implications: Oral holding   Solid   GO    Solid: Not tested       Hendricks, Charles Chappuis 08/22/2014,9:26 AM

## 2014-08-22 NOTE — Evaluation (Signed)
Physical Therapy Evaluation Patient Details Name: Charles Hendricks MRN: [id number] DOB: 12/16/1924 Today's Date: 08/22/2014   History of Present Illness  Pt is an 81 y/o male with PMH of nonischemic cardiomyopathy (prior admission 2015 35%), chronic atrial fibrillation, complete heartblock s/p pacemaker placement, HTN, CKD. Pt was found at home on the floor confused, hypothermic, hypoglycemic and hypotensive. In ER pt unable to follow commands; confused. CXR revealed small R pleural effusion. CT revealed no acute intracranial abnormality. Stable chronic findings including remote left MCA distribution infarct.    Clinical Impression  Pt admitted with above diagnosis. Pt currently with functional limitations due to the deficits listed below (see PT Problem List). Pt will need NHP/24 hour care at d/c with therapy.  Currently max to total assit for mobility overall with lethargy affecting pts ability to participate.  Will follow acutely.   Pt will benefit from skilled PT to increase their independence and safety with mobility to allow discharge to the venue listed below.      Follow Up Recommendations SNF;Supervision/Assistance - 24 hour    Equipment Recommendations  Other (comment) (TBA)    Recommendations for Other Services       Precautions / Restrictions Precautions Precautions: Fall Restrictions Weight Bearing Restrictions: No      Mobility  Bed Mobility Overal bed mobility: Needs Assistance;+2 for physical assistance Bed Mobility: Supine to Sit     Supine to sit: Max assist;+2 for physical assistance     General bed mobility comments: Pt gave little effort to get to sitting.   Transfers Overall transfer level: Needs assistance Equipment used: 2 person hand held assist Transfers: Sit to/from [employer] Sit to Stand: Mod assist;+2 physical assistance;From elevated surface   Squat pivot transfers: Mod assist;From elevated surface;+2 physical assistance      General transfer comment: Pt initiated sit to stand to command but gave little follow through effort.  Needed incr assist and blocking of bil knees to perform squat pivot.    Ambulation/Gait                Stairs            Wheelchair Mobility    Modified Rankin (Stroke Patients Only) Modified Rankin (Stroke Patients Only) Pre-Morbid Rankin Score: Moderate disability Modified Rankin: Severe disability     Balance Overall balance assessment: Needs assistance;History of Falls Sitting-balance support: Bilateral upper extremity supported;Feet supported Sitting balance-Leahy Scale: Zero Sitting balance - Comments: Pt needed mod to max assist to sit for 8 min EOB.  Pt did have periods where he was min guard for seconds only and mostly mod to max assst to sit.   Postural control: Posterior lean Standing balance support: Bilateral upper extremity supported;During functional activity Standing balance-Leahy Scale: Zero Standing balance comment: Could not stand without +2 assist with very little efffort given                             Pertinent Vitals/Pain Pain Assessment: No/denies pain  VSS on 2LO2    Home Living Family/patient expects to be discharged to:: Private residence Living Arrangements: Alone Available Help at Discharge: Family;Available PRN/intermittently Type of Home: House         Home Equipment: Gilmer Mor - single point Additional Comments: All info was from previous stay    Prior Function Level of Independence: Independent with assistive device(s)         Comments: Today, pt non verbal  Hand Dominance        Extremity/Trunk Assessment   Upper Extremity Assessment: Defer to OT evaluation           Lower Extremity Assessment: RLE deficits/detail;LLE deficits/detail RLE Deficits / Details: appears grossly 2-/5 LLE Deficits / Details: appears grossly 2-/5  Cervical / Trunk Assessment: Kyphotic  Communication    Communication: HOH  Cognition Arousal/Alertness: Lethargic Behavior During Therapy: Flat affect Overall Cognitive Status: Difficult to assess Area of Impairment: Safety/judgement;Problem solving;Following commands       Following Commands: Follows one step commands inconsistently;Follows one step commands with increased time     Problem Solving: Difficulty sequencing;Slow processing;Decreased initiation;Requires verbal cues;Requires tactile cues General Comments: Pt level of arousal varying with pt responding at times and not responding at other times.      General Comments      Exercises        Assessment/Plan    PT Assessment Patient needs continued PT services  PT Diagnosis Generalized weakness   PT Problem List Decreased activity tolerance;Decreased strength;Decreased mobility;Decreased balance;Decreased knowledge of use of DME;Decreased safety awareness;Decreased cognition;Decreased knowledge of precautions  PT Treatment Interventions DME instruction;Gait training;Functional mobility training;Therapeutic activities;Therapeutic exercise;Balance training;Patient/family education   PT Goals (Current goals can be found in the Care Plan section) Acute Rehab PT Goals Patient Stated Goal: unable to state PT Goal Formulation: Patient unable to participate in goal setting Time For Goal Achievement: 09/05/14 Potential to Achieve Goals: Fair    Frequency Min 2X/week   Barriers to discharge Decreased caregiver support      Co-evaluation               End of Session Equipment Utilized During Treatment: Gait belt;Oxygen Activity Tolerance: Patient limited by fatigue;Patient limited by lethargy Patient left: in chair;with call bell/phone within reach;with chair alarm set Nurse Communication: Mobility status;Need for lift equipment         Time: 1610-9604 PT Time Calculation (min) (ACUTE ONLY): 23 min   Charges:   PT Evaluation $Initial PT Evaluation Tier I: 1  Procedure PT Treatments $Therapeutic Activity: 8-22 mins   PT G CodesBerline Lopes 04-Sep-2014, 12:15 PM  Noah Pelaez,PT Acute Rehabilitation [phone] (pager)

## 2014-08-22 NOTE — Progress Notes (Signed)
OT Cancellation Note  Patient Details Name: Charles Hendricks MRN: [id number] DOB: 03-Mar-1925   Cancelled Treatment:    Reason Eval/Treat Not Completed: Patient at procedure or test/ unavailable - Pt with another provider.  Will reattempt.   Angelene Giovanni [place], OTR/L [phone]  08/22/2014, 2:33 PM

## 2014-08-22 NOTE — Progress Notes (Signed)
[facility] PROGRESS NOTE  Charles Hendricks WUX:[id number] DOB: 12/31/1924 DOA: 08/20/2014 PCP: Pearla Dubonnet, MD  Charles Hendricks is a 81 y.o. male with history of nonischemic cardiomyopathy last year admission was 35%, chronic atrial fibrillation and complete heart block status post pacemaker placement and was felt to be a not a candidate for anticoagulation secondary to falls, hypertension, chronic kidney disease was brought to the ER after patient was found to be on the floor confused. Patient was brought to the ER and CT head and C-spine was unremarkable. Patient was found to be hypothermic (93), hypoglycemic and hypotensive. Lactic acid was around 8. Admitted 4/11 early am PCCM was consulted. Mentation improving, being treated for sepsis, unclear source and Hypoglycemia  Assessment/Plan: 1. Encephalopathy   -etiology unclear, suspect Sepsis/hypoglycemia as possible causes -mentation starting to improve, speech following, hopefully start diet pending speech eval - continue IV VAnc/cefepime, FU blood Cx - IVF-changed to , lactic acid improving -PCCM consulting -TSh and Cortisol WNL -CT head unremarkable -unable to have MRI due to pacer -EEG without epileptiform activity  2. Elevated troponin with history of nonischemic cardiomyopathy EF of 35%   -likely due to sepsis, non ACS pattern, and poor renal clearance due to CKD   -allergic to  ASA, continue low dose BB, 2D ECHO pending  3. Acute on chronic kidney disease stage III  - due to sepsis, hypotension  - continue IVF and hold antihypertensives   - baseline creatinine 1.5-2  -monitor urine output, bmet in am  4. Hypoglycemia -etiology suspected to be from sepsis -start D10 gtt today -Continue Abx -also check RUQ [place] to eval for liver pathology -suspect shock liver also contributing to hypoglycemia with low glycogen stores   5.  History of chronic atrial fibrillation and complete heart block status post pacemaker  placement - patient was not felt to be a candidate for anti-coagulation secondary to risk of falls, cognitive impairment -followed by cards as outpatient  6.  Thrombocytopenia     -likely due to sepsis  7. Elevated LFTs -likely due to shock liver, monitor -was hypotensive on admission, will also check RUQ [place]  8. Skin tear on the right anterior shin - wound team consult.  9. Dementia/cognitive impairment   GLOBAL: if doesn't turn around in 1-2days will need Palliative eval  Code Status: Full Code Family Communication:none at bedside, called and d/w sister and grand-daughter 4/11 Disposition Plan: Keep in SDU   Consultants:  PCCM  Antibiotics:  Vanc/Zosyn  HPI/Subjective: More clearer, opens eyes and answers few questions  Objective: Filed Vitals:   08/22/14 1204  BP: 145/73  Pulse:   Temp: 97.6 F (36.4 C)  Resp: 19    Intake/Output Summary (Last 24 hours) at 08/22/14 1231 Last data filed at 08/22/14 1000  Gross per 24 hour  Intake 2348.75 ml  Output    551 ml  Net 1797.75 ml   Filed Weights   08/20/14 2300 08/21/14 0115  Weight: 54.9 kg (121 lb 0.5 oz) 63.1 kg (139 lb 1.8 oz)    Exam:   General:  More lucid, opens eyes, answers few simple questions  Cardiovascular: S1S2/RRR  Respiratory: diminished BS at bases  Abdomen: soft, BT, Bs present  Musculoskeletal: no edema c/c  Neuro: exam limited due to mentation, moves all extremities, no localizing signs  Data Reviewed: Basic Metabolic Panel:  Recent Labs Lab 08/20/14 1914 08/20/14 2305 08/21/14 0431 08/22/14 0411  NA 143  --  142 145  K 5.3*  --  5.2* 4.5  CL 109  --  112 114*  CO2 13*  --  21 18*  GLUCOSE 84  --  135* 143*  BUN 71*  --  74* 78*  CREATININE 2.39*  --  2.50* 2.50*  CALCIUM 8.9  --  8.0* 7.9*  MG  --  2.1  --   --    Liver Function Tests:  Recent Labs Lab 08/20/14 1914 08/21/14 0431 08/22/14 0411  AST 135* 475* 422*  ALT 74* 234* 313*  ALKPHOS 89 79 79   BILITOT 2.3* 1.9* 1.6*  PROT 5.6* 5.4* 5.1*  ALBUMIN 3.0* 2.6* 2.5*   No results for input(s): LIPASE, AMYLASE in the last 168 hours.  Recent Labs Lab 08/21/14 0431  AMMONIA 29   CBC:  Recent Labs Lab 08/20/14 1914 08/21/14 0431 08/22/14 0411  WBC 9.7 9.7 10.2  NEUTROABS  --  8.6*  --   HGB 13.3 11.7* 12.2*  HCT 41.6 35.3* 36.9*  MCV 96.5 93.4 92.0  PLT 107* 98* 78*   Cardiac Enzymes:  Recent Labs Lab 08/20/14 1914 08/21/14 0431 08/21/14 1214 08/21/14 2333  CKTOTAL 614*  --  1486*  --   TROPONINI 0.06* 0.10* 0.15* 0.17*   BNP (last 3 results)  Recent Labs  07/15/14 1531  BNP 1049.7*    ProBNP (last 3 results)  Recent Labs  10/22/13 1600  PROBNP 3408.0*    CBG:  Recent Labs Lab 08/22/14 0151 08/22/14 0503 08/22/14 0746 08/22/14 1019 08/22/14 1203  GLUCAP 118* 122* 63* 96 59*    Recent Results (from the past 240 hour(s))  Urine culture     Status: None   Collection Time: 08/21/14 12:39 AM  Result Value Ref Range Status   Specimen Description URINE, CATHETERIZED  Final   Special Requests NONE  Final   Colony Count NO GROWTH Performed at [facility]   Final   Culture NO GROWTH Performed at [facility]   Final   Report Status 08/22/2014 FINAL  Final  MRSA PCR Screening     Status: None   Collection Time: 08/21/14  1:13 AM  Result Value Ref Range Status   MRSA by PCR NEGATIVE NEGATIVE Final    Comment:        The GeneXpert MRSA Assay (FDA approved for NASAL specimens only), is one component of a comprehensive MRSA colonization surveillance program. It is not intended to diagnose MRSA infection nor to guide or monitor treatment for MRSA infections.   Blood culture (routine x 2)     Status: None (Preliminary result)   Collection Time: 08/21/14  2:20 AM  Result Value Ref Range Status   Specimen Description BLOOD LEFT ARM  Final   Special Requests BOTTLES DRAWN AEROBIC ONLY 1CC  Final   Culture   Final            BLOOD CULTURE RECEIVED NO GROWTH TO DATE CULTURE WILL BE HELD FOR 5 DAYS BEFORE ISSUING A FINAL NEGATIVE REPORT Performed at [facility]    Report Status PENDING  Incomplete     Studies: Dg Chest 2 View  08/20/2014   CLINICAL DATA:  Found on floor in bathroom, lower extremity edema.  EXAM: CHEST  2 VIEW  COMPARISON:  07/15/2014  FINDINGS: Single lead left-sided pacemaker in place. The heart is enlarged, likely progressed from prior exam. Small right pleural effusion, slightly decreased in degree from prior exam. Probable tiny left pleural effusion. Decreased vascular congestion. No pneumothorax. No confluent  airspace disease.  IMPRESSION: Decreased right pleural effusion and vascular congestion, however probable slight increase in cardiomegaly.   Electronically Signed   By: Rubye Oaks M.D.   On: 08/20/2014 23:21   Ct Head Wo Contrast  08/20/2014   CLINICAL DATA:  81 year old male with confusion after fall.  EXAM: CT HEAD WITHOUT CONTRAST  CT CERVICAL SPINE WITHOUT CONTRAST  TECHNIQUE: Multidetector CT imaging of the head and cervical spine was performed following the standard protocol without intravenous contrast. Multiplanar CT image reconstructions of the cervical spine were also generated.  COMPARISON:  Head CT 11/09/2013  FINDINGS: CT HEAD FINDINGS  Patient had difficulty tolerating the exam, moderate patient motion artifact. No intracranial hemorrhage, mass effect, or midline shift. No hydrocephalus. The basilar cisterns are patent. No evidence of acute infarct. 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 Unchanged atrophy and chronic small vessel ischemic change. No intracranial fluid collection. Atherosclerosis noted of the skullbase vasculature. Calvarium is intact. There is mild diffuse paranasal sinus mucosal thickening. The mastoid air cells are well aerated.  CT CERVICAL SPINE FINDINGS  Cervical spine alignment is maintained. Vertebral body heights are preserved.  There is no fracture. The dens is intact. There are no jumped or perched facets. There is multilevel degenerative disc disease most significant at C6-C7. Multilevel facet arthropathy. No prevertebral soft tissue edema.  IMPRESSION: 1. No acute intracranial abnormality. Stable chronic findings including remote left MCA distribution infarct. 2. Degenerative change in the cervical spine without acute fracture or subluxation.   Electronically Signed   By: Rubye Oaks M.D.   On: 08/20/2014 22:11   Ct Cervical Spine Wo Contrast  08/20/2014   CLINICAL DATA:  81 year old male with confusion after fall.  EXAM: CT HEAD WITHOUT CONTRAST  CT CERVICAL SPINE WITHOUT CONTRAST  TECHNIQUE: Multidetector CT imaging of the head and cervical spine was performed following the standard protocol without intravenous contrast. Multiplanar CT image reconstructions of the cervical spine were also generated.  COMPARISON:  Head CT 11/09/2013  FINDINGS: CT HEAD FINDINGS  Patient had difficulty tolerating the exam, moderate patient motion artifact. No intracranial hemorrhage, mass effect, or midline shift. No hydrocephalus. The basilar cisterns are patent. No evidence of acute infarct. 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 Unchanged atrophy and chronic small vessel ischemic change. No intracranial fluid collection. Atherosclerosis noted of the skullbase vasculature. Calvarium is intact. There is mild diffuse paranasal sinus mucosal thickening. The mastoid air cells are well aerated.  CT CERVICAL SPINE FINDINGS  Cervical spine alignment is maintained. Vertebral body heights are preserved. There is no fracture. The dens is intact. There are no jumped or perched facets. There is multilevel degenerative disc disease most significant at C6-C7. Multilevel facet arthropathy. No prevertebral soft tissue edema.  IMPRESSION: 1. No acute intracranial abnormality. Stable chronic findings including remote left MCA distribution  infarct. 2. Degenerative change in the cervical spine without acute fracture or subluxation.   Electronically Signed   By: Rubye Oaks M.D.   On: 08/20/2014 22:11   Dg Chest Port 1 View  08/21/2014   CLINICAL DATA:  Pneumonia and shortness of breath  EXAM: PORTABLE CHEST - 1 VIEW  COMPARISON:  08/20/2014  FINDINGS: Cardiopericardial enlargement is unchanged from yesterday. Stable aortic and hilar contours. A single chamber pacer lead from the left is in stable position when accounting for differences in technique. Stable small right pleural effusion with underlying interstitial coarsening. Pleural thickening along the lateral right chest wall is also  unchanged. No definitive pneumonia. No edema or pneumothorax.  IMPRESSION: Stable small right pleural effusion.   Electronically Signed   By: Marnee Spring M.D.   On: 08/21/2014 06:48    Scheduled Meds: . antiseptic oral rinse  7 mL Mouth Rinse q12n4p  . chlorhexidine  15 mL Mouth Rinse BID  . dextrose  25 mL Intravenous Once  . dextrose      . metoprolol tartrate  12.5 mg Oral BID  . piperacillin-tazobactam (ZOSYN)  IV  2.25 g Intravenous Q8H  . vancomycin  500 mg Intravenous Q24H   Continuous Infusions: . dextrose     Antibiotics Given (last 72 hours)    Date/Time Action Medication Dose Rate   08/21/14 0445 Given   vancomycin (VANCOCIN) IVPB 1000 mg/200 mL premix 1,000 mg 200 mL/hr   08/21/14 0447 Given   piperacillin-tazobactam (ZOSYN) IVPB 2.25 g 2.25 g 100 mL/hr   08/21/14 1148 Given   piperacillin-tazobactam (ZOSYN) IVPB 2.25 g 2.25 g 100 mL/hr   08/21/14 2033 Given   piperacillin-tazobactam (ZOSYN) IVPB 2.25 g 2.25 g 100 mL/hr   08/22/14 0541 Given   piperacillin-tazobactam (ZOSYN) IVPB 2.25 g 2.25 g 100 mL/hr   08/22/14 0541 Given   vancomycin (VANCOCIN) 500 mg in sodium chloride 0.9 % 100 mL IVPB 500 mg 100 mL/hr      Principal Problem:   Acute encephalopathy Active Problems:   AKI (acute kidney injury)    Hypoglycemia   Hypothermia   Lactic acidosis    Time spent:    [facility]  [facility] Pager [phone]. If 7PM-7AM, please contact night-coverage at [email or web address], password [facility] 08/22/2014, 12:31 PM  LOS: 2 days

## 2014-08-22 NOTE — Progress Notes (Signed)
Peripherally Inserted Central Catheter/Midline Placement  The IV Nurse has discussed with the patient and/or persons authorized to consent for the patient, the purpose of this procedure and the potential benefits and risks involved with this procedure.  The benefits include less needle sticks, lab draws from the catheter and patient may be discharged home with the catheter.  Risks include, but not limited to, infection, bleeding, blood clot (thrombus formation), and puncture of an artery; nerve damage and irregular heat beat.  Alternatives to this procedure were also discussed.  PICC/Midline Placement Documentation    Information given to grandaughter  Gwenith Daily [facility] 08/22/2014, 6:53 PM

## 2014-08-22 NOTE — Progress Notes (Signed)
MD informed about foley bleeding since last night, MD does not want to discontinue till the bleeding clears out.Will maintain foley for other urological reason.

## 2014-08-22 NOTE — Progress Notes (Signed)
VASCULAR LAB PRELIMINARY  PRELIMINARY  PRELIMINARY  PRELIMINARY  Carotid duplex  completed.    Preliminary report:  Bilateral:  1-39% ICA stenosis.  Vertebral artery flow is antegrade.      Jaken Fregia, RVT 08/22/2014, 3:18 PM

## 2014-08-23 ENCOUNTER — Inpatient Hospital Stay (HOSPITAL_COMMUNITY): Payer: Medicare Other

## 2014-08-23 DIAGNOSIS — T68XXXD Hypothermia, subsequent encounter: Secondary | ICD-10-CM

## 2014-08-23 DIAGNOSIS — N1832 Chronic kidney disease, stage 3b: Secondary | ICD-10-CM | POA: Diagnosis present

## 2014-08-23 DIAGNOSIS — N183 Chronic kidney disease, stage 3 (moderate): Secondary | ICD-10-CM

## 2014-08-23 DIAGNOSIS — I5022 Chronic systolic (congestive) heart failure: Secondary | ICD-10-CM | POA: Diagnosis present

## 2014-08-23 LAB — COMPREHENSIVE METABOLIC PANEL
ALT: 310 U/L — ABNORMAL HIGH (ref 0–53)
ANION GAP: 11 (ref 5–15)
AST: 320 U/L — AB (ref 0–37)
Albumin: 2.4 g/dL — ABNORMAL LOW (ref 3.5–5.2)
Alkaline Phosphatase: 79 U/L (ref 39–117)
BILIRUBIN TOTAL: 2.2 mg/dL — AB (ref 0.3–1.2)
BUN: 69 mg/dL — AB (ref 6–23)
CHLORIDE: 115 mmol/L — AB (ref 96–112)
CO2: 20 mmol/L (ref 19–32)
CREATININE: 2.22 mg/dL — AB (ref 0.50–1.35)
Calcium: 8.2 mg/dL — ABNORMAL LOW (ref 8.4–10.5)
GFR calc Af Amer: 29 mL/min — ABNORMAL LOW (ref >90)
GFR calc non Af Amer: 25 mL/min — ABNORMAL LOW (ref >90)
Glucose, Bld: 166 mg/dL — ABNORMAL HIGH (ref 70–99)
Potassium: 3.4 mmol/L — ABNORMAL LOW (ref 3.5–5.1)
Sodium: 146 mmol/L — ABNORMAL HIGH (ref 135–145)
Total Protein: 5.3 g/dL — ABNORMAL LOW (ref 6.0–8.3)

## 2014-08-23 LAB — GLUCOSE, CAPILLARY
GLUCOSE-CAPILLARY: 113 mg/dL — AB (ref 70–99)
GLUCOSE-CAPILLARY: 129 mg/dL — AB (ref 70–99)
GLUCOSE-CAPILLARY: 52 mg/dL — AB (ref 70–99)
Glucose-Capillary: 95 mg/dL (ref 70–99)
Glucose-Capillary: 69 mg/dL — ABNORMAL LOW (ref 70–99)
Glucose-Capillary: 65 mg/dL — ABNORMAL LOW (ref 70–99)
Glucose-Capillary: 103 mg/dL — ABNORMAL HIGH (ref 70–99)
Glucose-Capillary: 127 mg/dL — ABNORMAL HIGH (ref 70–99)
Glucose-Capillary: 48 mg/dL — ABNORMAL LOW (ref 70–99)
Glucose-Capillary: 117 mg/dL — ABNORMAL HIGH (ref 70–99)
Glucose-Capillary: 114 mg/dL — ABNORMAL HIGH (ref 70–99)
Glucose-Capillary: 88 mg/dL (ref 70–99)

## 2014-08-23 LAB — CBC
HCT: 36.5 % — ABNORMAL LOW (ref 39.0–52.0)
Hemoglobin: 12 g/dL — ABNORMAL LOW (ref 13.0–17.0)
MCH: 30.2 pg (ref 26.0–34.0)
MCHC: 32.9 g/dL (ref 30.0–36.0)
MCV: 91.9 fL (ref 78.0–100.0)
PLATELETS: 68 10*3/uL — AB (ref 150–400)
RBC: 3.97 MIL/uL — AB (ref 4.22–5.81)
RDW: 16.4 % — AB (ref 11.5–15.5)
WBC: 8.9 10*3/uL (ref 4.0–10.5)

## 2014-08-23 LAB — PROCALCITONIN: PROCALCITONIN: 1.35 ng/mL

## 2014-08-23 LAB — GLUCOSE, RANDOM: Glucose, Bld: 161 mg/dL — ABNORMAL HIGH (ref 70–99)

## 2014-08-23 MED ORDER — LORAZEPAM 2 MG/ML IJ SOLN
0.5000 mg | Freq: Once | INTRAMUSCULAR | Status: AC
  Administered 2014-08-23: 0.5 mg via INTRAVENOUS
  Filled 2014-08-23: qty 1

## 2014-08-23 MED ORDER — FUROSEMIDE 10 MG/ML IJ SOLN
20.0000 mg | Freq: Two times a day (BID) | INTRAMUSCULAR | Status: DC
  Administered 2014-08-23 – 2014-08-24 (×2): 20 mg via INTRAVENOUS
  Filled 2014-08-23 (×4): qty 2

## 2014-08-23 MED ORDER — ALBUTEROL SULFATE (2.5 MG/3ML) 0.083% IN NEBU
2.5000 mg | INHALATION_SOLUTION | RESPIRATORY_TRACT | Status: DC | PRN
  Administered 2014-08-23 – 2014-08-24 (×3): 2.5 mg via RESPIRATORY_TRACT
  Filled 2014-08-23 (×3): qty 3

## 2014-08-23 NOTE — Progress Notes (Signed)
Gave report to Nurse Bonita Quin on 5 west and pt is going to room 26. Pt is still confused but oriented to self. Nurse assistant will take pt in bed. Pt is going on telemetry.

## 2014-08-23 NOTE — Clinical Social Work Note (Signed)
CSW spoke with pt sister who stated that the patients HCPOA is Arther Abbott.  Pt sister was unable to find the HCPOA number at the time- will contact CSW with number when she locates it.  CSW will continue to follow.  Merlyn Lot, LCSWA Clinical Social Worker [phone]

## 2014-08-23 NOTE — Progress Notes (Signed)
Report received from Dara for patient to be transferred into 5w26

## 2014-08-23 NOTE — Progress Notes (Signed)
Utilization Review Completed.  

## 2014-08-23 NOTE — Progress Notes (Signed)
Pt in 2c07 is very wheezy at this time. Called and notified nurse on floor 5 west. Called and notified via pager Dr. Rito Ehrlich.

## 2014-08-23 NOTE — Progress Notes (Signed)
PROGRESS NOTE  Charles Hendricks ZOX:[id number] DOB: 06-17-1924 DOA: 08/20/2014 PCP: Pearla Dubonnet, MD  HPI/Recap of past 24 hours: Patient is an 81 year old male past history nonischemic cardiomyopathy and ejection fraction of 35%, chronic atrial fibrillation status post pacemaker after complete heart block as well as chronic kidney disease who was admitted on 4/10 after being found on the floor confused. Initial CT scan of head unremarkable. Patient noted to be hypothermic, hypoglycemic and hypotensive with elevated lactic acid levels.  Patient's cause of encephalopathy has been unclear. Unable to get MRI due to pacemaker. Repeat CT scan done also unrevealing. Patient waxes and wanes with his mentation. He has not been fully alert enough to start a diet. Critical care consulted. Source of questionable infection not found. Patient has been hydrated.   Assessment/Plan: Principal Problem:   Acute encephalopathy: Patient still waxes and wanes. Mentation is still not strong enough for diet. Unclear underlying etiology. Repeat CT scan tomorrow.  EEG unrevealing. TSH and cortisol within normal limits. Ammonia level normal, blood gas unrevealing  Active Problems:   AKI (acute kidney injury) in the setting of stage III chronic kidney disease: Slow to improve. Likely in part from poor by mouth intake as well as initial sepsis and hypotension. On IV fluids. Continue to monitor urine output and recheck labs in the morning    Hypoglycemia: Question accuracy. It was noted that when checking blood sugars from his right versus left hand, there was a discrepancy. The serum blood glucose actually corresponded better with the non-hypoglycemic level. suspect patient likely has peripheral vascular disease which may be referring with results.    Hypothermia: Resolved.   Sepsis/ Lactic acidosis: Source of infection unclear, so cannot truly define this as sepsis. Lactic acid level Slow to clear, likely in part  due to renal disease.  Hypernatremia: Starting trend up. Change IV fluids to half-normal saline.   Transaminitis: Noted bilirubin trending upward, as transaminases trended downward. This is likely from previous shock liver. Right upper quadrant ultrasound unrevealing.  Chronic systolic heart failure: No evidence of volume overload at this time. Patient looks more on the dry side. Elevated troponin more in the setting of hypotension? Sepsis and poor renal clearance.  History of chronic atrial fibrillation and complete heart block status post pacemaker. Not felt to be a candidate for anticoagulation secondary to risk of falls and cognitive impairment  Thrombocytopenia: Likely in the setting of sepsis Code Status: Full code for now  Family Communication: Spoke with sister at the bedside  Disposition Plan: Overall stable. Long-term prognosis questionable. We'll transfer to floor.   Consultants: Critical care   Procedures: None  Antibiotics:  Vancomycin: 4/11-present  Zosyn: 4/11-present   Objective: BP 135/80 mmHg  Pulse 75  Temp(Src) 98 F (36.7 C) (Axillary)  Resp 17  Ht  (1.803 m)  Wt 63.1 kg (139 lb 1.8 oz)  BMI 19.41 kg/m2  SpO2 97%  Intake/Output Summary (Last 24 hours) at 08/23/14 1601 Last data filed at 08/23/14 1013  Gross per 24 hour  Intake    850 ml  Output    850 ml  Net      0 ml   Filed Weights   08/20/14 2300 08/21/14 0115  Weight: 54.9 kg (121 lb 0.5 oz) 63.1 kg (139 lb 1.8 oz)    Exam:   General:  not really interactive, somnolent but wakes up randomly.  Cardiovascular: regular rate and rhythm, S1-S2, 2/6 systolic ejection murmur  Respiratory: Poor inspiratory effort, otherwise clear  Abdomen: Soft, nondistended, hypoactive bowel sounds  Musculoskeletal: Skin is smooth, poor circulation, suspect peripheral vascular disease as well as peripheral neuropathy  Data Reviewed: Basic Metabolic Panel:  Recent Labs Lab 08/20/14 1914  08/20/14 2305 08/21/14 0431 08/22/14 0411 08/23/14 0526 08/23/14 1000  NA 143  --  142 145 146*  --   K 5.3*  --  5.2* 4.5 3.4*  --   CL 109  --  112 114* 115*  --   CO2 13*  --  21 18* 20  --   GLUCOSE 84  --  135* 143* 166* 161*  BUN 71*  --  74* 78* 69*  --   CREATININE 2.39*  --  2.50* 2.50* 2.22*  --   CALCIUM 8.9  --  8.0* 7.9* 8.2*  --   MG  --  2.1  --   --   --   --    Liver Function Tests:  Recent Labs Lab 08/20/14 1914 08/21/14 0431 08/22/14 0411 08/23/14 0526  AST 135* 475* 422* 320*  ALT 74* 234* 313* 310*  ALKPHOS 89 79 79 79  BILITOT 2.3* 1.9* 1.6* 2.2*  PROT 5.6* 5.4* 5.1* 5.3*  ALBUMIN 3.0* 2.6* 2.5* 2.4*   No results for input(s): LIPASE, AMYLASE in the last 168 hours.  Recent Labs Lab 08/21/14 0431  AMMONIA 29   CBC:  Recent Labs Lab 08/20/14 1914 08/21/14 0431 08/22/14 0411 08/23/14 0526  WBC 9.7 9.7 10.2 8.9  NEUTROABS  --  8.6*  --   --   HGB 13.3 11.7* 12.2* 12.0*  HCT 41.6 35.3* 36.9* 36.5*  MCV 96.5 93.4 92.0 91.9  PLT 107* 98* 78* 68*   Cardiac Enzymes:    Recent Labs Lab 08/20/14 1914 08/21/14 0431 08/21/14 1214 08/21/14 2333  CKTOTAL 614*  --  1486*  --   TROPONINI 0.06* 0.10* 0.15* 0.17*   BNP (last 3 results)  Recent Labs  07/15/14 1531  BNP 1049.7*    ProBNP (last 3 results)  Recent Labs  10/22/13 1600  PROBNP 3408.0*    CBG:  Recent Labs Lab 08/23/14 0739 08/23/14 0934 08/23/14 0936 08/23/14 1210 08/23/14 1505  GLUCAP 103* 48* 127* 117* 114*    Recent Results (from the past 240 hour(s))  Urine culture     Status: None   Collection Time: 08/21/14 12:39 AM  Result Value Ref Range Status   Specimen Description URINE, CATHETERIZED  Final   Special Requests NONE  Final   Colony Count NO GROWTH Performed at [facility]   Final   Culture NO GROWTH Performed at [facility]   Final   Report Status 08/22/2014 FINAL  Final  MRSA PCR Screening     Status: None   Collection  Time: 08/21/14  1:13 AM  Result Value Ref Range Status   MRSA by PCR NEGATIVE NEGATIVE Final    Comment:        The GeneXpert MRSA Assay (FDA approved for NASAL specimens only), is one component of a comprehensive MRSA colonization surveillance program. It is not intended to diagnose MRSA infection nor to guide or monitor treatment for MRSA infections.   Blood culture (routine x 2)     Status: None (Preliminary result)   Collection Time: 08/21/14  2:20 AM  Result Value Ref Range Status   Specimen Description BLOOD LEFT ARM  Final   Special Requests BOTTLES DRAWN AEROBIC ONLY 1CC  Final   Culture   Final  BLOOD CULTURE RECEIVED NO GROWTH TO DATE CULTURE WILL BE HELD FOR 5 DAYS BEFORE ISSUING A FINAL NEGATIVE REPORT Performed at [facility]    Report Status PENDING  Incomplete     Studies: Dg Chest Port 1 View  08/22/2014   CLINICAL DATA:  Central line placement  EXAM: PORTABLE CHEST - 1 VIEW  COMPARISON:  08/21/2014  FINDINGS: Right PICC line has been placed with the tip in the lower SVC. Left pacer is unchanged. There is cardiomegaly. Small right pleural effusion is unchanged. Bibasilar atelectasis or infiltrates.  IMPRESSION: Right PICC line tip in the lower SVC.  Bibasilar atelectasis or infiltrates.  Small right effusion.  Stable cardiomegaly.   Electronically Signed   By: Charlett Nose M.D.   On: 08/22/2014 20:06   US Abdomen Limited Ruq  08/22/2014   CLINICAL DATA:  81 year old male with abnormal LFTs. History of hypertension. Initial encounter.  EXAM: US ABDOMEN LIMITED - RIGHT UPPER QUADRANT  COMPARISON:  None.  FINDINGS: Gallbladder:  No gallstones or gallbladder wall thickening. The patient was not able to respond to stimulation therefore not able to determine if the patient were tender over this region.  Common bile duct:  Diameter: 3.2 cm.  Liver:  Fatty infiltration liver suspected. Additionally, enlargement of the hepatic veins and inferior vena cava out  consistent with increased right heart pressure. Within the right lobe liver there is a septated 1.4 cm cyst.  Small amount of ascites.  IMPRESSION: No gallstones. No ultrasound evidence of cholecystitis. Patient was not able to respond to stimulation to determine if there was tenderness over this region.  Increased right heart pressures suspected with increased size of the inferior vena cava and hepatic veins.  Small amount of ascites.  Fatty infiltration of the liver.  1.4 cm septated cyst right lobe of the liver.   Electronically Signed   By: Lacy Duverney M.D.   On: 08/22/2014 14:59    Scheduled Meds: . antiseptic oral rinse  7 mL Mouth Rinse q12n4p  . chlorhexidine  15 mL Mouth Rinse BID  . metoprolol  2.5 mg Intravenous BID  . piperacillin-tazobactam (ZOSYN)  IV  2.25 g Intravenous Q8H  . sodium chloride  10-40 mL Intracatheter Q12H  . vancomycin  500 mg Intravenous Q24H    Continuous Infusions: . dextrose 75 mL/hr at 08/22/14 2036     Time spent: 40 minutes  Hollice Espy  [facility] Pager [phone]. If 7PM-7AM, please contact night-coverage at [email or web address], password [facility] 08/23/2014, 4:01 PM  LOS: 3 days

## 2014-08-23 NOTE — Evaluation (Signed)
Occupational Therapy Evaluation Patient Details Name: Charles Hendricks MRN: [id number] DOB: December 16, 1924 Today's Date: 08/23/2014    History of Present Illness Pt is an 81 y/o male with PMH of nonischemic cardiomyopathy (prior admission 2015 35%), chronic atrial fibrillation, complete heartblock s/p pacemaker placement, HTN, CKD. Pt was found at home on the floor confused, hypothermic, hypoglycemic and hypotensive. In ER pt unable to follow commands; confused. CXR revealed small R pleural effusion. CT revealed no acute intracranial abnormality. Stable chronic findings including remote left MCA distribution infarct.     Clinical Impression   Pt admitted with above. He demonstrates the below listed deficits and will benefit from continued OT to maximize safety and independence with BADLs.  Pt presents to OT with generalized weakness and cognitive deficits.  He was unable to follow one step commands during eval - pt self distracted by singing throughout the eval.  Currently, he requires total A for ADLs.  He will likely require SNF level rehab at discharge.       Follow Up Recommendations  SNF    Equipment Recommendations  None recommended by OT    Recommendations for Other Services       Precautions / Restrictions Precautions Precautions: Fall      Mobility Bed Mobility Overal bed mobility: Needs Assistance Bed Mobility: Supine to Sit;Sit to Supine     Supine to sit: Max assist Sit to supine: Max assist   General bed mobility comments: Pt required assist with all aspects   Transfers                 General transfer comment: Unable as pt pushed posteriorly resisting movement     Balance Overall balance assessment: Needs assistance Sitting-balance support: Single extremity supported Sitting balance-Leahy Scale: Poor Sitting balance - Comments: Pt initially required mod A, but progressed to min guard assist                                     ADL  Overall ADL's : Needs assistance/impaired Eating/Feeding: Total assistance   Grooming: Wash/dry hands;Wash/dry face;Oral care;Brushing hair;Total assistance;Sitting   Upper Body Bathing: Total assistance;Bed level;Sitting   Lower Body Bathing: Total assistance;Bed level   Upper Body Dressing : Total assistance;Bed level;Sitting   Lower Body Dressing: Total assistance;Bed level   Toilet Transfer: Total assistance Toilet Transfer Details (indicate cue type and reason): unable to perform  Toileting- Clothing Manipulation and Hygiene: Total assistance;Bed level         General ADL Comments: Attempted to perform simple grooming, but pt unable to follow commands and did not attempt to spontaneously assist when hand over hand assist provided      Vision     Perception     Praxis      Pertinent Vitals/Pain Pain Assessment: Faces Faces Pain Scale: No hurt     Hand Dominance  (unable to determine )   Extremity/Trunk Assessment Upper Extremity Assessment Upper Extremity Assessment: Generalized weakness   Lower Extremity Assessment Lower Extremity Assessment: Defer to PT evaluation   Cervical / Trunk Assessment Cervical / Trunk Assessment: Kyphotic   Communication Communication Communication: HOH   Cognition Arousal/Alertness: Awake/alert Behavior During Therapy: WFL for tasks assessed/performed Overall Cognitive Status: Impaired/Different from baseline Area of Impairment: Orientation;Attention;Following commands Orientation Level: Disoriented to;Place;Time;Situation Current Attention Level: Focused   Following Commands:  (unable to follow commands)       General Comments: Pt initially  able to answer basic questions with a delay; however, pt began to sing and self distracted every time something was asked of him.  He would stop singing, then attempt to initiate, but would start singing and unable to perform what was asked    General Comments       Exercises        Shoulder Instructions      Home Living Family/patient expects to be discharged to:: Unsure Living Arrangements: Alone Available Help at Discharge: Family;Available PRN/intermittently Type of Home: House                       Home Equipment: Gilmer Mor - single point   Additional Comments: All info was from previous stay      Prior Functioning/Environment Level of Independence: Independent with assistive device(s)        Comments: Per RN, pt lived alone and his 38 y.o. sister checked in on him    OT Diagnosis: Generalized weakness;Cognitive deficits   OT Problem List: Decreased strength;Decreased activity tolerance;Impaired balance (sitting and/or standing);Decreased coordination;Decreased cognition;Decreased safety awareness;Decreased knowledge of use of DME or AE;Cardiopulmonary status limiting activity   OT Treatment/Interventions: Self-care/ADL training;Therapeutic exercise;Neuromuscular education;DME and/or AE instruction;Therapeutic activities;Cognitive remediation/compensation;Patient/family education;Balance training    OT Goals(Current goals can be found in the care plan section) Acute Rehab OT Goals OT Goal Formulation: Patient unable to participate in goal setting Time For Goal Achievement: 09/06/14 Potential to Achieve Goals: Good ADL Goals Pt Will Perform Grooming: with min assist;sitting Pt Will Transfer to Toilet: with mod assist;stand pivot transfer;bedside commode Additional ADL Goal #1: Pt will follow one step commands 50% of the time to increase ability to perform ADLs   OT Frequency: Min 2X/week   Barriers to D/C: Decreased caregiver support          Co-evaluation              End of Session Nurse Communication: Mobility status  Activity Tolerance: Patient tolerated treatment well Patient left: in bed;with call bell/phone within reach   Time: 1610-9604 OT Time Calculation (min): 20 min Charges:  OT General Charges $OT Visit: 1  Procedure OT Evaluation $Initial OT Evaluation Tier I: 1 Procedure G-Codes:    Jeani Hawking M 03-Sep-2014, 4:43 PM

## 2014-08-23 NOTE — Progress Notes (Signed)
2000: Pt very confused, trying to get out of bed, and yelling out.  Pt very wheezy with tachypnea.Pt sats 100% on 3L [place].  Pt reoriented and Craige Cotta, NP notified. Pt given 0.5mg  ativan.  Pt resting comfortably at this time.

## 2014-08-23 NOTE — Progress Notes (Signed)
SLP Cancellation Note  Patient Details Name: Charles Hendricks MRN: [id number] DOB: 02/03/1925   Cancelled treatment:       Reason Eval/Treat Not Completed: Patient's level of consciousness. Pt still not sufficiently arousable for PO intake.    Reiko Vinje, Riley Nearing 08/23/2014, 10:39 AM

## 2014-08-24 ENCOUNTER — Encounter (HOSPITAL_COMMUNITY): Payer: Self-pay

## 2014-08-24 ENCOUNTER — Inpatient Hospital Stay (HOSPITAL_COMMUNITY): Payer: Medicare Other

## 2014-08-24 DIAGNOSIS — I5022 Chronic systolic (congestive) heart failure: Secondary | ICD-10-CM

## 2014-08-24 LAB — COMPREHENSIVE METABOLIC PANEL
ALT: 275 U/L — AB (ref 0–53)
AST: 231 U/L — ABNORMAL HIGH (ref 0–37)
Albumin: 2.3 g/dL — ABNORMAL LOW (ref 3.5–5.2)
Alkaline Phosphatase: 79 U/L (ref 39–117)
Anion gap: 12 (ref 5–15)
BUN: 64 mg/dL — ABNORMAL HIGH (ref 6–23)
CO2: 21 mmol/L (ref 19–32)
CREATININE: 2.16 mg/dL — AB (ref 0.50–1.35)
Calcium: 8.1 mg/dL — ABNORMAL LOW (ref 8.4–10.5)
Chloride: 115 mmol/L — ABNORMAL HIGH (ref 96–112)
GFR, EST AFRICAN AMERICAN: 30 mL/min — AB (ref >90)
GFR, EST NON AFRICAN AMERICAN: 26 mL/min — AB (ref >90)
GLUCOSE: 191 mg/dL — AB (ref 70–99)
Potassium: 3.1 mmol/L — ABNORMAL LOW (ref 3.5–5.1)
Sodium: 148 mmol/L — ABNORMAL HIGH (ref 135–145)
Total Bilirubin: 2.1 mg/dL — ABNORMAL HIGH (ref 0.3–1.2)
Total Protein: 4.7 g/dL — ABNORMAL LOW (ref 6.0–8.3)

## 2014-08-24 LAB — CBC
HEMATOCRIT: 35.4 % — AB (ref 39.0–52.0)
HEMOGLOBIN: 11.7 g/dL — AB (ref 13.0–17.0)
MCH: 30.9 pg (ref 26.0–34.0)
MCHC: 33.1 g/dL (ref 30.0–36.0)
MCV: 93.4 fL (ref 78.0–100.0)
PLATELETS: 62 10*3/uL — AB (ref 150–400)
RBC: 3.79 MIL/uL — ABNORMAL LOW (ref 4.22–5.81)
RDW: 16.7 % — AB (ref 11.5–15.5)
WBC: 8 10*3/uL (ref 4.0–10.5)

## 2014-08-24 LAB — GLUCOSE, CAPILLARY
GLUCOSE-CAPILLARY: 46 mg/dL — AB (ref 70–99)
Glucose-Capillary: 129 mg/dL — ABNORMAL HIGH (ref 70–99)
Glucose-Capillary: 160 mg/dL — ABNORMAL HIGH (ref 70–99)
Glucose-Capillary: 52 mg/dL — ABNORMAL LOW (ref 70–99)
Glucose-Capillary: 58 mg/dL — ABNORMAL LOW (ref 70–99)
Glucose-Capillary: 78 mg/dL (ref 70–99)
Glucose-Capillary: 82 mg/dL (ref 70–99)
Glucose-Capillary: 88 mg/dL (ref 70–99)
Glucose-Capillary: 96 mg/dL (ref 70–99)

## 2014-08-24 MED ORDER — DEXTROSE 50 % IV SOLN
INTRAVENOUS | Status: AC
  Administered 2014-08-24: 25 mL via INTRAVENOUS
  Filled 2014-08-24: qty 50

## 2014-08-24 MED ORDER — DEXTROSE 50 % IV SOLN
25.0000 mL | INTRAVENOUS | Status: AC
  Administered 2014-08-24: 25 mL via INTRAVENOUS

## 2014-08-24 MED ORDER — FUROSEMIDE 10 MG/ML IJ SOLN
40.0000 mg | Freq: Two times a day (BID) | INTRAMUSCULAR | Status: DC
  Administered 2014-08-24 – 2014-08-26 (×5): 40 mg via INTRAVENOUS
  Filled 2014-08-24 (×9): qty 4

## 2014-08-24 MED ORDER — MORPHINE SULFATE 2 MG/ML IJ SOLN
1.0000 mg | INTRAMUSCULAR | Status: DC | PRN
  Administered 2014-08-24 – 2014-08-25 (×2): 1 mg via INTRAVENOUS
  Filled 2014-08-24 (×2): qty 1

## 2014-08-24 MED ORDER — LORAZEPAM 2 MG/ML IJ SOLN
0.5000 mg | Freq: Once | INTRAMUSCULAR | Status: AC
  Administered 2014-08-24: 0.5 mg via INTRAVENOUS
  Filled 2014-08-24: qty 1

## 2014-08-24 MED ORDER — DEXTROSE 5 % IV SOLN
INTRAVENOUS | Status: DC
  Administered 2014-08-24 – 2014-08-25 (×2): via INTRAVENOUS
  Administered 2014-08-26: 1000 mL via INTRAVENOUS
  Administered 2014-08-27 (×2): via INTRAVENOUS

## 2014-08-24 MED ORDER — DEXTROSE 50 % IV SOLN
25.0000 mL | INTRAVENOUS | Status: DC | PRN
  Administered 2014-08-24: 25 mL via INTRAVENOUS

## 2014-08-24 NOTE — Progress Notes (Signed)
Hypoglycemic Event  CBG: 52  Treatment 25 ml  I/2  Ampule  D 50  given  Symptoms:pt  Remain Asymptomatic  Follow-up CBG: Time:0520 CBG Result:160  Possible Reasons for. Pt NPO  Comments/MD  NP Craige Cotta  In house Personnel on call for internal me    Laroy Apple  Remember to initiate Hypoglycemia Order Set & complete

## 2014-08-24 NOTE — Evaluation (Signed)
Speech Language Pathology Evaluation Patient Details Name: Charles Hendricks MRN: [id number] DOB: 09-05-1924 Today's Date: 08/24/2014 Time: 4098-1191 SLP Time Calculation (min) (ACUTE ONLY): 8 min  Problem List:  Patient Active Problem List   Diagnosis Date Noted  . Chronic systolic congestive heart failure 08/23/2014  . CKD stage G3b/A1, GFR 30 - 44 and albumin creatinine ratio <30 mg/g 08/23/2014  . Acute encephalopathy 08/21/2014  . Hypoglycemia 08/21/2014  . Hypothermia 08/21/2014  . Lactic acidosis 08/21/2014  . AKI (acute kidney injury) 08/20/2014  . NSVT (nonsustained ventricular tachycardia) 07/17/2014  . Acute on chronic systolic and diastolic heart failure, NYHA class 4 07/15/2014  . Weakness 02/16/2014  . CKD (chronic kidney disease), stage III   . Chronic atrial fibrillation 02/14/2014  . Psychotic disorder with delusions in conditions classified elsewhere 11/09/2013  . Mild cognitive impairment with memory loss 08/31/2013  . Chronic combined systolic and diastolic CHF (congestive heart failure) 06/07/2013  . Cognitive impairment 06/07/2013  . Pacemaker-Medtronic 02/11/2012  . HTN (hypertension), benign 02/11/2012  . Complete heart block s/p Medtronic pacemaker 02/11/2012   Past Medical History:  Past Medical History  Diagnosis Date  . Bradycardia   . Chronic atrial fibrillation     Sick Sinus syndrome 1998  . Complete heart block     a. Initial placement 2005. s/p Medtronic pacemaker gen change 2013.  Marland Kitchen HTN (hypertension), benign   . Pacemaker-Medtronic   . Non-ischemic cardiomyopathy     last EF 35% by Echo at [facility] 2008  . Gout   . Elevated PSA     asymptomatic  . Corns and callosities   . Cognitive impairment     a. Taken off Coumadin due to this.  . Chronic combined systolic and diastolic CHF (congestive heart failure)   . CKD (chronic kidney disease), stage III    Past Surgical History:  Past Surgical History  Procedure Laterality Date  . Pacemaker  insertion  1998    power source change 2005  . Permanent pacemaker generator change N/A 03/05/2012    Procedure: PERMANENT PACEMAKER GENERATOR CHANGE;  Surgeon: Marinus Maw, MD;  Location: [facility] CATH LAB;  Service: Cardiovascular;  Laterality: N/A;   HPI:  Pt is an 81 y/o male with PMH of nonischemic cardiomyopathy (prior admission 2015 35%), chronic atrial fibrillation, complete heartblock s/p pacemaker placement, HTN, CKD. Pt was found at home on the floor confused, hypothermic, hypoglycemic and hypotensive. In ER pt unable to follow commands; confused.  CXR revealed small R pleural effusion. CT revealed no acute intracranial abnormality. Stable chronic findings including remote left MCA distribution infarct.    Assessment / Plan / Recommendation Clinical Impression  Pt's reduced level of alertness and decreased focused attention impacts all higher levels of cognition at this time. Communication is limited as well, with little verbal communication and responses to yes/no questions. Pt required Max multimodal cues to follow <25% of one-step commands. Pt will benefit from SLP services to maximize functional communication and cognition.    SLP Assessment  Patient needs continued Speech Lanaguage Pathology Services    Follow Up Recommendations  Skilled Nursing facility    Frequency and Duration min 2x/week  2 weeks   Pertinent Vitals/Pain Pain Assessment: Faces Faces Pain Scale: No hurt   SLP Goals  Progression toward goals: Progressing toward goals Potential to Achieve Goals (ACUTE ONLY): Fair Potential Considerations (ACUTE ONLY): Other (comment) (unclear previous level of function)  SLP Evaluation Prior Functioning  Cognitive/Linguistic Baseline: Information not available   Cognition  Overall Cognitive Status: No family/caregiver present to determine baseline cognitive functioning Arousal/Alertness: Lethargic Orientation Level: Oriented to person;Disoriented to place Attention:  Focused;Sustained Focused Attention: Impaired Focused Attention Impairment: Verbal basic;Functional basic Sustained Attention: Impaired Sustained Attention Impairment: Functional basic Memory: Impaired Memory Impairment: Decreased recall of new information Awareness: Impaired Awareness Impairment: Intellectual impairment;Emergent impairment;Anticipatory impairment Problem Solving: Impaired Problem Solving Impairment: Functional basic    Comprehension  Auditory Comprehension Overall Auditory Comprehension: Impaired Yes/No Questions: Impaired Basic Biographical Questions: 0-25% accurate Commands: Impaired One Step Basic Commands: 0-24% accurate Interfering Components: Attention;Other (comment) (lethargy) Visual Recognition/Discrimination Discrimination: Not tested Reading Comprehension Reading Status: Not tested    Expression Expression Primary Mode of Expression: Verbal Verbal Expression Overall Verbal Expression: Impaired (pt with little verbal communication to assess) Written Expression Written Expression: Not tested   Oral / Motor Motor Speech Overall Motor Speech: Impaired Articulation: Impaired Level of Impairment: Word Intelligibility: Intelligibility reduced Word: 25-49% accurate   Maxcine Ham, M.A. CCC-SLP [phone]  Maxcine Ham 08/24/2014, 2:47 PM

## 2014-08-24 NOTE — Clinical Social Work Note (Signed)
BSW intern called POA but was unable to contact her. CSW will try again at a later time.   POA- Arther Abbott [phone], BSW Intern, [phone]

## 2014-08-24 NOTE — Care Management Note (Addendum)
    Page 1 of 1   08/29/2014     2:38:37 PM CARE MANAGEMENT NOTE 08/29/2014  Patient:  Charles, Hendricks   Account Number:  [id number]  Date Initiated:  08/23/2014  Documentation initiated by:  Hendricks,Charles  Subjective/Objective Assessment:   dx acute encephalopathy; lives alone    PCP  Pearla Dubonnet     Action/Plan:   Anticipated DC Date:  08/30/2014   Anticipated DC Plan:  SKILLED NURSING FACILITY  In-house referral  Clinical Social Worker      DC Planning Services  CM consult      Choice offered to / List presented to:             Status of service:  Completed, signed off Medicare Important Message given?  YES (If response is "NO", the following Medicare IM given date fields will be blank) Date Medicare IM given:  08/24/2014 Medicare IM given by:  Charles Hendricks Date Additional Medicare IM given:  08/28/2014 Additional Medicare IM given by:  Charles Hendricks  Discharge Disposition:  SKILLED NURSING FACILITY  Per UR Regulation:  Reviewed for med. necessity/level of care/duration of stay  If discussed at Long Length of Stay Meetings, dates discussed:   08/29/2014    Comments:  08/29/14 1233 Charles Cape RN, BSN [phone] patient is being diuresed and for dc tomorrow.  08/23/14 1355 Charles Mayo RN MSN BSN CCM PT eval reveals pt is max to total assist for mobility - recommending SNF, CSW following.

## 2014-08-24 NOTE — Progress Notes (Signed)
Speech Language Pathology Treatment: Dysphagia  Patient Details Name: Charles Hendricks MRN: [id number] DOB: May 01, 1925 Today's Date: 08/24/2014 Time: 9629-5284 SLP Time Calculation (min) (ACUTE ONLY): 8 min  Assessment / Plan / Recommendation Clinical Impression  Pt performance continues to be impacted largely by cognition, with decreased alertness and sustained attention to POs (see SLP cognitive evaluation for further details). Today he did consume few ice chip trials with Max cues for bolus acceptance, which resulted in delayed coughing and increase in wheezing. Will continue to follow to determine readiness for POs as mentation improves.   HPI HPI: Pt is an 81 y/o male with PMH of nonischemic cardiomyopathy (prior admission 2015 35%), chronic atrial fibrillation, complete heartblock s/p pacemaker placement, HTN, CKD. Pt was found at home on the floor confused, hypothermic, hypoglycemic and hypotensive. In ER pt unable to follow commands; confused.  CXR revealed small R pleural effusion. CT revealed no acute intracranial abnormality. Stable chronic findings including remote left MCA distribution infarct.    Pertinent Vitals Pain Assessment: Faces Faces Pain Scale: No hurt  SLP Plan  Continue with current plan of care    Recommendations Diet recommendations: NPO Medication Administration: Via alternative means       Oral Care Recommendations: Oral care Q4 per protocol Follow up Recommendations: Skilled Nursing facility Plan: Continue with current plan of care    Maxcine Ham, M.A. CCC-SLP [phone]  Maxcine Ham 08/24/2014, 2:38 PM

## 2014-08-24 NOTE — Progress Notes (Signed)
PROGRESS NOTE  Shaina Beaudet MLJ:[id number] DOB: 1924/07/18 DOA: 08/20/2014 PCP: Pearla Dubonnet, MD  HPI/Recap of past 24 hours: Patient is an 81 year old male past history nonischemic cardiomyopathy and ejection fraction of 35%, chronic atrial fibrillation status post pacemaker after complete heart block as well as chronic kidney disease who was admitted on 4/10 after being found on the floor confused. Initial CT scan of head unremarkable. Patient noted to be hypothermic, hypoglycemic and hypotensive with elevated lactic acid levels.  Patient's cause of encephalopathy has been unclear. Unable to get MRI due to pacemaker. Repeat CT scan done also unrevealing. Patient waxes and wanes with his mentation. He has not been fully alert enough to start a diet. Critical care consulted. Source of questionable infection not found. Patient has been hydrated.   Little improvement from previous day. Patient started looking slightly volume overloaded so started on Lasix on 4/13 evening. In that time he has diuresed almost 3 L. Renal function has remained stable. However he has not changed mentation and when stopping D5, Kierstan Auer becoming hypoglycemic.  Assessment/Plan: Principal Problem:   Acute encephalopathy: Patient still waxes and wanes. Mentation is still not strong enough for diet. Unclear underlying etiology. Repeat CT scan tomorrow.  EEG unrevealing. TSH and cortisol within normal limits. Ammonia level normal, blood gas unrevealing  Active Problems:   AKI (acute kidney injury) in the setting of stage III chronic kidney disease: Slow to improve. Likely in part from poor by mouth intake as well as initial sepsis and hypotension. On IV fluids. Continue to monitor urine output and recheck labs in the morning    Hypoglycemia: Initially Question accuracy. It was noted that when checking blood sugars from his right versus left hand, there was a discrepancy. The serum blood glucose actually corresponded  better with the non-hypoglycemic level. suspect patient likely has peripheral vascular disease which may be referring with results. However with stopping D5, patient still get hypoglycemic with results from "[facility]"    Hypothermia: Resolved.   Sepsis/ Lactic acidosis: Source of infection unclear, so cannot truly define this as sepsis. Lactic acid level Slow to clear, likely in part due to renal disease.  Hypernatremia: Starting trend up. Change IV fluids to half-normal saline.   Transaminitis: Noted bilirubin trending upward, as transaminases trended downward. This is likely from previous shock liver. Right upper quadrant ultrasound unrevealing.  Chronic systolic heart failure: No evidence of volume overload at this time. Patient looks more on the dry side. Elevated troponin more in the setting of hypotension? Sepsis and poor renal clearance.  History of chronic atrial fibrillation and complete heart block status post pacemaker. Not felt to be a candidate for anticoagulation secondary to risk of falls and cognitive impairment  Thrombocytopenia: Likely in the setting of sepsis Code Status: Full code for now  Family Communication: Left message with sister  Disposition Plan: Suspect that this is patient's new baseline and he may never recover. May need to opt for comfort care   Consultants: Critical care   Procedures: None  Antibiotics:  Vancomycin: 4/11-present  Zosyn: 4/11-present   Objective: BP 159/86 mmHg  Pulse 86  Temp(Src) 97.5 F (36.4 C) (Axillary)  Resp 20  Ht 5\' 11"  (1.803 m)  Wt 63.4 kg (139 lb 12.4 oz)  BMI 19.50 kg/m2  SpO2 100%  Intake/Output Summary (Last 24 hours) at 08/24/14 1815 Last data filed at 08/24/14 1758  Gross per 24 hour  Intake    270 ml  Output   4400 ml  Net  -4130 ml   Filed Weights   08/20/14 2300 08/21/14 0115 08/24/14 0446  Weight: 54.9 kg (121 lb 0.5 oz) 63.1 kg (139 lb 1.8 oz) 63.4 kg (139 lb 12.4 oz)     Exam:   General:  Minimally responsive  Cardiovascular: regular rate and rhythm, S1-S2, 2/6 systolic ejection murmur  Respiratory: Poor inspiratory effort, otherwise clear  Abdomen: Soft, nondistended, hypoactive bowel sounds  Musculoskeletal: Skin is smooth, poor circulation, suspect peripheral vascular disease as well as peripheral neuropathy  Data Reviewed: Basic Metabolic Panel:  Recent Labs Lab 08/20/14 1914 08/20/14 2305 08/21/14 0431 08/22/14 0411 08/23/14 0526 08/23/14 1000 08/24/14 0523  NA 143  --  142 145 146*  --  148*  K 5.3*  --  5.2* 4.5 3.4*  --  3.1*  CL 109  --  112 114* 115*  --  115*  CO2 13*  --  21 18* 20  --  21  GLUCOSE 84  --  135* 143* 166* 161* 191*  BUN 71*  --  74* 78* 69*  --  64*  CREATININE 2.39*  --  2.50* 2.50* 2.22*  --  2.16*  CALCIUM 8.9  --  8.0* 7.9* 8.2*  --  8.1*  MG  --  2.1  --   --   --   --   --    Liver Function Tests:  Recent Labs Lab 08/20/14 1914 08/21/14 0431 08/22/14 0411 08/23/14 0526 08/24/14 0523  AST 135* 475* 422* 320* 231*  ALT 74* 234* 313* 310* 275*  ALKPHOS 89 79 79 79 79  BILITOT 2.3* 1.9* 1.6* 2.2* 2.1*  PROT 5.6* 5.4* 5.1* 5.3* 4.7*  ALBUMIN 3.0* 2.6* 2.5* 2.4* 2.3*   No results for input(s): LIPASE, AMYLASE in the last 168 hours.  Recent Labs Lab 08/21/14 0431  AMMONIA 29   CBC:  Recent Labs Lab 08/20/14 1914 08/21/14 0431 08/22/14 0411 08/23/14 0526 08/24/14 0523  WBC 9.7 9.7 10.2 8.9 8.0  NEUTROABS  --  8.6*  --   --   --   HGB 13.3 11.7* 12.2* 12.0* 11.7*  HCT 41.6 35.3* 36.9* 36.5* 35.4*  MCV 96.5 93.4 92.0 91.9 93.4  PLT 107* 98* 78* 68* 62*   Cardiac Enzymes:    Recent Labs Lab 08/20/14 1914 08/21/14 0431 08/21/14 1214 08/21/14 2333  CKTOTAL 614*  --  1486*  --   TROPONINI 0.06* 0.10* 0.15* 0.17*   BNP (last 3 results)  Recent Labs  07/15/14 1531  BNP 1049.7*    ProBNP (last 3 results)  Recent Labs  10/22/13 1600  PROBNP 3408.0*     CBG:  Recent Labs Lab 08/24/14 0521 08/24/14 0744 08/24/14 1132 08/24/14 1642 08/24/14 1745  GLUCAP 160* 88 82 58* 96    Recent Results (from the past 240 hour(s))  Urine culture     Status: None   Collection Time: 08/21/14 12:39 AM  Result Value Ref Range Status   Specimen Description URINE, CATHETERIZED  Final   Special Requests NONE  Final   Colony Count NO GROWTH Performed at [facility]   Final   Culture NO GROWTH Performed at [facility]   Final   Report Status 08/22/2014 FINAL  Final  MRSA PCR Screening     Status: None   Collection Time: 08/21/14  1:13 AM  Result Value Ref Range Status   MRSA by PCR NEGATIVE NEGATIVE Final    Comment:  The GeneXpert MRSA Assay (FDA approved for NASAL specimens only), is one component of a comprehensive MRSA colonization surveillance program. It is not intended to diagnose MRSA infection nor to guide or monitor treatment for MRSA infections.   Blood culture (routine x 2)     Status: None (Preliminary result)   Collection Time: 08/21/14  2:20 AM  Result Value Ref Range Status   Specimen Description BLOOD LEFT ARM  Final   Special Requests BOTTLES DRAWN AEROBIC ONLY 1CC  Final   Culture   Final           BLOOD CULTURE RECEIVED NO GROWTH TO DATE CULTURE WILL BE HELD FOR 5 DAYS BEFORE ISSUING A FINAL NEGATIVE REPORT Performed at [facility]    Report Status PENDING  Incomplete  Culture, blood (routine x 2)     Status: None (Preliminary result)   Collection Time: 08/21/14  4:19 AM  Result Value Ref Range Status   Specimen Description BLOOD LEFT HAND  Final   Special Requests BOTTLES DRAWN AEROBIC ONLY 3CC  Final   Culture   Final           BLOOD CULTURE RECEIVED NO GROWTH TO DATE CULTURE WILL BE HELD FOR 5 DAYS BEFORE ISSUING A FINAL NEGATIVE REPORT Performed at [facility]    Report Status PENDING  Incomplete     Studies: Dg Chest Port 1 View  08/23/2014   CLINICAL  DATA:  One day history of wheezing. Atrial fibrillation, chronic  EXAM: PORTABLE CHEST - 1 VIEW  COMPARISON:  August 22, 2014.  FINDINGS: There is a small pleural effusion with bibasilar atelectasis, stable. There is new mild left perihilar edema. No consolidation. There is generalized cardiomegaly with pulmonary vascularity within normal limits. Pacemaker lead is attached to the right ventricle. Central catheter tip is in the superior cava. No pneumothorax. There is calcification in both carotid arteries.  IMPRESSION: Stable cardiomegaly. Small right effusion with bibasilar atelectatic change. New asymmetric interstitial prominence in the left perihilar region. Question early edema. There may be a degree of congestive heart failure.   Electronically Signed   By: Bretta Bang III M.D.   On: 08/23/2014 20:53    Scheduled Meds: . antiseptic oral rinse  7 mL Mouth Rinse q12n4p  . chlorhexidine  15 mL Mouth Rinse BID  . furosemide  40 mg Intravenous BID  . metoprolol  2.5 mg Intravenous BID  . piperacillin-tazobactam (ZOSYN)  IV  2.25 g Intravenous Q8H  . sodium chloride  10-40 mL Intracatheter Q12H  . vancomycin  500 mg Intravenous Q24H    Continuous Infusions: . dextrose       Time spent: 25 minutes  Hollice Espy  [facility] Pager [phone]. If 7PM-7AM, please contact night-coverage at [email or web address], password [facility] 08/24/2014, 6:15 PM  LOS: 4 days

## 2014-08-24 NOTE — Progress Notes (Signed)
MD notified of serum potassium 3.1; see orders, no replacement at this time. Will continue to monitor.

## 2014-08-24 NOTE — Progress Notes (Signed)
ANTIBIOTIC CONSULT NOTE - follow up Pharmacy Consult for vancomycin and Zosyn Indication: sepsis from uknown source  Allergies  Allergen Reactions  . Asa [Aspirin] Other (See Comments)    Told not to take med-per MD    Patient Measurements: Height: 5\' 11"  (180.3 cm) Weight: 139 lb 12.4 oz (63.4 kg) IBW/kg (Calculated) : 75.3  Vital Signs: Temp: 97.6 F (36.4 C) (04/14 0948) Temp Source: Axillary (04/14 0948) BP: 127/87 mmHg (04/14 0948) Pulse Rate: 68 (04/14 0948)  Labs:  Recent Labs  08/22/14 0411 08/23/14 0526 08/24/14 0523  WBC 10.2 8.9 8.0  HGB 12.2* 12.0* 11.7*  PLT 78* 68* 62*  CREATININE 2.50* 2.22* 2.16*   Estimated Creatinine Clearance: 21.2 mL/min (by C-G formula based on Cr of 2.16).   Assessment: 81yo male was found on floor in bedroom after family couldn't get in contact with pt, on arrival to ED had edema of all extremities and face, lactic acid significantly elevated. Possible HCAP: lungs clear, CXR neg; improving R pleural effusion. Covering for HCAP w/ recent hospitalization in March SIRS: no obvious source of infxn. Last LA 4, PCT 1.35.  Vanc 4/11>> Zosyn 4/11>>  4/11 Ucx: neg 4/11 Bcx: NGTD  Scr slightly better at 2.16, CrCl still only ~20-57mL/min. UOP has picked up to 1.47mL/kg/hr, however also note patient is on Lasix.    Goal of Therapy:   vancomycin trough level 15 -20 mcg/mL  Plan:  -Vancomycin 500 IV q24h -Continue zosyn 2.25 q8h- will adjust when CrCl is more improved -F/u renal fxn, temp, culture data -vanc trough as needed  Dahlton Hinde D. Nico Syme, PharmD, BCPS Clinical Pharmacist Pager: [phone] 08/24/2014 12:21 PM

## 2014-08-24 NOTE — Progress Notes (Signed)
Pt. Getting ready to go to vascular lab for a procedure and was found to have some wheezing.  Resp. Therapy called for breathing treatment but unable to come, breathing treatment administered by this RN.  Pt was also moaning while RN was repositioning him.  No prn ordered, Dr. Rito Ehrlich text page and called received back with new orders.  Will continue to monitor.  Forbes Cellar, RN

## 2014-08-25 DIAGNOSIS — E870 Hyperosmolality and hypernatremia: Secondary | ICD-10-CM

## 2014-08-25 DIAGNOSIS — I5023 Acute on chronic systolic (congestive) heart failure: Secondary | ICD-10-CM

## 2014-08-25 DIAGNOSIS — I739 Peripheral vascular disease, unspecified: Secondary | ICD-10-CM

## 2014-08-25 LAB — GLUCOSE, CAPILLARY
GLUCOSE-CAPILLARY: 105 mg/dL — AB (ref 70–99)
Glucose-Capillary: 102 mg/dL — ABNORMAL HIGH (ref 70–99)
Glucose-Capillary: 120 mg/dL — ABNORMAL HIGH (ref 70–99)
Glucose-Capillary: 56 mg/dL — ABNORMAL LOW (ref 70–99)
Glucose-Capillary: 67 mg/dL — ABNORMAL LOW (ref 70–99)
Glucose-Capillary: 69 mg/dL — ABNORMAL LOW (ref 70–99)
Glucose-Capillary: 71 mg/dL (ref 70–99)
Glucose-Capillary: 85 mg/dL (ref 70–99)
Glucose-Capillary: 95 mg/dL (ref 70–99)

## 2014-08-25 LAB — PROCALCITONIN: Procalcitonin: 0.65 ng/mL

## 2014-08-25 NOTE — Progress Notes (Signed)
PROGRESS NOTE  Charles Hendricks DUK:[id number] DOB: 1924-10-24 DOA: 08/20/2014 PCP: Pearla Dubonnet, MD  HPI/Recap of past 24 hours: Patient is an 81 year old male past history nonischemic cardiomyopathy and ejection fraction of 35%, chronic atrial fibrillation status post pacemaker after complete heart block as well as chronic kidney disease who was admitted on 4/10 after being found on the floor confused. Initial CT scan of head unremarkable. Patient noted to be hypothermic, hypoglycemic and hypotensive with elevated lactic acid levels.  Patient's cause of encephalopathy has been unclear. Unable to get MRI due to pacemaker. Repeat CT scan done also unrevealing. Patient waxes and wanes with his mentation. He has not been fully alert enough to start a diet. Critical care consulted. Source of questionable infection not found. Patient has been hydrated.   Little improvement from previous day. Patient started looking slightly volume overloaded so started on Lasix on 4/13 evening. In that time he has diuresed almost 3 L. Renal function has remained stable. However he has not changed mentation and when stopping D5, he becomes hypoglycemic. Unfortunately in after several days patient has had little improvement mentation and cannot stay awake or alert enough to eat. 2 much fluid hydration has led to volume overload requiring Lasix, which causes hypernatremia worsening. Patient unable to give me any subjective complaints   Assessment/Plan: Principal Problem:   Acute encephalopathy: Patient still waxes and wanes. Mentation is still not strong enough for diet. Unclear underlying etiology. Repeat CT scan tomorrow.  EEG unrevealing. TSH and cortisol within normal limits. Ammonia level normal, blood gas unrevealing  Active Problems:   AKI (acute kidney injury) in the setting of stage III chronic kidney disease: Slow to improve. Likely in part from poor by mouth intake as well as initial sepsis and  hypotension. Improving actually with Lasix Continue to monitor urine output and recheck labs in the morning    Hypoglycemia: Initially Question accuracy. It was noted that when checking blood sugars from his right versus left hand, there was a discrepancy. The serum blood glucose actually corresponded better with the non-hypoglycemic level. suspect patient likely has peripheral vascular disease which may be referring with results. However with stopping D5, patient still get hypoglycemic with results from "[facility]"    Hypothermia: Resolved.   Sepsis/ Lactic acidosis: Source of infection unclear, so cannot truly define this as sepsis. Discontinue antibiotics after 4/17 if not sooner Lactic acid level Slow to clear, likely in part due to renal disease.  Hypernatremia: In the setting of diuresis and poor by mouth intake, sodium becoming more concentrated   Transaminitis: Noted bilirubin trending upward, as transaminases trended downward. This is likely from previous shock liver. Right upper quadrant ultrasound unrevealing.  Acute on Chronic systolic heart failure: Following aggressive hydration, have started Lasix for diuresis  Elevated troponin more in the setting of hypotension? Sepsis and poor renal clearance.  History of chronic atrial fibrillation and complete heart block status post pacemaker. Not felt to be a candidate for anticoagulation secondary to risk of falls and cognitive impairment  Thrombocytopenia: Likely in the setting of sepsis Code Status: Full code for now  Family Communication: Spoke with sister and granddaughter  Disposition Plan: Spoke with the patient's sister and granddaughter (who is medical power of attorney) and they seem to be in understanding of the very poor prognosis. They do not want him to suffer. They will discuss with the family members and I will touch base with the granddaughter tomorrow the patient will likely be made comfort  care with plans to transition  to hospice facility. I expect that likely he will pass the next week   Consultants: Critical care   Procedures: None  Antibiotics:  Vancomycin: 4/11-present  Zosyn: 4/11-present   Objective: BP 143/76 mmHg  Pulse 113  Temp(Src) 97.8 F (36.6 C) (Oral)  Resp 28  Ht  (1.803 m)  Wt 58.1 kg (128 lb 1.4 oz)  BMI 17.87 kg/m2  SpO2 93%  Intake/Output Summary (Last 24 hours) at 08/25/14 1527 Last data filed at 08/25/14 1045  Gross per 24 hour  Intake    750 ml  Output   3350 ml  Net  -2600 ml   Filed Weights   08/21/14 0115 08/24/14 0446 08/25/14 0450  Weight: 63.1 kg (139 lb 1.8 oz) 63.4 kg (139 lb 12.4 oz) 58.1 kg (128 lb 1.4 oz)    Exam: No change, occasionally moans  General:  Minimally responsive  Cardiovascular: regular rate and rhythm, S1-S2, 2/6 systolic ejection murmur  Respiratory: Poor inspiratory effort, otherwise clear  Abdomen: Soft, nondistended, hypoactive bowel sounds  Musculoskeletal: Skin is smooth, poor circulation, suspect peripheral vascular disease as well as peripheral neuropathy  Data Reviewed: Basic Metabolic Panel:  Recent Labs Lab 08/20/14 1914 08/20/14 2305 08/21/14 0431 08/22/14 0411 08/23/14 0526 08/23/14 1000 08/24/14 0523  NA 143  --  142 145 146*  --  148*  K 5.3*  --  5.2* 4.5 3.4*  --  3.1*  CL 109  --  112 114* 115*  --  115*  CO2 13*  --  21 18* 20  --  21  GLUCOSE 84  --  135* 143* 166* 161* 191*  BUN 71*  --  74* 78* 69*  --  64*  CREATININE 2.39*  --  2.50* 2.50* 2.22*  --  2.16*  CALCIUM 8.9  --  8.0* 7.9* 8.2*  --  8.1*  MG  --  2.1  --   --   --   --   --    Liver Function Tests:  Recent Labs Lab 08/20/14 1914 08/21/14 0431 08/22/14 0411 08/23/14 0526 08/24/14 0523  AST 135* 475* 422* 320* 231*  ALT 74* 234* 313* 310* 275*  ALKPHOS 89 79 79 79 79  BILITOT 2.3* 1.9* 1.6* 2.2* 2.1*  PROT 5.6* 5.4* 5.1* 5.3* 4.7*  ALBUMIN 3.0* 2.6* 2.5* 2.4* 2.3*   No results for input(s): LIPASE, AMYLASE  in the last 168 hours.  Recent Labs Lab 08/21/14 0431  AMMONIA 29   CBC:  Recent Labs Lab 08/20/14 1914 08/21/14 0431 08/22/14 0411 08/23/14 0526 08/24/14 0523  WBC 9.7 9.7 10.2 8.9 8.0  NEUTROABS  --  8.6*  --   --   --   HGB 13.3 11.7* 12.2* 12.0* 11.7*  HCT 41.6 35.3* 36.9* 36.5* 35.4*  MCV 96.5 93.4 92.0 91.9 93.4  PLT 107* 98* 78* 68* 62*   Cardiac Enzymes:    Recent Labs Lab 08/20/14 1914 08/21/14 0431 08/21/14 1214 08/21/14 2333  CKTOTAL 614*  --  1486*  --   TROPONINI 0.06* 0.10* 0.15* 0.17*   BNP (last 3 results)  Recent Labs  07/15/14 1531  BNP 1049.7*    ProBNP (last 3 results)  Recent Labs  10/22/13 1600  PROBNP 3408.0*    CBG:  Recent Labs Lab 08/25/14 0223 08/25/14 0404 08/25/14 0819 08/25/14 0820 08/25/14 1151  GLUCAP 71 85 69* 105* 102*    Recent Results (from the past 240 hour(s))  Urine  culture     Status: None   Collection Time: 08/21/14 12:39 AM  Result Value Ref Range Status   Specimen Description URINE, CATHETERIZED  Final   Special Requests NONE  Final   Colony Count NO GROWTH Performed at [facility]   Final   Culture NO GROWTH Performed at [facility]   Final   Report Status 08/22/2014 FINAL  Final  MRSA PCR Screening     Status: None   Collection Time: 08/21/14  1:13 AM  Result Value Ref Range Status   MRSA by PCR NEGATIVE NEGATIVE Final    Comment:        The GeneXpert MRSA Assay (FDA approved for NASAL specimens only), is one component of a comprehensive MRSA colonization surveillance program. It is not intended to diagnose MRSA infection nor to guide or monitor treatment for MRSA infections.   Blood culture (routine x 2)     Status: None (Preliminary result)   Collection Time: 08/21/14  2:20 AM  Result Value Ref Range Status   Specimen Description BLOOD LEFT ARM  Final   Special Requests BOTTLES DRAWN AEROBIC ONLY 1CC  Final   Culture   Final           BLOOD CULTURE RECEIVED  NO GROWTH TO DATE CULTURE WILL BE HELD FOR 5 DAYS BEFORE ISSUING A FINAL NEGATIVE REPORT Performed at [facility]    Report Status PENDING  Incomplete  Culture, blood (routine x 2)     Status: None (Preliminary result)   Collection Time: 08/21/14  4:19 AM  Result Value Ref Range Status   Specimen Description BLOOD LEFT HAND  Final   Special Requests BOTTLES DRAWN AEROBIC ONLY 3CC  Final   Culture   Final           BLOOD CULTURE RECEIVED NO GROWTH TO DATE CULTURE WILL BE HELD FOR 5 DAYS BEFORE ISSUING A FINAL NEGATIVE REPORT Performed at [facility]    Report Status PENDING  Incomplete     Studies: Ct Head Wo Contrast  08/24/2014   CLINICAL DATA:  Follow-up encephalopathy. Headache. Question stroke.  EXAM: CT HEAD WITHOUT CONTRAST  TECHNIQUE: Contiguous axial images were obtained from the base of the skull through the vertex without intravenous contrast.  COMPARISON:  Head CT 08/20/2014  FINDINGS: No intracranial hemorrhage. Stable atrophy and chronic small vessel ischemia. Stable remote left MCA distribution infarct. No signs of acute or subacute ischemia. No cerebral edema. Basal gangliar calcifications again seen. No subdural or extra-axial collection. Mild inflammatory change but paranasal sinuses, stable. No acute bony abnormality.  IMPRESSION: 1. No acute intracranial abnormality. No signs of acute or subacute ischemia. 2. Stable chronic change.   Electronically Signed   By: Rubye Oaks M.D.   On: 08/24/2014 22:14    Scheduled Meds: . antiseptic oral rinse  7 mL Mouth Rinse q12n4p  . chlorhexidine  15 mL Mouth Rinse BID  . furosemide  40 mg Intravenous BID  . metoprolol  2.5 mg Intravenous BID  . piperacillin-tazobactam (ZOSYN)  IV  2.25 g Intravenous Q8H  . sodium chloride  10-40 mL Intracatheter Q12H  . vancomycin  500 mg Intravenous Q24H    Continuous Infusions: . dextrose 50 mL/hr at 08/25/14 1310     Time spent: 15 minutes  Hollice Espy  [facility] Pager [phone]. If 7PM-7AM, please contact night-coverage at [email or web address], password [facility] 08/25/2014, 3:27 PM  LOS: 5 days

## 2014-08-25 NOTE — Progress Notes (Signed)
Nutrition Brief Note  Chart reviewed. Pt now transitioning to comfort care.  No further nutrition interventions warranted at this time.  Please re-consult as needed.   Brendalyn Vallely A. Kieran Nachtigal, RD, LDN, CDE Pager: [phone] After hours Pager: [phone]  

## 2014-08-25 NOTE — Progress Notes (Signed)
Physical Therapy Treatment Patient Details Name: Charles Hendricks MRN: [id number] DOB: 1924/12/26 Today's Date: 08/25/2014    History of Present Illness Pt is an 81 y/o male with PMH of nonischemic cardiomyopathy (prior admission 2015 35%), chronic atrial fibrillation, complete heartblock s/p pacemaker placement, HTN, CKD. Pt was found at home on the floor confused, hypothermic, hypoglycemic and hypotensive. In ER pt unable to follow commands; confused. CXR revealed small R pleural effusion. CT revealed no acute intracranial abnormality. Stable chronic findings including remote left MCA distribution infarct.      PT Comments    Pt unable to focus on task and needs extensive cuing and assist.  Expect continued slow progress until mentation clears.   Follow Up Recommendations  SNF;Supervision/Assistance - 24 hour     Equipment Recommendations  Other (comment)    Recommendations for Other Services       Precautions / Restrictions Precautions Precautions: Fall    Mobility  Bed Mobility Overal bed mobility: Needs Assistance Bed Mobility: Supine to Sit;Sit to Supine     Supine to sit: Max assist;+2 for safety/equipment Sit to supine: Max assist;+2 for safety/equipment   General bed mobility comments: Pt required assist with all aspects   Transfers Overall transfer level: Needs assistance Equipment used: 2 person hand held assist Transfers: Sit to/from Stand Sit to Stand: Mod assist;+2 physical assistance;From elevated surface         General transfer comment: pt needed consistent redirection and cues for guidance, assist to come forward and stand.  Ambulation/Gait             General Gait Details: unable today   Stairs            Wheelchair Mobility    Modified Rankin (Stroke Patients Only) Modified Rankin (Stroke Patients Only) Pre-Morbid Rankin Score: Moderate disability Modified Rankin: Severe disability     Balance Overall balance assessment:  Needs assistance   Sitting balance-Leahy Scale: Poor Sitting balance - Comments: At EOB>6 min working to get pt to activate trunk and stay forward, but pt unable to focus on task and needed constant assist     Standing balance-Leahy Scale: Zero Standing balance comment: moderate posterior lean overall                    Cognition Arousal/Alertness: Awake/alert Behavior During Therapy: Restless;Anxious Overall Cognitive Status: No family/caregiver present to determine baseline cognitive functioning Area of Impairment: Orientation;Attention;Following commands Orientation Level: Disoriented to;Place;Time;Situation Current Attention Level: Focused   Following Commands: Follows one step commands inconsistently;Follows one step commands with increased time     Problem Solving: Difficulty sequencing;Slow processing;Decreased initiation;Requires verbal cues;Requires tactile cues      Exercises      General Comments        Pertinent Vitals/Pain Pain Assessment: Faces Faces Pain Scale: Hurts even more Pain Location: vague reports Pain Descriptors / Indicators: Grimacing Pain Intervention(s): Monitored during session    Home Living                      Prior Function            PT Goals (current goals can now be found in the care plan section) Acute Rehab PT Goals Patient Stated Goal: unable to state PT Goal Formulation: Patient unable to participate in goal setting Time For Goal Achievement: 09/05/14 Potential to Achieve Goals: Fair Progress towards PT goals: Not progressing toward goals - comment (needing constant redirection)    Frequency  Min 2X/week    PT Plan Current plan remains appropriate    Co-evaluation             End of Session   Activity Tolerance: Other (comment);Patient limited by fatigue (limited mostly by cognition) Patient left: in bed;with call bell/phone within reach;with bed alarm set     Time: 1510-1536 PT Time  Calculation (min) (ACUTE ONLY): 26 min  Charges:  $Therapeutic Activity: 23-37 mins                    G Codes:      Elias Dennington, Eliseo Gum 08/25/2014, 5:31 PM 08/25/2014  [place], PT [phone]  (pager)

## 2014-08-25 NOTE — Progress Notes (Signed)
SLP Cancellation Note  Patient Details Name: Charles Hendricks MRN: [id number] DOB: Nov 11, 1924   Cancelled treatment:       Reason Eval/Treat Not Completed: Other (comment). Per RN pt transitioning to comfort measures. Pt still appears lethargic. SLP will sign off at this time. Please reconsult if any assistance is needed.   Harlon Ditty, MA CCC-SLP [phone]  Claudine Mouton 08/25/2014, 9:42 AM

## 2014-08-25 NOTE — Clinical Social Work Placement (Addendum)
Clinical Social Work Department CLINICAL SOCIAL WORK PLACEMENT NOTE 08/25/2014  Patient:  Charles Hendricks, Charles Hendricks  Account Number:  [id number] Admit date:  08/20/2014  Clinical Social Worker:  Gwynne Edinger, LCSWA  Date/time:  08/25/2014 02:27 PM  Clinical Social Work is seeking post-discharge placement for this patient at the following level of care:   SKILLED NURSING   (*CSW will update this form in Epic as items are completed)   08/25/2014  Patient/family provided with [facility] Department of Clinical Social Work's list of facilities offering this level of care within the geographic area requested by the patient (or if unable, by the patient's family).  08/25/2014  Patient/family informed of their freedom to choose among providers that offer the needed level of care, that participate in Medicare, Medicaid or managed care program needed by the patient, have an available bed and are willing to accept the patient.  08/25/2014  Patient/family informed of [facility]' ownership interest in [facility], as well as of the fact that they are under no obligation to receive care at this facility.  PASARR submitted to EDS on 08/24/2014 PASARR number received on 08/24/2014  FL2 transmitted to all facilities in geographic area requested by pt/family on  08/24/2014 FL2 transmitted to all facilities within larger geographic area on 08/24/2014  Patient informed that his/her managed care company has contracts with or will negotiate with  certain facilities, including the following:     Patient/family informed of bed offers received:  08/29/2014 Patient chooses bed at [facility] Physician recommends and patient chooses bed at    Patient to be transferred to [facility] on  08/30/2014   Patient to be transferred to facility by [facility] Patient and family notified of transfer on 08/30/2014 Name of family member notified: Charles Hendricks (patient granddaughter over the phone)    The following physician request were entered in Epic:   Additional Comments:  Dysheka Bibbs, MSW, [employer] [phone]

## 2014-08-25 NOTE — Progress Notes (Signed)
VASCULAR LAB PRELIMINARY  ARTERIAL  ABI completed:    RIGHT    LEFT    PRESSURE WAVEFORM  PRESSURE WAVEFORM  BRACHIAL PICC line Biphasic BRACHIAL 140 Biphasic  DP  Absent DP  Absent  AT  Absent AT  Absent  PT >314 Dampened Monophasic PT >301 Dampened Monophasic  PER  Absent PER  Absent  GREAT TOE  Absent GREAT TOE  Absent    RIGHT LEFT  ABI Not Ascertained Not Ascertained.   Technically difficult due to involuntary movement of the legs. ABIs could not be ascertained due to non compressible vessels probably due to calcification.   Korinna Tat, RVS 08/25/2014, 6:47 PM

## 2014-08-25 NOTE — Clinical Social Work Psychosocial (Signed)
Clinical Social Work Department BRIEF PSYCHOSOCIAL ASSESSMENT 08/25/2014  Patient:  Charles Hendricks, Charles Hendricks     Account Number:  [id number]     Admit date:  08/20/2014  Clinical Social Worker:  Bo Mcclintock  Date/Time:  08/25/2014 02:08 PM  Referred by:  RN  Date Referred:  08/25/2014 Referred for  SNF Placement   Other Referral:   Interview type:  Family Other interview type:   Pt is disoriented; pt's sister Sandi Mealy and granddaughter Dicie Beam [phone]. Per Johnsie Kindred is the POA.    PSYCHOSOCIAL DATA Living Status:  ALONE Admitted from facility:   Level of care:   Primary support name:  Sandi Mealy and Dicie Beam Primary support relationship to patient:  FAMILY Degree of support available:   Limit suppose Delaney Meigs does not live locate, Merdis Delay is 81 years old)    CURRENT CONCERNS Current Concerns  None Noted   Other Concerns:    SOCIAL WORK ASSESSMENT / PLAN CSW spoke with the pt's sister Merdis Delay and granddaughter Delaney Meigs.  CSW introduced self and purpose of the call. CSW discussed clinical recommendation for SNF rehab. CSW inquired about the geographical location in which the pt would like to receive rehab from. CSW explained the SNF rehab process to the family. CSW and family discussed insurance and its relation to SNF rehab. CSW answered all questions in which the family inquired about. CSW provided family with contact information for further questions. CSW will continue to follow this pt and assist with discharge as needed.   Assessment/plan status:  Psychosocial Support/Ongoing Assessment of Needs Other assessment/ plan:   Information/referral to community resources:    PATIENT'S/FAMILY'S RESPONSE TO CURRENT DIAGNOSE: Merdis Delay is unrealistic about the pt's prognosis. Merdis Delay would like the pt to come home. She is hopeful that the pt will recover and become independent again. Delaney Meigs explained to [place] that the pt is total assistance and can not return  home alone. Delaney Meigs expressed interested in 24 hour care for the pt.   PATIENT'S/FAMILY'S RESPONSE TO PLAN OF CARE: Family is open to clinical teams recommendation. Family is awaiting to talk to the MD to gather a better understand of the prognosis.    Laddie Math, MSW, LCSWA [phone]

## 2014-08-26 LAB — BASIC METABOLIC PANEL
Anion gap: 17 — ABNORMAL HIGH (ref 5–15)
BUN: 47 mg/dL — ABNORMAL HIGH (ref 6–23)
CALCIUM: 8.2 mg/dL — AB (ref 8.4–10.5)
CO2: 21 mmol/L (ref 19–32)
CREATININE: 2.09 mg/dL — AB (ref 0.50–1.35)
Chloride: 105 mmol/L (ref 96–112)
GFR calc Af Amer: 31 mL/min — ABNORMAL LOW (ref >90)
GFR, EST NON AFRICAN AMERICAN: 27 mL/min — AB (ref >90)
Glucose, Bld: 115 mg/dL — ABNORMAL HIGH (ref 70–99)
Potassium: 3 mmol/L — ABNORMAL LOW (ref 3.5–5.1)
Sodium: 143 mmol/L (ref 135–145)

## 2014-08-26 LAB — GLUCOSE, CAPILLARY
GLUCOSE-CAPILLARY: 65 mg/dL — AB (ref 70–99)
GLUCOSE-CAPILLARY: 106 mg/dL — AB (ref 70–99)
GLUCOSE-CAPILLARY: 88 mg/dL (ref 70–99)
GLUCOSE-CAPILLARY: 21 mg/dL — AB (ref 70–99)
Glucose-Capillary: 97 mg/dL (ref 70–99)
Glucose-Capillary: 149 mg/dL — ABNORMAL HIGH (ref 70–99)
Glucose-Capillary: 191 mg/dL — ABNORMAL HIGH (ref 70–99)

## 2014-08-26 LAB — CBC
HCT: 39.8 % (ref 39.0–52.0)
Hemoglobin: 13.2 g/dL (ref 13.0–17.0)
MCH: 30.6 pg (ref 26.0–34.0)
MCHC: 33.2 g/dL (ref 30.0–36.0)
MCV: 92.3 fL (ref 78.0–100.0)
Platelets: 76 10*3/uL — ABNORMAL LOW (ref 150–400)
RBC: 4.31 MIL/uL (ref 4.22–5.81)
RDW: 16.7 % — ABNORMAL HIGH (ref 11.5–15.5)
WBC: 7.4 10*3/uL (ref 4.0–10.5)

## 2014-08-26 MED ORDER — DEXTROSE 50 % IV SOLN
INTRAVENOUS | Status: AC
  Filled 2014-08-26: qty 50

## 2014-08-26 MED ORDER — DEXTROSE 50 % IV SOLN
50.0000 mL | Freq: Once | INTRAVENOUS | Status: AC
  Administered 2014-08-26 (×2): 50 mL via INTRAVENOUS

## 2014-08-26 MED ORDER — DEXTROSE 50 % IV SOLN
INTRAVENOUS | Status: AC
  Administered 2014-08-26: 50 mL via INTRAVENOUS
  Filled 2014-08-26: qty 50

## 2014-08-26 MED ORDER — DEXTROSE 50 % IV SOLN
25.0000 g | Freq: Once | INTRAVENOUS | Status: AC
  Administered 2014-08-26: 25 g via INTRAVENOUS

## 2014-08-26 NOTE — Progress Notes (Signed)
Hypoglycemic Event  CBG: 65  Treatment: Dextrose 77ml given  Symptoms: none noted  Follow-up CBG: Time:0500 CBG Result:149  Possible Reasons for Event: suspect pt npo for extended period  Comments/MD notified:Dr on call notified. No return call received at this time.  Continuing to monitor pt.      Oak Dorey P  Remember to initiate Hypoglycemia Order Set & complete

## 2014-08-26 NOTE — Progress Notes (Signed)
Hypoglycemic Event  CBG: 67   Treatment: Dextrose 90ml  Symptoms: none noted  Follow-up CBG: Time: 0130 CBG Result:97  Possible Reasons for Event: Pt NPO for extensive period of time.    Comments/MD notified:Dr on call notified of event.  No return call made at this time.  Return call not necessary.    Charles Hendricks P  Remember to initiate Hypoglycemia Order Set & complete

## 2014-08-26 NOTE — Progress Notes (Signed)
PROGRESS NOTE  Charles Hendricks WUJ:[id number] DOB: [birth year] DOA: 08/20/2014 PCP: Pearla Dubonnet, MD  HPI/Recap of past 24 hours: Patient is an 81 year old male past history nonischemic cardiomyopathy and ejection fraction of 35%, chronic atrial fibrillation status post pacemaker after complete heart block as well as chronic kidney disease who was admitted on 4/10 after being found on the floor confused. Initial CT scan of head unremarkable. Patient noted to be hypothermic, hypoglycemic and hypotensive with elevated lactic acid levels.  Patient's cause of encephalopathy has been unclear. Unable to get MRI due to pacemaker. Repeat CT scan done also unrevealing. Patient waxes and wanes with his mentation. He has not been fully alert enough to start a diet. Critical care consulted. Source of questionable infection not found. Patient has been hydrated.   Little improvement from previous day. Patient started looking slightly volume overloaded so started on Lasix on 4/13 evening. In that time he has diuresed almost 3 L. Renal function has remained stable. However he has not changed mentation and when stopping D5, he becomes hypoglycemic. Unfortunately in after several days patient has had little improvement mentation and cannot stay awake or alert enough to eat. 2 much fluid hydration has led to volume overload requiring Lasix, which causes hypernatremia worsening. Patient unable to give me any subjective complaints   After discussion with the patient's granddaughter, who is the patient's healthcare power of attorney, she and her sister have opted to make the patient comfort care and have changed him to a DO NOT RESUSCITATE status.  Assessment/Plan: Principal Problem:   Acute encephalopathy: Patient still waxes and wanes. Mentation is still not strong enough for diet. Unclear underlying etiology. Repeat CT scan unrevealing EEG unrevealing. TSH and cortisol within normal limits. Ammonia level normal,  blood gas unrevealing.  Likely this is a progression of his dementia  Active Problems:   AKI (acute kidney injury) in the setting of stage III chronic kidney disease: Slow to improve. Likely in part from poor by mouth intake as well as initial sepsis and hypotension. Improving actually with Lasix continue Lasix for comfort, but no further following labs as he is now comfort care.     Hypoglycemia: Initially Question accuracy. It was noted that when checking blood sugars from his right versus left hand, there was a discrepancy. The serum blood glucose actually corresponded better with the non-hypoglycemic level. suspect patient likely has peripheral vascular disease which may be referring with results. However with stopping D5, patient still get hypoglycemic with results from "good hand". We'll continue with D5W, but will stop checking blood sugars    Hypothermia: Resolved.   Sepsis/ Lactic acidosis: Source of infection unclear, so cannot truly define this as sepsis. Discontinue antibiotics after 4/17 if not sooner Lactic acid level Slow to clear, likely in part due to renal disease.  Hypernatremia: In the setting of diuresis and poor by mouth intake, sodium becoming more concentrated. Had leveled off slightly today although overall, patient not improved   Transaminitis: Noted bilirubin trending upward, as transaminases trended downward. This is likely from previous shock liver. Right upper quadrant ultrasound unrevealing.  Acute on Chronic systolic heart failure: Following aggressive hydration, have started Lasix for diuresis  Elevated troponin more in the setting of hypotension? Sepsis and poor renal clearance.  History of chronic atrial fibrillation and complete heart block status post pacemaker. Not felt to be a candidate for anticoagulation secondary to risk of falls and cognitive impairment  Thrombocytopenia: Likely in the setting of sepsis Code StatusDO  NOT RESUSCITATE and comfort care    Family Communication: Spoke with granddaughter by phone   Disposition Plan: patient transition to comfort care. DO NOT RESUSCITATE status. Transfer to hospice facility-will initiate process on Monday   Consultants: Critical care   Procedures: None  Antibiotics:  Vancomycin:  4/11-4/16  Zosyn 4/11-4/16    Objective: BP 118/79 mmHg  Pulse 77  Temp(Src) 97.4 F (36.3 C) (Axillary)  Resp 18  Ht  (1.803 m)  Wt 57.8 kg (127 lb 6.8 oz)  BMI 17.78 kg/m2  SpO2 100%  Intake/Output Summary (Last 24 hours) at 08/26/14 1511 Last data filed at 08/26/14 0600  Gross per 24 hour  Intake 1091.67 ml  Output   2000 ml  Net -908.33 ml   Filed Weights   08/24/14 0446 08/25/14 0450 08/26/14 0526  Weight: 63.4 kg (139 lb 12.4 oz) 58.1 kg (128 lb 1.4 oz) 57.8 kg (127 lb 6.8 oz)    Exam: Little change, occasionally moans, mostly somnolent, minimally responsive, dry mouth   Cardiovascular: regular rate and rhythm, S1-S2, 2/6 systolic ejection murmur  Respiratory: Poor inspiratory effort, otherwise clear  Abdomen: Soft, nondistended, hypoactive bowel sounds  Musculoskeletal:  trace edema, Skin is smooth, poor circulation, suspect peripheral vascular disease as well as peripheral neuropathy  Data Reviewed: Basic Metabolic Panel:  Recent Labs Lab 08/20/14 2305 08/21/14 0431 08/22/14 0411 08/23/14 0526 08/23/14 1000 08/24/14 0523 08/26/14 0940  NA  --  142 145 146*  --  148* 143  K  --  5.2* 4.5 3.4*  --  3.1* 3.0*  CL  --  112 114* 115*  --  115* 105  CO2  --  21 18* 20  --  21 21  GLUCOSE  --  135* 143* 166* 161* 191* 115*  BUN  --  74* 78* 69*  --  64* 47*  CREATININE  --  2.50* 2.50* 2.22*  --  2.16* 2.09*  CALCIUM  --  8.0* 7.9* 8.2*  --  8.1* 8.2*  MG 2.1  --   --   --   --   --   --    Liver Function Tests:  Recent Labs Lab 08/20/14 1914 08/21/14 0431 08/22/14 0411 08/23/14 0526 08/24/14 0523  AST 135* 475* 422* 320* 231*  ALT 74* 234* 313* 310*  275*  ALKPHOS 89 79 79 79 79  BILITOT 2.3* 1.9* 1.6* 2.2* 2.1*  PROT 5.6* 5.4* 5.1* 5.3* 4.7*  ALBUMIN 3.0* 2.6* 2.5* 2.4* 2.3*   No results for input(s): LIPASE, AMYLASE in the last 168 hours.  Recent Labs Lab 08/21/14 0431  AMMONIA 29   CBC:  Recent Labs Lab 08/21/14 0431 08/22/14 0411 08/23/14 0526 08/24/14 0523 08/26/14 0940  WBC 9.7 10.2 8.9 8.0 7.4  NEUTROABS 8.6*  --   --   --   --   HGB 11.7* 12.2* 12.0* 11.7* 13.2  HCT 35.3* 36.9* 36.5* 35.4* 39.8  MCV 93.4 92.0 91.9 93.4 92.3  PLT 98* 78* 68* 62* 76*   Cardiac Enzymes:    Recent Labs Lab 08/20/14 1914 08/21/14 0431 08/21/14 1214 08/21/14 2333  CKTOTAL 614*  --  1486*  --   TROPONINI 0.06* 0.10* 0.15* 0.17*   BNP (last 3 results)  Recent Labs  07/15/14 1531  BNP 1049.7*    ProBNP (last 3 results)  Recent Labs  10/22/13 1600  PROBNP 3408.0*    CBG:  Recent Labs Lab 08/26/14 0138 08/26/14 0414 08/26/14 0510 08/26/14 0746 08/26/14  1156  GLUCAP 97 65* 149* 106* 88    Recent Results (from the past 240 hour(s))  Urine culture     Status: None   Collection Time: 08/21/14 12:39 AM  Result Value Ref Range Status   Specimen Description URINE, CATHETERIZED  Final   Special Requests NONE  Final   Colony Count NO GROWTH Performed at [facility]   Final   Culture NO GROWTH Performed at [facility]   Final   Report Status 08/22/2014 FINAL  Final  MRSA PCR Screening     Status: None   Collection Time: 08/21/14  1:13 AM  Result Value Ref Range Status   MRSA by PCR NEGATIVE NEGATIVE Final    Comment:        The GeneXpert MRSA Assay (FDA approved for NASAL specimens only), is one component of a comprehensive MRSA colonization surveillance program. It is not intended to diagnose MRSA infection nor to guide or monitor treatment for MRSA infections.   Blood culture (routine x 2)     Status: None (Preliminary result)   Collection Time: 08/21/14  2:20 AM  Result  Value Ref Range Status   Specimen Description BLOOD LEFT ARM  Final   Special Requests BOTTLES DRAWN AEROBIC ONLY 1CC  Final   Culture   Final           BLOOD CULTURE RECEIVED NO GROWTH TO DATE CULTURE WILL BE HELD FOR 5 DAYS BEFORE ISSUING A FINAL NEGATIVE REPORT Performed at [facility]    Report Status PENDING  Incomplete  Culture, blood (routine x 2)     Status: None (Preliminary result)   Collection Time: 08/21/14  4:19 AM  Result Value Ref Range Status   Specimen Description BLOOD LEFT HAND  Final   Special Requests BOTTLES DRAWN AEROBIC ONLY 3CC  Final   Culture   Final           BLOOD CULTURE RECEIVED NO GROWTH TO DATE CULTURE WILL BE HELD FOR 5 DAYS BEFORE ISSUING A FINAL NEGATIVE REPORT Performed at [facility]    Report Status PENDING  Incomplete     Studies: No results found.  Scheduled Meds: . furosemide  40 mg Intravenous BID  . sodium chloride  10-40 mL Intracatheter Q12H    Continuous Infusions: . dextrose 1,000 mL (08/26/14 0808)     Time spent: 15 minutes  Hollice Espy  [facility] Pager [phone]. If 7PM-7AM, please contact night-coverage at [email or web address], password [facility] 08/26/2014, 3:11 PM  LOS: 6 days

## 2014-08-27 LAB — CULTURE, BLOOD (ROUTINE X 2)
CULTURE: NO GROWTH
Culture: NO GROWTH

## 2014-08-27 LAB — BRAIN NATRIURETIC PEPTIDE: B Natriuretic Peptide: 1117.6 pg/mL — ABNORMAL HIGH (ref 0.0–100.0)

## 2014-08-27 LAB — GLUCOSE, CAPILLARY: Glucose-Capillary: 140 mg/dL — ABNORMAL HIGH (ref 70–99)

## 2014-08-27 MED ORDER — FUROSEMIDE 10 MG/ML IJ SOLN
20.0000 mg | Freq: Two times a day (BID) | INTRAMUSCULAR | Status: DC
  Administered 2014-08-27 (×2): 20 mg via INTRAVENOUS
  Filled 2014-08-27 (×4): qty 2

## 2014-08-27 MED ORDER — INSULIN ASPART 100 UNIT/ML ~~LOC~~ SOLN
0.0000 [IU] | Freq: Three times a day (TID) | SUBCUTANEOUS | Status: DC
  Administered 2014-08-27: 1 [IU] via SUBCUTANEOUS
  Administered 2014-08-29: 2 [IU] via SUBCUTANEOUS

## 2014-08-27 NOTE — Evaluation (Addendum)
Clinical/Bedside Swallow Evaluation Patient Details  Name: Charles Hendricks MRN: [id number] Date of Birth: [birth year]  Today's Date: 08/27/2014 Time: SLP Start Time (ACUTE ONLY): 1547 SLP Stop Time (ACUTE ONLY): 1610 SLP Time Calculation (min) (ACUTE ONLY): 23 min  Past Medical History:  Past Medical History  Diagnosis Date  . Bradycardia   . Chronic atrial fibrillation     Sick Sinus syndrome 1998  . Complete heart block     a. Initial placement 2005. s/p Medtronic pacemaker gen change 2013.  Marland Kitchen HTN (hypertension), benign   . Pacemaker-Medtronic   . Non-ischemic cardiomyopathy     last EF 35% by Echo at [facility] 2008  . Gout   . Elevated PSA     asymptomatic  . Corns and callosities   . Cognitive impairment     a. Taken off Coumadin due to this.  . Chronic combined systolic and diastolic CHF (congestive heart failure)   . CKD (chronic kidney disease), stage III    Past Surgical History:  Past Surgical History  Procedure Laterality Date  . Pacemaker insertion  1998    power source change 2005  . Permanent pacemaker generator change N/A 03/05/2012    Procedure: PERMANENT PACEMAKER GENERATOR CHANGE;  Surgeon: Marinus Maw, MD;  Location: [facility] CATH LAB;  Service: Cardiovascular;  Laterality: N/A;   HPI:  Pt is an 81 y/o male with PMH of nonischemic cardiomyopathy (prior admission 2015 35%), chronic atrial fibrillation, complete heartblock s/p pacemaker placement, HTN, CKD. Pt was found at home on the floor confused, hypothermic, hypoglycemic and hypotensive. In ER pt unable to follow commands; confused.  CXR revealed small R pleural effusion. CT revealed no acute intracranial abnormality. Stable chronic findings including remote left MCA distribution infarct.    Assessment / Plan / Recommendation Clinical Impression  Pt with significant improvement in mentation and swallow function this date demonstrating ability to follow commands and safely consume thin liquid and puree  consistencies. Pt eager to eat requesting food and liquids with noted ability to self feed. Observed increased distractibility during PO consumption including frequent talking concerning for toleration of solid consistencies. No overt signs or symptoms of aspiration with thin liquid or puree consistencies. Recommend Dysphagia 1 and thin liquid diet with full supervision for all POs. Medicines crushed with puree. Continue skilled swallow intervention for diet toleration and trials of upgraded POs if mentation continues to improve.     Aspiration Risk  Mild    Diet Recommendation Dysphagia 1 (Puree);Thin liquid   Liquid Administration via: Straw;Cup Medication Administration: Crushed with puree Supervision: Full supervision/cueing for compensatory strategies Compensations: Slow rate;Small sips/bites Postural Changes and/or Swallow Maneuvers: Seated upright 90 degrees    Other  Recommendations Oral Care Recommendations: Oral care BID   Follow Up Recommendations       Frequency and Duration min 2x/week  2 weeks   Pertinent Vitals/Pain     SLP Swallow Goals     Swallow Study Prior Functional Status       General HPI: Pt is an 81 y/o male with PMH of nonischemic cardiomyopathy (prior admission 2015 35%), chronic atrial fibrillation, complete heartblock s/p pacemaker placement, HTN, CKD. Pt was found at home on the floor confused, hypothermic, hypoglycemic and hypotensive. In ER pt unable to follow commands; confused.  CXR revealed small R pleural effusion. CT revealed no acute intracranial abnormality. Stable chronic findings including remote left MCA distribution infarct.  Type of Study: Bedside swallow evaluation Diet Prior to this Study: NPO Temperature Spikes  Noted: No Respiratory Status: Nasal cannula History of Recent Intubation: No Behavior/Cognition: Alert;Cooperative;Distractible;Pleasant mood Oral Cavity - Dentition: Edentulous Self-Feeding Abilities: Able to feed  self;Needs set up Patient Positioning: Upright in bed Baseline Vocal Quality: Clear Volitional Cough: Strong    Oral/Motor/Sensory Function Overall Oral Motor/Sensory Function: Appears within functional limits for tasks assessed Labial ROM: Within Functional Limits Labial Symmetry: Within Functional Limits Labial Strength: Within Functional Limits Labial Sensation: Within Functional Limits Lingual ROM: Within Functional Limits Lingual Strength: Within Functional Limits Facial ROM: Within Functional Limits Facial Symmetry: Within Functional Limits Facial Strength: Within Functional Limits   Ice Chips Ice chips: Within functional limits Presentation: Spoon   Thin Liquid Thin Liquid: Within functional limits Presentation: Cup;Straw    Nectar Thick Nectar Thick Liquid: Not tested   Honey Thick Honey Thick Liquid: Not tested   Puree Puree: Within functional limits Presentation: Self Fed;Spoon   Solid   GO    Solid: Impaired Presentation: Self Fed Oral Phase Impairments: Reduced lingual movement/coordination Other Comments: distractible, frequent talking during eating, delaying swallow      Marcene Duos MA, CCC-SLP Acute Care Speech Language Pathologist    Kennieth Rad 08/27/2014,4:18 PM

## 2014-08-27 NOTE — Social Work (Signed)
CSW contacted granddaughter Garth Bigness [phone]. Grand daughter stated that she was interested in hospice care at a facility in [place] because that is where his sister and church family are and they would like to visit. CSW faxed referral information to [facility] in [place] and grand daughter is agreeable.   CSW will continue to follow.  Beverly Sessions MSW, LCSW [phone]

## 2014-08-27 NOTE — Progress Notes (Signed)
PROGRESS NOTE  Charles Hendricks ZOX:[id number] DOB: 11-19-1924 DOA: 08/20/2014 PCP: Pearla Dubonnet, MD  HPI/Recap of past 24 hours: Patient is an 81 year old male past history nonischemic cardiomyopathy and ejection fraction of 35%, chronic atrial fibrillation status post pacemaker after complete heart block as well as chronic kidney disease who was admitted on 4/10 after being found on the floor confused. Initial CT scan of head unremarkable. Patient noted to be hypothermic, hypoglycemic and hypotensive with elevated lactic acid levels.  Patient's cause of encephalopathy has been unclear. Unable to get MRI due to pacemaker. Repeat CT scan done also unrevealing. Patient waxes and wanes with his mentation. He has not been fully alert enough to start a diet. Critical care consulted. Source of questionable infection not found. Patient has been hydrated.   Little improvement from previous day. Patient started looking slightly volume overloaded so started on Lasix on 4/13 evening. In that time he has diuresed almost 3 L. Renal function has remained stable. However he has not changed mentation and when stopping D5, he becomes hypoglycemic. Unfortunately in after several days patient has had little improvement mentation and cannot stay awake or alert enough to eat. 2 much fluid hydration has led to volume overload requiring Lasix, which causes hypernatremia worsening. Patient unable to give me any subjective complaints   After discussion with the patient's granddaughter, who is the patient's healthcare power of attorney, she and her sister have opted to make the patient comfort care and have changed him to a DO NOT RESUSCITATE status on 4/16.  Of his own accord, patient is much more awake and alert today, 4/17. He is demanding something to drink.  Assessment/Plan: Principal Problem:   Acute encephalopathy: Patient still waxes and wanes. Mentation is still not strong enough for diet. Unclear underlying  etiology. Repeat CT scan unrevealing EEG unrevealing. TSH and cortisol within normal limits. Ammonia level normal, blood gas unrevealing.  Questionable dementia progression. Patient is markedly different today and more alert. Have requested speech therapy to see.  Active Problems:   AKI (acute kidney injury) in the setting of stage III chronic kidney disease: Slow to improve. Likely in part from poor by mouth intake as well as initial sepsis and hypotension. Improving actually with Lasix continue Lasix for comfort, but no further following labs as he is now comfort care.     Hypoglycemia: Initially Question accuracy. It was noted that when checking blood sugars from his right versus left hand, there was a discrepancy. The serum blood glucose actually corresponded better with the non-hypoglycemic level. suspect patient likely has peripheral vascular disease which may be referring with results. However with stopping D5, patient still get hypoglycemic with results from "good hand". We'll continue with D5W, but will stop checking blood sugars.  If this mentation improves, and able to eat, we'll restart CBG checks    Hypothermia: Resolved.   Sepsis/ Lactic acidosis: Source of infection unclear, so cannot truly define this as sepsis. Discontinue antibiotics on 4/16 Lactic acid level Slow to clear, likely in part due to renal disease.  Hypernatremia: In the setting of diuresis and poor by mouth intake, sodium becoming more concentrated. Had leveled off slightly today although overall, patient not improved   Transaminitis: Noted bilirubin trending upward, as transaminases trended downward. This is likely from previous shock liver. Right upper quadrant ultrasound unrevealing.  Acute on Chronic systolic heart failure: Following aggressive hydration, have started Lasix for diuresis  Elevated troponin more in the setting of hypotension? Sepsis and poor  renal clearance.  History of chronic atrial fibrillation  and complete heart block status post pacemaker. Not felt to be a candidate for anticoagulation secondary to risk of falls and cognitive impairment  Thrombocytopenia: Likely in the setting of sepsis Code StatusDO NOT RESUSCITATE and comfort care   Family Communication: Spoke with granddaughter by phone   Disposition Plan: Patient was changed over to comfort care. However with change in mentation, will need to see what his swallowing ability does.   Consultants: Critical care   Procedures: None  Antibiotics:  Vancomycin:  4/11-4/16  Zosyn 4/11-4/16    Objective: BP 125/75 mmHg  Pulse 91  Temp(Src) 97.9 F (36.6 C) (Oral)  Resp 16  Ht 5\' 11"  (1.803 m)  Wt 57.8 kg (127 lb 6.8 oz)  BMI 17.78 kg/m2  SpO2 100%  Intake/Output Summary (Last 24 hours) at 08/27/14 1120 Last data filed at 08/27/14 0802  Gross per 24 hour  Intake 560.83 ml  Output   2200 ml  Net -1639.17 ml   Filed Weights   08/24/14 0446 08/25/14 0450 08/26/14 0526  Weight: 63.4 kg (139 lb 12.4 oz) 58.1 kg (128 lb 1.4 oz) 57.8 kg (127 lb 6.8 oz)    Exam: General, awake and alert. Interactive. Still confused  Cardiovascular: regular rate and rhythm, S1-S2, 2/6 systolic ejection murmur  Respiratory: Better inspiratory effort, clear to auscultation bilaterally  Abdomen: Soft, nondistended, hypoactive bowel sounds  Musculoskeletal:  trace edema, Skin is smooth, poor circulation, suspect peripheral vascular disease as well as peripheral neuropathy  Data Reviewed: Basic Metabolic Panel:  Recent Labs Lab 08/20/14 2305 08/21/14 0431 08/22/14 0411 08/23/14 0526 08/23/14 1000 08/24/14 0523 08/26/14 0940  NA  --  142 145 146*  --  148* 143  K  --  5.2* 4.5 3.4*  --  3.1* 3.0*  CL  --  112 114* 115*  --  115* 105  CO2  --  21 18* 20  --  21 21  GLUCOSE  --  135* 143* 166* 161* 191* 115*  BUN  --  74* 78* 69*  --  64* 47*  CREATININE  --  2.50* 2.50* 2.22*  --  2.16* 2.09*  CALCIUM  --  8.0* 7.9*  8.2*  --  8.1* 8.2*  MG 2.1  --   --   --   --   --   --    Liver Function Tests:  Recent Labs Lab 08/20/14 1914 08/21/14 0431 08/22/14 0411 08/23/14 0526 08/24/14 0523  AST 135* 475* 422* 320* 231*  ALT 74* 234* 313* 310* 275*  ALKPHOS 89 79 79 79 79  BILITOT 2.3* 1.9* 1.6* 2.2* 2.1*  PROT 5.6* 5.4* 5.1* 5.3* 4.7*  ALBUMIN 3.0* 2.6* 2.5* 2.4* 2.3*   No results for input(s): LIPASE, AMYLASE in the last 168 hours.  Recent Labs Lab 08/21/14 0431  AMMONIA 29   CBC:  Recent Labs Lab 08/21/14 0431 08/22/14 0411 08/23/14 0526 08/24/14 0523 08/26/14 0940  WBC 9.7 10.2 8.9 8.0 7.4  NEUTROABS 8.6*  --   --   --   --   HGB 11.7* 12.2* 12.0* 11.7* 13.2  HCT 35.3* 36.9* 36.5* 35.4* 39.8  MCV 93.4 92.0 91.9 93.4 92.3  PLT 98* 78* 68* 62* 76*   Cardiac Enzymes:    Recent Labs Lab 08/20/14 1914 08/21/14 0431 08/21/14 1214 08/21/14 2333  CKTOTAL 614*  --  1486*  --   TROPONINI 0.06* 0.10* 0.15* 0.17*   BNP (last 3  results)  Recent Labs  07/15/14 1531  BNP 1049.7*    ProBNP (last 3 results)  Recent Labs  10/22/13 1600  PROBNP 3408.0*    CBG:  Recent Labs Lab 08/26/14 0746 08/26/14 1156 08/26/14 1740 08/26/14 1744 08/26/14 1814  GLUCAP 106* 88 <10* 21* 191*    Recent Results (from the past 240 hour(s))  Urine culture     Status: None   Collection Time: 08/21/14 12:39 AM  Result Value Ref Range Status   Specimen Description URINE, CATHETERIZED  Final   Special Requests NONE  Final   Colony Count NO GROWTH Performed at [facility]   Final   Culture NO GROWTH Performed at [facility]   Final   Report Status 08/22/2014 FINAL  Final  MRSA PCR Screening     Status: None   Collection Time: 08/21/14  1:13 AM  Result Value Ref Range Status   MRSA by PCR NEGATIVE NEGATIVE Final    Comment:        The GeneXpert MRSA Assay (FDA approved for NASAL specimens only), is one component of a comprehensive MRSA  colonization surveillance program. It is not intended to diagnose MRSA infection nor to guide or monitor treatment for MRSA infections.   Blood culture (routine x 2)     Status: None   Collection Time: 08/21/14  2:20 AM  Result Value Ref Range Status   Specimen Description BLOOD LEFT ARM  Final   Special Requests BOTTLES DRAWN AEROBIC ONLY 1CC  Final   Culture   Final    NO GROWTH 5 DAYS Performed at [facility]    Report Status 08/27/2014 FINAL  Final  Culture, blood (routine x 2)     Status: None (Preliminary result)   Collection Time: 08/21/14  4:19 AM  Result Value Ref Range Status   Specimen Description BLOOD LEFT HAND  Final   Special Requests BOTTLES DRAWN AEROBIC ONLY 3CC  Final   Culture   Final           BLOOD CULTURE RECEIVED NO GROWTH TO DATE CULTURE WILL BE HELD FOR 5 DAYS BEFORE ISSUING A FINAL NEGATIVE REPORT Performed at [facility]    Report Status PENDING  Incomplete     Studies: No results found.  Scheduled Meds: . furosemide  20 mg Intravenous BID  . sodium chloride  10-40 mL Intracatheter Q12H    Continuous Infusions: . dextrose 50 mL/hr at 08/27/14 0553     Time spent: 25 minutes  Hollice Espy  [facility] Pager [phone]. If 7PM-7AM, please contact night-coverage at [email or web address], password [facility] 08/27/2014, 11:20 AM  LOS: 7 days

## 2014-08-28 DIAGNOSIS — F0390 Unspecified dementia without behavioral disturbance: Secondary | ICD-10-CM

## 2014-08-28 DIAGNOSIS — N183 Chronic kidney disease, stage 3 (moderate): Secondary | ICD-10-CM

## 2014-08-28 LAB — CBC
HEMATOCRIT: 37.3 % — AB (ref 39.0–52.0)
HEMOGLOBIN: 12.4 g/dL — AB (ref 13.0–17.0)
MCH: 30.5 pg (ref 26.0–34.0)
MCHC: 33.2 g/dL (ref 30.0–36.0)
MCV: 91.6 fL (ref 78.0–100.0)
Platelets: 102 10*3/uL — ABNORMAL LOW (ref 150–400)
RBC: 4.07 MIL/uL — AB (ref 4.22–5.81)
RDW: 16.4 % — ABNORMAL HIGH (ref 11.5–15.5)
WBC: 9.9 10*3/uL (ref 4.0–10.5)

## 2014-08-28 LAB — BASIC METABOLIC PANEL
Anion gap: 14 (ref 5–15)
BUN: 44 mg/dL — AB (ref 6–23)
CO2: 29 mmol/L (ref 19–32)
Calcium: 7.7 mg/dL — ABNORMAL LOW (ref 8.4–10.5)
Chloride: 97 mmol/L (ref 96–112)
Creatinine, Ser: 2.1 mg/dL — ABNORMAL HIGH (ref 0.50–1.35)
GFR calc Af Amer: 31 mL/min — ABNORMAL LOW (ref >90)
GFR, EST NON AFRICAN AMERICAN: 26 mL/min — AB (ref >90)
Glucose, Bld: 137 mg/dL — ABNORMAL HIGH (ref 70–99)
POTASSIUM: 2.6 mmol/L — AB (ref 3.5–5.1)
Sodium: 140 mmol/L (ref 135–145)

## 2014-08-28 LAB — GLUCOSE, CAPILLARY
GLUCOSE-CAPILLARY: 92 mg/dL (ref 70–99)
GLUCOSE-CAPILLARY: 129 mg/dL — AB (ref 70–99)
GLUCOSE-CAPILLARY: 111 mg/dL — AB (ref 70–99)
GLUCOSE-CAPILLARY: 156 mg/dL — AB (ref 70–99)
Glucose-Capillary: 130 mg/dL — ABNORMAL HIGH (ref 70–99)
Glucose-Capillary: 155 mg/dL — ABNORMAL HIGH (ref 70–99)

## 2014-08-28 MED ORDER — ENSURE ENLIVE PO LIQD
237.0000 mL | Freq: Two times a day (BID) | ORAL | Status: DC
  Administered 2014-08-29 – 2014-08-30 (×4): 237 mL via ORAL

## 2014-08-28 MED ORDER — POTASSIUM CHLORIDE 10 MEQ/100ML IV SOLN
10.0000 meq | INTRAVENOUS | Status: AC
  Administered 2014-08-28 (×6): 10 meq via INTRAVENOUS
  Filled 2014-08-28 (×6): qty 100

## 2014-08-28 MED ORDER — FUROSEMIDE 10 MG/ML IJ SOLN
40.0000 mg | Freq: Two times a day (BID) | INTRAMUSCULAR | Status: DC
  Administered 2014-08-28 – 2014-08-29 (×4): 40 mg via INTRAVENOUS
  Filled 2014-08-28 (×5): qty 4

## 2014-08-28 NOTE — Progress Notes (Signed)
Orders received for potassium of 2.6

## 2014-08-28 NOTE — Clinical Social Work Note (Signed)
Daughter is requesting long term SNF placement through the [place]. CSW has submitted referral to the [facility]. Will hopefully get response tomorrow. Delaney Meigs is agreeable to patient going to [facility] if bed is available.   Roddie Mc MSW, [place], [place], [phone]

## 2014-08-28 NOTE — Progress Notes (Signed)
INITIAL NUTRITION ASSESSMENT  DOCUMENTATION CODES Per approved criteria  -Severe malnutrition in the context of chronic illness   Pt meets criteria for severe MALNUTRITION in the context of chronic illness as evidenced by severe fat and muscle depletion.  INTERVENTION: Ensure Enlive po BID, each supplement provides 350 kcal and 20 grams of protein  NUTRITION DIAGNOSIS: Malnutriton related to inadequate oral intake as evidenced by severe fat and muscle depletion.   Goal: Pt will meet >90% of estimated nutritional needs  Monitor:  PO/supplement intake, labs, weight changes, I/O's  Reason for Assessment: Consult to assess needs  81 y.o. male  Admitting Dx: Acute encephalopathy  Patient is an 81 year old male past history nonischemic cardiomyopathy and ejection fraction of 35%, chronic atrial fibrillation status post pacemaker after complete heart block as well as chronic kidney disease who was admitted on 4/10 after being found on the floor confused. Initial CT scan of head unremarkable. Patient noted to be hypothermic, hypoglycemic and hypotensive with elevated lactic acid levels. Patient's cause of encephalopathy has been unclear. Unable to get MRI due to pacemaker. Repeat CT scan done also unrevealing. Patient's mentation waxing and waning, but mostly poor and he has not been awake and alert enough to get a swallow evaluation level on eat, Critical care consulted. Source of questionable infection not found. Patient on IV fluids for sepsis and hydration  ASSESSMENT: Pt transitioned off comfort care on 08/27/14, due to becoming awake, alert, and interactive. SLP evaluated for BSE and recommended a dysphagia 1 diet with thin liquids.  Pt very talkative during visit, but unable to provide a reliable hx due to cognitive deficit. No family at bedside. Pt reports he is hungry and awaiting his dinner tray. Noted pt with no teeth. He denies trouble swallowing liquids today.  Chart review  reveals UBW around 120#. Suspect chronically poor PO intake.  CSW following. Plan is for SNF placement. Labs reviewed. K: 2.6, BUN/Creat: 44/2.46, Calcium: 7.7, Glucose: 137. CBGS: 92-130.   Nutrition Focused Physical Exam:  Subcutaneous Fat:  Orbital Region: severe depletion Upper Arm Region: severe depletion Thoracic and Lumbar Region: severe depletion  Muscle:  Temple Region: severe depletion Clavicle Bone Region: severe depletion Clavicle and Acromion Bone Region: severe depletion Scapular Bone Region: severe depletion Dorsal Hand: severe depletion Patellar Region: severe depletion Anterior Thigh Region: severe depletion Posterior Calf Region: severe depletion  Edema: none present  Height: Ht Readings from Last 1 Encounters:  08/21/14 5\' 11"  (1.803 m)    Weight: Wt Readings from Last 1 Encounters:  08/26/14 127 lb 6.8 oz (57.8 kg)    Ideal Body Weight: 172#  % Ideal Body Weight: 74%  Wt Readings from Last 10 Encounters:  08/26/14 127 lb 6.8 oz (57.8 kg)  07/26/14 121 lb (54.885 kg)  07/19/14 119 lb 6.4 oz (54.159 kg)  06/12/14 120 lb (54.432 kg)  10/31/13 118 lb (53.524 kg)  10/22/13 129 lb (58.514 kg)  08/31/13 124 lb (56.246 kg)  06/07/13 121 lb (54.885 kg)  04/30/13 129 lb (58.514 kg)  03/05/12 119 lb (53.978 kg)    Usual Body Weight: 120#  % Usual Body Weight: 106%  BMI:  Body mass index is 17.78 kg/(m^2). Underweight  Estimated Nutritional Needs: Kcal: 1400-1600 Protein: 65-75 grams Fluid: 1.4-1.6 L  Skin: stage I pressure ulcer on rt hip  Diet Order: DIET - DYS 1 Room service appropriate?: Yes; Fluid consistency:: Thin  EDUCATION NEEDS: -Education not appropriate at this time   Intake/Output Summary (Last 24 hours) at  08/28/14 1613 Last data filed at 08/28/14 1322  Gross per 24 hour  Intake 2922.17 ml  Output   1500 ml  Net 1422.17 ml    Last BM: 08/28/14  Labs:   Recent Labs Lab 08/24/14 0523 08/26/14 0940 08/28/14 0420   NA 148* 143 140  K 3.1* 3.0* 2.6*  CL 115* 105 97  CO2 BUN 64* 47* 44*  CREATININE 2.16* 2.09* 2.10*  CALCIUM 8.1* 8.2* 7.7*  GLUCOSE 191* 115* 137*    CBG (last 3)   Recent Labs  08/28/14 0100 08/28/14 0811 08/28/14 1155  GLUCAP 130* 92 129*    Scheduled Meds: . furosemide  40 mg Intravenous BID  . insulin aspart  0-9 Units Subcutaneous TID WC  . sodium chloride  10-40 mL Intracatheter Q12H    Continuous Infusions: . dextrose 500 mL (08/28/14 0744)    Past Medical History  Diagnosis Date  . Bradycardia   . Chronic atrial fibrillation     Sick Sinus syndrome 1998  . Complete heart block     a. Initial placement 2005. s/p Medtronic pacemaker gen change 2013.  Marland Kitchen HTN (hypertension), benign   . Pacemaker-Medtronic   . Non-ischemic cardiomyopathy     last EF 35% by Echo at [facility] 2008  . Gout   . Elevated PSA     asymptomatic  . Corns and callosities   . Cognitive impairment     a. Taken off Coumadin due to this.  . Chronic combined systolic and diastolic CHF (congestive heart failure)   . CKD (chronic kidney disease), stage III     Past Surgical History  Procedure Laterality Date  . Pacemaker insertion  1998    power source change 2005  . Permanent pacemaker generator change N/A 03/05/2012    Procedure: PERMANENT PACEMAKER GENERATOR CHANGE;  Surgeon: Marinus Maw, MD;  Location: [facility] CATH LAB;  Service: Cardiovascular;  Laterality: N/A;    Madalee Altmann A. Mayford Knife, RD, LDN, CDE Pager: [phone] After hours Pager: [phone]

## 2014-08-28 NOTE — Progress Notes (Signed)
PROGRESS NOTE  Charles Hendricks ZOX:[id number] DOB: 09-19-1924 DOA: 08/20/2014 PCP: Pearla Dubonnet, MD  HPI/Recap of past 24 hours: Patient is an 81 year old male past history nonischemic cardiomyopathy and ejection fraction of 35%, chronic atrial fibrillation status post pacemaker after complete heart block as well as chronic kidney disease who was admitted on 4/10 after being found on the floor confused. Initial CT scan of head unremarkable. Patient noted to be hypothermic, hypoglycemic and hypotensive with elevated lactic acid levels.  Patient's cause of encephalopathy has been unclear. Unable to get MRI due to pacemaker. Repeat CT scan done also unrevealing. Patient's mentation waxing and waning, but mostly poor and he has not been awake and alert enough to get a swallow evaluation level on eat,  Critical care consulted. Source of questionable infection not found. Patient on IV fluids for sepsis and hydration  Patient started looking slightly volume overloaded so started on Lasix on 4/13 evening. In that time he has diureseover 4 L initially.  Renal function has remained stable. However he has not changed mentation and when stopping D5,  became hypoglycemic.  this status continued to persist with patient being unable to stay awake for by mouth and getting hypoglycemic when IV stopped and IV fluids would only cause volume overload.  After discussion with the patient's granddaughter, who is the patient's healthcare power of attorney, she and her sister have opted to make the patient comfort care and have changed him to a DO NOT RESUSCITATE status on 4/16.    However of his own accord, patient became awake and alert and interactive as of 4/17. He requested something to drink so speech therapy able to perform swallow eval putting him that dysphagia 1 diet with thin liquids. He has been tolerating this and remains awake. Still having some oxygen desaturations to Lasix restarted.  today, patient  interactive. He is alert nor to 2. He has told me explicitly that he does not want to stay anymore, despite the fact that he is still hypoxic and needs to be further diuresed as well as has been pretty much bed bound for over a week. He denies any complaints  Assessment/Plan: Principal Problem:   Acute encephalopathyIn the setting of mild dementia with secondary dysphagia :  underlying insult may have been sepsis. Looks to be resolved and patient back to his baseline of alert and oriented 2. Repeat CT scan unrevealing EEG unrevealing. TSH and cortisol within normal limits. Ammonia level normal, blood gas unrevealing.Seen by speech therapy and now on dysphagia 1 with thin liquid diet   Active Problems:   AKI (acute kidney injury) in the setting of stage III chronic kidney disease: Slow to improve. Likely in part from poor by mouth intake as well as initial sepsis and hypotension. Continues to improve and now around 2.1.  Hypokalemia: Secondary to diuresis, replacing potassium accordingly    Hypoglycemia: Initially Question accuracy. It was noted that when checking blood sugars from his right versus left hand, there was a discrepancy. The serum blood glucose actually corresponded better with the non-hypoglycemic level. suspect patient likely has peripheral vascular disease which may be referring with results. However with stopping D5, patient still get hypoglycemic with results from "good hand". have stop D5 not he is taking by mouth. Very very gentle sliding scale.    Hypothermia: Resolved.   Sepsis/ Lactic acidosis: Source of infection unclear, so cannot truly define this as sepsis. Discontinue antibiotics on 4/16, seven-day course completed  Lactic acid level Slow to clear,  likely in part due to renal disease.  Hypernatremia: In the setting of diuresis and poor by mouth intake, sodium becoming more concentrated. now resolved   Transaminitis: Noted bilirubin trending upward, as transaminases  trended downward. This is likely from previous shock liver. Right upper quadrant ultrasound unrevealing.  Acute on Chronic systolic heart failure: Following aggressive hydration, have started Lasix for diuresis  Elevated troponin more in the setting of hypotension? Sepsis and poor renal clearance.Follow-up BNP on evening of 4/17 at 1118   History of chronic atrial fibrillation and complete heart block status post pacemaker. Not felt to be a candidate for anticoagulation secondary to risk of falls and cognitive impairment  Thrombocytopenia: Likely in the setting of sepsis, continues to improve and is over 100 today  Code Status: Remains DO NOT RESUSCITATE, Comfort Care revoked   Family Communication: Updated granddaughter by phone, who was pleased to hear patient's improvement, but agrees that he is not yet ready to be discharged. Needs to go to skilled nursing   Disposition Plan: discharged to skilled nursing once fully diuresed   Consultants: Critical care   Procedures: None  Antibiotics:  Vancomycin:  4/11-4/16  Zosyn 4/11-4/16    Objective: BP 122/74 mmHg  Pulse 96  Temp(Src) 97.6 F (36.4 C) (Oral)  Resp 20  Ht  (1.803 m)  Wt 57.8 kg (127 lb 6.8 oz)  BMI 17.78 kg/m2  SpO2 81%  Intake/Output Summary (Last 24 hours) at 08/28/14 1408 Last data filed at 08/28/14 1322  Gross per 24 hour  Intake 2922.17 ml  Output   1500 ml  Net 1422.17 ml   Filed Weights   08/24/14 0446 08/25/14 0450 08/26/14 0526  Weight: 63.4 kg (139 lb 12.4 oz) 58.1 kg (128 lb 1.4 oz) 57.8 kg (127 lb 6.8 oz)    Exam:  Gen.: Alert and oriented 2, interactive  Cardiology: Regular rate and rhythm, S1-S2   Respiratory: Better inspiratory effort, clear to auscultation bilaterally  Abdomen: Soft, nondistended, hypoactive bowel sounds  Musculoskeletal:  trace edema, Skin is smooth, poor circulation, suspect peripheral vascular disease as well as peripheral neuropathy  Data  Reviewed: Basic Metabolic Panel:  Recent Labs Lab 08/22/14 0411 08/23/14 0526 08/23/14 1000 08/24/14 0523 08/26/14 0940 08/28/14 0420  NA 145 146*  --  148* 143 140  K 4.5 3.4*  --  3.1* 3.0* 2.6*  CL 114* 115*  --  115* 105 97  CO2 18* 20  --  GLUCOSE 143* 166* 161* 191* 115* 137*  BUN 78* 69*  --  64* 47* 44*  CREATININE 2.50* 2.22*  --  2.16* 2.09* 2.10*  CALCIUM 7.9* 8.2*  --  8.1* 8.2* 7.7*   Liver Function Tests:  Recent Labs Lab 08/22/14 0411 08/23/14 0526 08/24/14 0523  AST 422* 320* 231*  ALT 313* 310* 275*  ALKPHOS 79 79 79  BILITOT 1.6* 2.2* 2.1*  PROT 5.1* 5.3* 4.7*  ALBUMIN 2.5* 2.4* 2.3*   No results for input(s): LIPASE, AMYLASE in the last 168 hours. No results for input(s): AMMONIA in the last 168 hours. CBC:  Recent Labs Lab 08/22/14 0411 08/23/14 0526 08/24/14 0523 08/26/14 0940 08/28/14 0420  WBC 10.2 8.9 8.0 7.4 9.9  HGB 12.2* 12.0* 11.7* 13.2 12.4*  HCT 36.9* 36.5* 35.4* 39.8 37.3*  MCV 92.0 91.9 93.4 92.3 91.6  PLT 78* 68* 62* 76* 102*   Cardiac Enzymes:    Recent Labs Lab 08/21/14 2333  TROPONINI 0.17*   BNP (  last 3 results)  Recent Labs  07/15/14 1531 08/27/14 1807  BNP 1049.7* 1117.6*    ProBNP (last 3 results)  Recent Labs  10/22/13 1600  PROBNP 3408.0*    CBG:  Recent Labs Lab 08/26/14 1814 08/27/14 1732 08/28/14 0100 08/28/14 0811 08/28/14 1155  GLUCAP 191* 140* 130* 92 129*    Recent Results (from the past 240 hour(s))  Urine culture     Status: None   Collection Time: 08/21/14 12:39 AM  Result Value Ref Range Status   Specimen Description URINE, CATHETERIZED  Final   Special Requests NONE  Final   Colony Count NO GROWTH Performed at [facility]   Final   Culture NO GROWTH Performed at [facility]   Final   Report Status 08/22/2014 FINAL  Final  MRSA PCR Screening     Status: None   Collection Time: 08/21/14  1:13 AM  Result Value Ref Range Status   MRSA by  PCR NEGATIVE NEGATIVE Final    Comment:        The GeneXpert MRSA Assay (FDA approved for NASAL specimens only), is one component of a comprehensive MRSA colonization surveillance program. It is not intended to diagnose MRSA infection nor to guide or monitor treatment for MRSA infections.   Blood culture (routine x 2)     Status: None   Collection Time: 08/21/14  2:20 AM  Result Value Ref Range Status   Specimen Description BLOOD LEFT ARM  Final   Special Requests BOTTLES DRAWN AEROBIC ONLY 1CC  Final   Culture   Final    NO GROWTH 5 DAYS Performed at [facility]    Report Status 08/27/2014 FINAL  Final  Culture, blood (routine x 2)     Status: None   Collection Time: 08/21/14  4:19 AM  Result Value Ref Range Status   Specimen Description BLOOD LEFT HAND  Final   Special Requests BOTTLES DRAWN AEROBIC ONLY 3CC  Final   Culture   Final    NO GROWTH 5 DAYS Performed at [facility]    Report Status 08/27/2014 FINAL  Final     Studies: No results found.  Scheduled Meds: . furosemide  40 mg Intravenous BID  . insulin aspart  0-9 Units Subcutaneous TID WC  . potassium chloride  10 mEq Intravenous Q1 Hr x 6  . sodium chloride  10-40 mL Intracatheter Q12H    Continuous Infusions: . dextrose 500 mL (08/28/14 0744)     Time spent: 25 minutes  Hollice Espy  [facility] Pager [phone]. If 7PM-7AM, please contact night-coverage at [email or web address], password [facility] 08/28/2014, 2:08 PM  LOS: 8 days

## 2014-08-28 NOTE — Progress Notes (Signed)
CRITICAL VALUE ALERT  Critical value received: potassium 2.6  Date of notification: 08/28/14   Time of notification:  0518  Critical value read back: yes  Nurse who received alert:  Darl Householder  MD notified (1st page):  [facility]  Time of first page:  0519  MD notified (2nd page): [facility]  Time of second [id number]  Responding MD:  No response so far  Time MD responded:

## 2014-08-29 LAB — BASIC METABOLIC PANEL
Anion gap: 15 (ref 5–15)
BUN: 45 mg/dL — AB (ref 6–23)
CO2: 31 mmol/L (ref 19–32)
Calcium: 7.5 mg/dL — ABNORMAL LOW (ref 8.4–10.5)
Chloride: 96 mmol/L (ref 96–112)
Creatinine, Ser: 1.9 mg/dL — ABNORMAL HIGH (ref 0.50–1.35)
GFR calc Af Amer: 35 mL/min — ABNORMAL LOW (ref >90)
GFR, EST NON AFRICAN AMERICAN: 30 mL/min — AB (ref >90)
GLUCOSE: 125 mg/dL — AB (ref 70–99)
Potassium: 3 mmol/L — ABNORMAL LOW (ref 3.5–5.1)
SODIUM: 142 mmol/L (ref 135–145)

## 2014-08-29 LAB — GLUCOSE, CAPILLARY
GLUCOSE-CAPILLARY: 82 mg/dL (ref 70–99)
GLUCOSE-CAPILLARY: 129 mg/dL — AB (ref 70–99)
Glucose-Capillary: 99 mg/dL (ref 70–99)
Glucose-Capillary: 155 mg/dL — ABNORMAL HIGH (ref 70–99)

## 2014-08-29 LAB — MAGNESIUM: MAGNESIUM: 1.2 mg/dL — AB (ref 1.5–2.5)

## 2014-08-29 NOTE — Progress Notes (Signed)
Pharmacist Heart Failure Core Measure Documentation  Assessment: Charles Hendricks has an EF documented as 25-30% on 07/16/2014 by [facility].  Rationale: Heart failure patients with left ventricular systolic dysfunction (LVSD) and an EF < 40% should be prescribed an angiotensin converting enzyme inhibitor (ACEI) or angiotensin receptor blocker (ARB) at discharge unless a contraindication is documented in the medical record.  This patient is not currently on an ACEI or ARB for HF.  This note is being placed in the record in order to provide documentation that a contraindication to the use of these agents is present for this encounter.  ACE Inhibitor or Angiotensin Receptor Blocker is contraindicated (specify all that apply)  []   ACEI allergy AND ARB allergy []   Angioedema []   Moderate or severe aortic stenosis []   Hyperkalemia []   Hypotension []   Renal artery stenosis [x]   Worsening renal function, preexisting renal disease or dysfunction  Pt. Is on lisinopril 5mg  PTA, has been holding since admission.  Charles Hendricks 08/29/2014 3:09 PM

## 2014-08-29 NOTE — Progress Notes (Signed)
Physical Therapy Treatment Patient Details Name: Reynoldo Rolfe MRN: [id number] DOB: 08-30-1924 Today's Date: 08/29/2014    History of Present Illness Pt is an 81 y/o male with PMH of nonischemic cardiomyopathy (prior admission 2015 35%), chronic atrial fibrillation, complete heartblock s/p pacemaker placement, HTN, CKD. Pt was found at home on the floor confused, hypothermic, hypoglycemic and hypotensive. In ER pt unable to follow commands; confused. CXR revealed small R pleural effusion. CT revealed no acute intracranial abnormality. Stable chronic findings including remote left MCA distribution infarct.      PT Comments    Progressing slowly.  Reluctant to participate at any time and needs encouragement to get OOB.  Pain in ?R foot/LE seems to be an important limiting factor.   Follow Up Recommendations  SNF;Supervision/Assistance - 24 hour     Equipment Recommendations  Other (comment)    Recommendations for Other Services       Precautions / Restrictions Precautions Precautions: Fall Restrictions Weight Bearing Restrictions: No    Mobility  Bed Mobility Overal bed mobility: Needs Assistance Bed Mobility: Supine to Sit     Supine to sit: Max assist;+2 for safety/equipment     General bed mobility comments: cues for sequencing and assist to come up and scoot to EOB  Transfers Overall transfer level: Needs assistance Equipment used: Rolling walker (2 wheeled) Transfers: Sit to/from [employer] Sit to Stand: Mod assist;+2 safety/equipment Stand pivot transfers: Mod assist;+2 safety/equipment       General transfer comment: pt needed consistent redirection and cues for guidance, assist to come forward and stand..  Pivot rather antalgic in nature.  Ambulation/Gait             General Gait Details: unable today   Stairs            Wheelchair Mobility    Modified Rankin (Stroke Patients Only) Modified Rankin (Stroke Patients  Only) Pre-Morbid Rankin Score: Moderate disability Modified Rankin: Severe disability     Balance Overall balance assessment: Needs assistance Sitting-balance support: No upper extremity supported Sitting balance-Leahy Scale: Poor Sitting balance - Comments: tends to list posteriorly or need UE assist to maintain balance at EOB   Standing balance support: Bilateral upper extremity supported Standing balance-Leahy Scale: Poor Standing balance comment: stood in RW statically still trying to favor his R LE                    Cognition Arousal/Alertness: Awake/alert Behavior During Therapy: WFL for tasks assessed/performed Overall Cognitive Status: No family/caregiver present to determine baseline cognitive functioning (but very confused at this time.) Area of Impairment: Orientation;Safety/judgement;Following commands Orientation Level: Place;Time;Situation Current Attention Level: Focused   Following Commands: Follows one step commands inconsistently;Follows one step commands with increased time     Problem Solving: Slow processing;Decreased initiation;Difficulty sequencing      Exercises      General Comments General comments (skin integrity, edema, etc.): Unable to get a sat reading via pulse ox on RA due to cold fingers.      Pertinent Vitals/Pain Pain Assessment: Faces Faces Pain Scale: Hurts even more Pain Location: R foot pain Pain Descriptors / Indicators: Grimacing Pain Intervention(s): Monitored during session;Repositioned    Home Living                      Prior Function            PT Goals (current goals can now be found in the care plan section) Acute  Rehab PT Goals Patient Stated Goal: unable to state PT Goal Formulation: Patient unable to participate in goal setting Time For Goal Achievement: 09/05/14 Potential to Achieve Goals: Fair Progress towards PT goals: Progressing toward goals    Frequency  Min 2X/week    PT Plan  Current plan remains appropriate    Co-evaluation             End of Session Equipment Utilized During Treatment: Oxygen Activity Tolerance: Patient tolerated treatment well;Patient limited by pain Patient left: in chair;with call bell/phone within reach;with chair alarm set     Time: 1610-9604 PT Time Calculation (min) (ACUTE ONLY): 30 min  Charges:  $Therapeutic Activity: 23-37 mins                    G Codes:      Lillion Elbert, Eliseo Gum 08/29/2014, 4:13 PM  08/29/2014  [place] Bing, PT [phone]  (pager)

## 2014-08-29 NOTE — Progress Notes (Addendum)
Occupational Therapy Treatment Patient Details Name: Charles Hendricks MRN: [id number] DOB: 1924-06-25 Today's Date: 08/29/2014    History of present illness Pt is an 81 y/o male with PMH of nonischemic cardiomyopathy (prior admission 2015 35%), chronic atrial fibrillation, complete heartblock s/p pacemaker placement, HTN, CKD. Pt was found at home on the floor confused, hypothermic, hypoglycemic and hypotensive. In ER pt unable to follow commands; confused. CXR revealed small R pleural effusion. CT revealed no acute intracranial abnormality. Stable chronic findings including remote left MCA distribution infarct.     OT comments  Pt with much improved ability to follow basic commands and attend to self care task from bed level. He declined OOB at this time but did participate well in grooming tasks requested. Will follow on acute.     Follow Up Recommendations  SNF    Equipment Recommendations  None recommended by OT    Recommendations for Other Services      Precautions / Restrictions Precautions Precautions: Fall Restrictions Weight Bearing Restrictions: No       Mobility Bed Mobility pt declined OOB                  Transfers-pt declined OOB                      Balance                                   ADL       Grooming: Wash/dry face;Brushing hair;Sitting;Bed level;Set up                                 General ADL Comments: Pt just finishing up breakfast and did perform basic grooming tasks sitting up HOB raised. He did much better than at eval with attention to the task and only required setup of washcloth to wash his face and comb to comb hair and he followed through independently. Pt not oriented to location and thought he is at the drug store. Reoriented pt to current location. Encouraged pt to work on OOB with OT after grooming task but pt declined OOB right now stating, "I just ate breakfast. I want to let it digest  and rest." Reattempted to have pt even try EOB with OT but pt declined again.       Vision                     Perception     Praxis      Cognition   Behavior During Therapy: [facility] for tasks assessed/performed Overall Cognitive Status: No family/caregiver present to determine baseline cognitive functioning                       Extremity/Trunk Assessment               Exercises     Shoulder Instructions       General Comments      Pertinent Vitals/ Pain       Pain Assessment: No/denies pain Pain Score: 0-No pain  Home Living                                          Prior Functioning/Environment  Frequency Min 2X/week     Progress Toward Goals  OT Goals(current goals can now be found in the care plan section)  Progress towards OT goals: Progressing toward goals     Plan Discharge plan remains appropriate    Co-evaluation                 End of Session     Activity Tolerance Patient tolerated treatment well   Patient Left in bed;with call bell/phone within reach   Nurse Communication          Time: 0950-1003 OT Time Calculation (min): 13 min  Charges: OT General Charges $OT Visit: 1 Procedure OT Treatments $Self Care/Home Management : 8-22 mins  Lennox Laity  [phone] 08/29/2014, 11:30 AM

## 2014-08-29 NOTE — Progress Notes (Signed)
Utilization review complete 

## 2014-08-29 NOTE — Progress Notes (Signed)
Speech Language Pathology Treatment:    Patient Details Name: Charles Hendricks MRN: [id number] DOB: June 20, 1924 Today's Date: 08/29/2014 Time: 0848-     Assessment / Plan / Recommendation Clinical Impression  Pt seen during am meals; he is alert and able to self feed with assist for set up. Pt consumed puree and thin liquids without any signs of significant oral or oropharyngeal difficulty or signs of aspiration. Pt able to masticate regular textures solids despite missing dentition. Will upgrade diet to dys 3 (mechanical soft) and thin liquids in order to facilitate self feeding and conserve energy. No SLP f/u needed, will sign off.    HPI HPI: Pt is an 81 y/o male with PMH of nonischemic cardiomyopathy (prior admission 2015 35%), chronic atrial fibrillation, complete heartblock s/p pacemaker placement, HTN, CKD. Pt was found at home on the floor confused, hypothermic, hypoglycemic and hypotensive. In ER pt unable to follow commands; confused.  CXR revealed small R pleural effusion. CT revealed no acute intracranial abnormality. Stable chronic findings including remote left MCA distribution infarct.    Pertinent Vitals Pain Score: 0-No pain  SLP Plan  All goals met    Recommendations Diet recommendations: Dysphagia 3 (mechanical soft);Thin liquid Liquids provided via: Cup;Straw Medication Administration: Whole meds with puree Supervision: Full supervision/cueing for compensatory strategies Compensations: Slow rate;Small sips/bites Postural Changes and/or Swallow Maneuvers: Seated upright 90 degrees              Oral Care Recommendations: Oral care BID Follow up Recommendations: Skilled Nursing facility Plan: All goals met    GO    Herbie [place], MA CCC-SLP [phone]  Lynann Beaver 08/29/2014, 8:54 AM

## 2014-08-29 NOTE — Progress Notes (Signed)
[facility]                                                                              Patient Demographics  Charles Hendricks, is a 81 y.o. male, DOB - 1924/06/21, ZOX:[id number]  Admit date - 08/20/2014   Admitting Physician Eduard Clos, MD  Outpatient Primary MD for the patient is Pearla Dubonnet, MD  LOS - 9   Chief Complaint  Patient presents with  . Fall  . Altered Mental Status       Brief HPI   The patient is a 81 year old male with nonischemic cardiomyopathy, EF 35%, chronic atrial fibrillation status post pacemaker after complete heart block, chronic kidney disease, admitted on 4/10 after being found on the floor confused.Initial CT scan of head unremarkable. Patient noted to be hypothermic, hypoglycemic and hypotensive with elevated lactic acid levels. Patient was admitted for further workup.    Assessment & Plan    Principal Problem:  Acute encephalopathy- improving likely worsened due to sepsis, lactic acidosis, in the setting of mild dementia -Patient close to his baseline alert and oriented 1, still somewhat confused -  Repeat CT scan unrevealing EEG unrevealing. TSH and cortisol within normal limits. Ammonia level normal, blood gas unrevealing. - Placed on dysphagia 3 diet  Stage III chronic kidney disease: Slow to improve. Likely in part from poor PO intake, dehydration, sepsis and hypotension.  - Continues to improve and stable around 2.1  Hypokalemia: Secondary to diuresis - Chek BMET   Hypoglycemia: - Continue sliding scale insulin    Hypothermia: Resolved.  Sepsis/ Lactic acidosis: Source of infection unclear, so cannot truly define this as sepsis.  - Discontinued antibiotics on 4/16, seven-day course completed   Hypernatremia: improving, In the setting of diuresis and poor by mouth intake   Transaminitis: Noted bilirubin trending upward, as transaminases trended downward. This is likely from previous shock  liver. Right upper quadrant ultrasound unrevealing.  Acute on Chronic systolic heart failure:  -Continue IV Lasix ,Elevated troponin more in the setting of hypotension? Sepsis and poor renal clearance  History of chronic atrial fibrillation and complete heart block status post pacemaker. Not felt to be a candidate for anticoagulation secondary to risk of falls and cognitive impairment  Thrombocytopenia: Likely in the setting of sepsis, continues to improve   Code Status: dnr  Family Communication: Discussed in detail with the patient, all imaging results, lab results explained to the patient    Disposition Plan: dc to SNF in am  Time Spent in minutes   25 minutes  Procedures  none  Consults   none  DVT Prophylaxis  SCD's  Medications  Scheduled Meds: . feeding supplement (ENSURE ENLIVE)  237 mL Oral BID BM  . furosemide  40 mg Intravenous BID  . insulin aspart  0-9 Units Subcutaneous TID WC  . sodium chloride  10-40 mL Intracatheter Q12H   Continuous Infusions: . dextrose 500 mL (08/28/14 0744)   PRN Meds:.albuterol, morphine injection, sodium chloride   Antibiotics   Anti-infectives    Start     Dose/Rate Route Frequency Ordered Stop   08/22/14 0600  vancomycin (VANCOCIN) 500  mg in sodium chloride 0.9 % 100 mL IVPB  Status:  Discontinued     500 mg 100 mL/hr over 60 Minutes Intravenous Every 24 hours 08/21/14 1336 08/26/14 1510   08/21/14 1200  piperacillin-tazobactam (ZOSYN) IVPB 2.25 g  Status:  Discontinued     2.25 g 100 mL/hr over 30 Minutes Intravenous Every 8 hours 08/21/14 0057 08/26/14 1510   08/21/14 0100  piperacillin-tazobactam (ZOSYN) IVPB 3.375 g  Status:  Discontinued     3.375 g 100 mL/hr over 30 Minutes Intravenous  Once 08/21/14 0052 08/21/14 0055   08/21/14 0100  vancomycin (VANCOCIN) IVPB 1000 mg/200 mL premix     1,000 mg 200 mL/hr over 60 Minutes Intravenous  Once 08/21/14 0052 08/21/14 0545   08/21/14 0100  ceFEPIme (MAXIPIME) 1 g in  dextrose 5 % 50 mL IVPB  Status:  Discontinued     1 g 100 mL/hr over 30 Minutes Intravenous  Once 08/21/14 0054 08/21/14 0057   08/21/14 0100  piperacillin-tazobactam (ZOSYN) IVPB 2.25 g     2.25 g 100 mL/hr over 30 Minutes Intravenous  Once 08/21/14 0057 08/21/14 1610        Subjective:   Charles Hendricks was seen and examined today. alert and oriented to self, states yes and no to questions.  Denies SOB abdominal pain, N/V/D/C, new weakness, numbess, tingling. No acute events overnight.    Objective:   Blood pressure 124/86, pulse 96, temperature 97.9 F (36.6 C), temperature source Oral, resp. rate 18, height  (1.803 m), weight 57.8 kg (127 lb 6.8 oz), SpO2 90 %.  Wt Readings from Last 3 Encounters:  08/26/14 57.8 kg (127 lb 6.8 oz)  07/26/14 54.885 kg (121 lb)  07/19/14 54.159 kg (119 lb 6.4 oz)     Intake/Output Summary (Last 24 hours) at 08/29/14 1359 Last data filed at 08/29/14 1100  Gross per 24 hour  Intake    600 ml  Output   1801 ml  Net  -1201 ml    Exam  General: Alert and oriented x 1, NAD  HEENT:  PERRLA, EOMI, Anicteic Sclera, mucous membranes moist.   Neck: Supple, no JVD, no masses  CVS: S1 S2 auscultated, no rubs, murmurs or gallops. Regular rate and rhythm.  Respiratory: Clear to auscultation bilaterally, no wheezing, rales or rhonchi  Abdomen: Soft, nontender, nondistended, + bowel sounds  Ext: no cyanosis clubbing, trace edema  Neuro: AAOx3, Cr N's II- XII. Strength 5/5 upper and lower extremities bilaterally  Skin: No rashes  Psych: Normal affect and demeanor, alert and oriented x1   Data Review   Micro Results Recent Results (from the past 240 hour(s))  Urine culture     Status: None   Collection Time: 08/21/14 12:39 AM  Result Value Ref Range Status   Specimen Description URINE, CATHETERIZED  Final   Special Requests NONE  Final   Colony Count NO GROWTH Performed at [facility]   Final   Culture NO  GROWTH Performed at [facility]   Final   Report Status 08/22/2014 FINAL  Final  MRSA PCR Screening     Status: None   Collection Time: 08/21/14  1:13 AM  Result Value Ref Range Status   MRSA by PCR NEGATIVE NEGATIVE Final    Comment:        The GeneXpert MRSA Assay (FDA approved for NASAL specimens only), is one component of a comprehensive MRSA colonization surveillance program. It is not intended to diagnose MRSA infection  nor to guide or monitor treatment for MRSA infections.   Blood culture (routine x 2)     Status: None   Collection Time: 08/21/14  2:20 AM  Result Value Ref Range Status   Specimen Description BLOOD LEFT ARM  Final   Special Requests BOTTLES DRAWN AEROBIC ONLY 1CC  Final   Culture   Final    NO GROWTH 5 DAYS Performed at [facility]    Report Status 08/27/2014 FINAL  Final  Culture, blood (routine x 2)     Status: None   Collection Time: 08/21/14  4:19 AM  Result Value Ref Range Status   Specimen Description BLOOD LEFT HAND  Final   Special Requests BOTTLES DRAWN AEROBIC ONLY 3CC  Final   Culture   Final    NO GROWTH 5 DAYS Performed at [facility]    Report Status 08/27/2014 FINAL  Final    Radiology Reports Dg Chest 2 View  08/20/2014   CLINICAL DATA:  Found on floor in bathroom, lower extremity edema.  EXAM: CHEST  2 VIEW  COMPARISON:  07/15/2014  FINDINGS: Single lead left-sided pacemaker in place. The heart is enlarged, likely progressed from prior exam. Small right pleural effusion, slightly decreased in degree from prior exam. Probable tiny left pleural effusion. Decreased vascular congestion. No pneumothorax. No confluent airspace disease.  IMPRESSION: Decreased right pleural effusion and vascular congestion, however probable slight increase in cardiomegaly.   Electronically Signed   By: Rubye Oaks M.D.   On: 08/20/2014 23:21   Ct Head Wo Contrast  08/24/2014   CLINICAL DATA:  Follow-up encephalopathy.  Headache. Question stroke.  EXAM: CT HEAD WITHOUT CONTRAST  TECHNIQUE: Contiguous axial images were obtained from the base of the skull through the vertex without intravenous contrast.  COMPARISON:  Head CT 08/20/2014  FINDINGS: No intracranial hemorrhage. Stable atrophy and chronic small vessel ischemia. Stable remote left MCA distribution infarct. No signs of acute or subacute ischemia. No cerebral edema. Basal gangliar calcifications again seen. No subdural or extra-axial collection. Mild inflammatory change but paranasal sinuses, stable. No acute bony abnormality.  IMPRESSION: 1. No acute intracranial abnormality. No signs of acute or subacute ischemia. 2. Stable chronic change.   Electronically Signed   By: Rubye Oaks M.D.   On: 08/24/2014 22:14   Ct Head Wo Contrast  08/20/2014   CLINICAL DATA:  81 year old male with confusion after fall.  EXAM: CT HEAD WITHOUT CONTRAST  CT CERVICAL SPINE WITHOUT CONTRAST  TECHNIQUE: Multidetector CT imaging of the head and cervical spine was performed following the standard protocol without intravenous contrast. Multiplanar CT image reconstructions of the cervical spine were also generated.  COMPARISON:  Head CT 11/09/2013  FINDINGS: CT HEAD FINDINGS  Patient had difficulty tolerating the exam, moderate patient motion artifact. No intracranial hemorrhage, mass effect, or midline shift. No hydrocephalus. The basilar cisterns are patent. No evidence of acute infarct. 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 Unchanged atrophy and chronic small vessel ischemic change. No intracranial fluid collection. Atherosclerosis noted of the skullbase vasculature. Calvarium is intact. There is mild diffuse paranasal sinus mucosal thickening. The mastoid air cells are well aerated.  CT CERVICAL SPINE FINDINGS  Cervical spine alignment is maintained. Vertebral body heights are preserved. There is no fracture. The dens is intact. There are no jumped or perched facets.  There is multilevel degenerative disc disease most significant at C6-C7. Multilevel facet arthropathy. No prevertebral soft tissue edema.  IMPRESSION: 1. No acute  intracranial abnormality. Stable chronic findings including remote left MCA distribution infarct. 2. Degenerative change in the cervical spine without acute fracture or subluxation.   Electronically Signed   By: Rubye Oaks M.D.   On: 08/20/2014 22:11   Ct Cervical Spine Wo Contrast  08/20/2014   CLINICAL DATA:  81 year old male with confusion after fall.  EXAM: CT HEAD WITHOUT CONTRAST  CT CERVICAL SPINE WITHOUT CONTRAST  TECHNIQUE: Multidetector CT imaging of the head and cervical spine was performed following the standard protocol without intravenous contrast. Multiplanar CT image reconstructions of the cervical spine were also generated.  COMPARISON:  Head CT 11/09/2013  FINDINGS: CT HEAD FINDINGS  Patient had difficulty tolerating the exam, moderate patient motion artifact. No intracranial hemorrhage, mass effect, or midline shift. No hydrocephalus. The basilar cisterns are patent. No evidence of acute infarct. 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 Unchanged atrophy and chronic small vessel ischemic change. No intracranial fluid collection. Atherosclerosis noted of the skullbase vasculature. Calvarium is intact. There is mild diffuse paranasal sinus mucosal thickening. The mastoid air cells are well aerated.  CT CERVICAL SPINE FINDINGS  Cervical spine alignment is maintained. Vertebral body heights are preserved. There is no fracture. The dens is intact. There are no jumped or perched facets. There is multilevel degenerative disc disease most significant at C6-C7. Multilevel facet arthropathy. No prevertebral soft tissue edema.  IMPRESSION: 1. No acute intracranial abnormality. Stable chronic findings including remote left MCA distribution infarct. 2. Degenerative change in the cervical spine without acute fracture or  subluxation.   Electronically Signed   By: Rubye Oaks M.D.   On: 08/20/2014 22:11   Dg Chest Port 1 View  08/23/2014   CLINICAL DATA:  One day history of wheezing. Atrial fibrillation, chronic  EXAM: PORTABLE CHEST - 1 VIEW  COMPARISON:  August 22, 2014.  FINDINGS: There is a small pleural effusion with bibasilar atelectasis, stable. There is new mild left perihilar edema. No consolidation. There is generalized cardiomegaly with pulmonary vascularity within normal limits. Pacemaker lead is attached to the right ventricle. Central catheter tip is in the superior cava. No pneumothorax. There is calcification in both carotid arteries.  IMPRESSION: Stable cardiomegaly. Small right effusion with bibasilar atelectatic change. New asymmetric interstitial prominence in the left perihilar region. Question early edema. There may be a degree of congestive heart failure.   Electronically Signed   By: Bretta Bang III M.D.   On: 08/23/2014 20:53   Dg Chest Port 1 View  08/22/2014   CLINICAL DATA:  Central line placement  EXAM: PORTABLE CHEST - 1 VIEW  COMPARISON:  08/21/2014  FINDINGS: Right PICC line has been placed with the tip in the lower SVC. Left pacer is unchanged. There is cardiomegaly. Small right pleural effusion is unchanged. Bibasilar atelectasis or infiltrates.  IMPRESSION: Right PICC line tip in the lower SVC.  Bibasilar atelectasis or infiltrates.  Small right effusion.  Stable cardiomegaly.   Electronically Signed   By: Charlett Nose M.D.   On: 08/22/2014 20:06   Dg Chest Port 1 View  08/21/2014   CLINICAL DATA:  Pneumonia and shortness of breath  EXAM: PORTABLE CHEST - 1 VIEW  COMPARISON:  08/20/2014  FINDINGS: Cardiopericardial enlargement is unchanged from yesterday. Stable aortic and hilar contours. A single chamber pacer lead from the left is in stable position when accounting for differences in technique. Stable small right pleural effusion with underlying interstitial coarsening. Pleural  thickening along the lateral right chest wall  is also unchanged. No definitive pneumonia. No edema or pneumothorax.  IMPRESSION: Stable small right pleural effusion.   Electronically Signed   By: Marnee Spring M.D.   On: 08/21/2014 06:48   US Abdomen Limited Ruq  08/22/2014   CLINICAL DATA:  81 year old male with abnormal LFTs. History of hypertension. Initial encounter.  EXAM: US ABDOMEN LIMITED - RIGHT UPPER QUADRANT  COMPARISON:  None.  FINDINGS: Gallbladder:  No gallstones or gallbladder wall thickening. The patient was not able to respond to stimulation therefore not able to determine if the patient were tender over this region.  Common bile duct:  Diameter: 3.2 cm.  Liver:  Fatty infiltration liver suspected. Additionally, enlargement of the hepatic veins and inferior vena cava out consistent with increased right heart pressure. Within the right lobe liver there is a septated 1.4 cm cyst.  Small amount of ascites.  IMPRESSION: No gallstones. No ultrasound evidence of cholecystitis. Patient was not able to respond to stimulation to determine if there was tenderness over this region.  Increased right heart pressures suspected with increased size of the inferior vena cava and hepatic veins.  Small amount of ascites.  Fatty infiltration of the liver.  1.4 cm septated cyst right lobe of the liver.   Electronically Signed   By: Lacy Duverney M.D.   On: 08/22/2014 14:59    CBC  Recent Labs Lab 08/23/14 0526 08/24/14 0523 08/26/14 0940 08/28/14 0420  WBC 8.9 8.0 7.4 9.9  HGB 12.0* 11.7* 13.2 12.4*  HCT 36.5* 35.4* 39.8 37.3*  PLT 68* 62* 76* 102*  MCV 91.9 93.4 92.3 91.6  MCH 30.2 30.9 30.6 30.5  MCHC 32.9 33.1 33.2 33.2  RDW 16.4* 16.7* 16.7* 16.4*    Chemistries   Recent Labs Lab 08/23/14 0526 08/23/14 1000 08/24/14 0523 08/26/14 0940 08/28/14 0420  NA 146*  --  148* 143 140  K 3.4*  --  3.1* 3.0* 2.6*  CL 115*  --  115* 105 97  CO2 20  --  21 21 29   GLUCOSE 166* 161* 191* 115*  137*  BUN 69*  --  64* 47* 44*  CREATININE 2.22*  --  2.16* 2.09* 2.10*  CALCIUM 8.2*  --  8.1* 8.2* 7.7*  AST 320*  --  231*  --   --   ALT 310*  --  275*  --   --   ALKPHOS 79  --  79  --   --   BILITOT 2.2*  --  2.1*  --   --    ------------------------------------------------------------------------------------------------------------------ estimated creatinine clearance is 19.9 mL/min (by C-G formula based on Cr of 2.1). ------------------------------------------------------------------------------------------------------------------ No results for input(s): HGBA1C in the last 72 hours. ------------------------------------------------------------------------------------------------------------------ No results for input(s): CHOL, HDL, LDLCALC, TRIG, CHOLHDL, LDLDIRECT in the last 72 hours. ------------------------------------------------------------------------------------------------------------------ No results for input(s): TSH, T4TOTAL, T3FREE, THYROIDAB in the last 72 hours.  Invalid input(s): FREET3 ------------------------------------------------------------------------------------------------------------------ No results for input(s): VITAMINB12, FOLATE, FERRITIN, TIBC, IRON, RETICCTPCT in the last 72 hours.  Coagulation profile No results for input(s): INR, PROTIME in the last 168 hours.  No results for input(s): DDIMER in the last 72 hours.  Cardiac Enzymes No results for input(s): CKMB, TROPONINI, MYOGLOBIN in the last 168 hours.  Invalid input(s): CK ------------------------------------------------------------------------------------------------------------------ Invalid input(s): POCBNP   Recent Labs  08/28/14 1155 08/28/14 1705 08/28/14 2203 08/28/14 2254 08/29/14 0833 08/29/14 1156  GLUCAP 129* 111* 156* 155* 99 155*     Ritesh Opara M.D. [facility] 08/29/2014, 1:59 PM  Pager:  [phone]   Between 7am to 7pm - call Pager -  [phone]  After 7pm go to [email or web address] - password [email or web address]  Call night coverage person covering after 7pm

## 2014-08-29 NOTE — Clinical Social Work Note (Signed)
CSW was informed by [facility] that patient does not have VA SNF benefits. Charles Hendricks has chosen [facility] and will use patient's [facility] MEDICARE for SNF. Family will start long term Medicaid application this week. CSW has updated [facility].  Roddie Mc MSW, [place], [place], [phone]

## 2014-08-30 ENCOUNTER — Encounter: Payer: Self-pay | Admitting: Internal Medicine

## 2014-08-30 LAB — BASIC METABOLIC PANEL
Anion gap: 12 (ref 5–15)
BUN: 43 mg/dL — ABNORMAL HIGH (ref 6–23)
CHLORIDE: 95 mmol/L — AB (ref 96–112)
CO2: 34 mmol/L — ABNORMAL HIGH (ref 19–32)
CREATININE: 1.75 mg/dL — AB (ref 0.50–1.35)
Calcium: 7.3 mg/dL — ABNORMAL LOW (ref 8.4–10.5)
GFR calc Af Amer: 38 mL/min — ABNORMAL LOW (ref >90)
GFR calc non Af Amer: 33 mL/min — ABNORMAL LOW (ref >90)
Glucose, Bld: 128 mg/dL — ABNORMAL HIGH (ref 70–99)
Potassium: 2.4 mmol/L — CL (ref 3.5–5.1)
Sodium: 141 mmol/L (ref 135–145)

## 2014-08-30 LAB — GLUCOSE, CAPILLARY
GLUCOSE-CAPILLARY: 119 mg/dL — AB (ref 70–99)
Glucose-Capillary: 104 mg/dL — ABNORMAL HIGH (ref 70–99)
Glucose-Capillary: 106 mg/dL — ABNORMAL HIGH (ref 70–99)

## 2014-08-30 LAB — MAGNESIUM: MAGNESIUM: 1.1 mg/dL — AB (ref 1.5–2.5)

## 2014-08-30 LAB — POTASSIUM: POTASSIUM: 3.8 mmol/L (ref 3.5–5.1)

## 2014-08-30 MED ORDER — POTASSIUM CHLORIDE CRYS ER 20 MEQ PO TBCR
40.0000 meq | EXTENDED_RELEASE_TABLET | ORAL | Status: AC
  Administered 2014-08-30 (×2): 40 meq via ORAL
  Filled 2014-08-30 (×2): qty 2

## 2014-08-30 MED ORDER — FUROSEMIDE 40 MG PO TABS
40.0000 mg | ORAL_TABLET | Freq: Two times a day (BID) | ORAL | Status: DC
  Administered 2014-08-30 (×2): 40 mg via ORAL
  Filled 2014-08-30 (×3): qty 1

## 2014-08-30 MED ORDER — MAGNESIUM OXIDE 400 (241.3 MG) MG PO TABS
800.0000 mg | ORAL_TABLET | Freq: Once | ORAL | Status: AC
  Administered 2014-08-30: 800 mg via ORAL
  Filled 2014-08-30: qty 2

## 2014-08-30 MED ORDER — MAGNESIUM OXIDE 400 MG PO CAPS: 1.0000 | ORAL_CAPSULE | Freq: Two times a day (BID) | ORAL | Status: AC

## 2014-08-30 MED ORDER — FUROSEMIDE 40 MG PO TABS: 40.0000 mg | ORAL_TABLET | Freq: Two times a day (BID) | ORAL | Status: DC

## 2014-08-30 MED ORDER — POTASSIUM CHLORIDE 10 MEQ/100ML IV SOLN
10.0000 meq | INTRAVENOUS | Status: AC
  Administered 2014-08-30 (×4): 10 meq via INTRAVENOUS
  Filled 2014-08-30 (×4): qty 100

## 2014-08-30 NOTE — Progress Notes (Signed)
Spoke with MD Rai to confirm PICC line status, order put in to d/c PICC, foley to be dc'd and voiding trial to be done at [facility] per MD Rai.

## 2014-08-30 NOTE — Clinical Social Work Note (Signed)
Clinical Social Worker facilitated patient discharge including contacting patient family and facility to confirm patient discharge plans.  Clinical information faxed to facility and family agreeable with plan.  CSW arranged ambulance transport via [facility] to [facility].  RN to call report prior to discharge.  Clinical Social Worker will sign off for now as social work intervention is no longer needed. Please consult us again if new need arises.  Jesse Sameka Bagent, LCSW [phone] 

## 2014-08-30 NOTE — Progress Notes (Signed)
CRITICAL VALUE ALERT  Critical value received:  K+=2.4  Date of notification:  08/30/14  Time of notification:  0619  Critical value read back:Yes.    Nurse who received alert:  Nelson Chimes  MD notified (1st page):  Dr.Rai  Time of first page:  0700  MD notified (2nd page):  Time of second page:  Responding MD:  Dr.Rai  Time MD responded:  0730

## 2014-08-30 NOTE — Progress Notes (Addendum)
NURSING PROGRESS NOTE  Charles Hendricks [id number] Discharge Data: 08/30/2014 6:49 PM Attending Provider: No att. providers found FTD:DUKGU,RKYHCW NEVILL, MD   Antony Blackbird to be D/C'd Skilled nursing facility per MD order. All paperwork given to EMS personal. Patient dc'd with foley per MD order. PICC removed.    All IV's will be discontinued and monitored for bleeding.  All belongings will be returned to patient for patient to take home.  Last Documented Vital Signs:  Blood pressure 122/71, pulse 92, temperature 98 F (36.7 C), temperature source Axillary, resp. rate 18, height 5\' 11"  (1.803 m), weight 57.8 kg (127 lb 6.8 oz), SpO2 97 %.  Leane Platt RN, BS, BSN

## 2014-08-30 NOTE — Progress Notes (Addendum)
Report called and received Rutherford Guys LPN [employer], all questions answered to her satisfaction. She verbalized understanding patient is to have foley removed and voiding trials done at [facility], and PICC dressing is not to be removed for 24 hours. All this information can be found in the d/c paperwork which Kevan Rosebush is aware of.

## 2014-08-30 NOTE — Discharge Summary (Addendum)
Physician Discharge Summary   Patient ID: Charles Hendricks MRN: [id number] DOB/AGE: Aug 08, 1924 81 y.o.  Admit date: 08/20/2014 Discharge date: 08/30/2014  Primary Care Physician:  Pearla Dubonnet, MD  Discharge Diagnoses:    . AKI (acute kidney injury) . Acute encephalopathy . Hypoglycemia . Hypothermia . Lactic acidosis . Chronic systolic congestive heart failure . CKD stage G3b/A1, GFR 30 - 44 and albumin creatinine ratio <30 mg/g Hypokalemia, hypomagnesemia  Consults: Critical care   Recommendations for Outpatient Follow-up:  Please check  BMET, magnesium level on Friday 09/01/14 and adjust replacement doses  Please DC foley tomorrow and assess for voiding trial  TESTS THAT NEED FOLLOW-UP BMET   DIET: Dysphagia 3 diet with thin liquids    Allergies:   Allergies  Allergen Reactions  . Asa [Aspirin] Other (See Comments)    Told not to take med-per MD     Discharge Medications:   Medication List    STOP taking these medications        lisinopril 5 MG tablet  Commonly known as:  PRINIVIL,ZESTRIL      TAKE these medications        furosemide 40 MG tablet  Commonly known as:  LASIX  Take 1 tablet (40 mg total) by mouth 2 (two) times daily. 1 tablet early a.m. and one tablet midafternoon.     Magnesium Oxide 400 MG Caps  Take 1 capsule (400 mg total) by mouth 2 (two) times daily.     metoprolol succinate 50 MG 24 hr tablet  Commonly known as:  TOPROL-XL  Take 1 tablet (50 mg total) by mouth daily. Take with or immediately following a meal.     potassium chloride SA 20 MEQ tablet  Commonly known as:  K-DUR,KLOR-CON  Take 1 tablet (20 mEq total) by mouth daily.         Brief H and P: For complete details please refer to admission H and P, but in brief The patient is a 81 year old male with nonischemic cardiomyopathy, EF 35%, chronic atrial fibrillation status post pacemaker after complete heart block, chronic kidney disease, admitted on 4/10  after being found on the floor confused.Initial CT scan of head unremarkable. Patient noted to be hypothermic, hypoglycemic and hypotensive with elevated lactic acid levels. Patient was admitted for further workup.  Hospital Course:  Patient is an 81 year old male past history nonischemic cardiomyopathy and ejection fraction of 35%, chronic atrial fibrillation status post pacemaker after complete heart block as well as chronic kidney disease who was admitted on 4/10 after being found on the floor confused. Initial CT scan of head unremarkable. Patient noted to be hypothermic, hypoglycemic and hypotensive with elevated lactic acid levels. Patient's cause of encephalopathy has been unclear. Unable to get MRI due to pacemaker. Repeat CT scan done also unrevealing. Patient's mentation waxing and waning, but mostly poor and he has not been awake and alert enough to get a swallow evaluation level on eat, Critical care consulted. Source of questionable infection was not found. Patient was placed on IV fluids for sepsis and hydration Patient started developing volume overload and hence Lasix was started on 4/13. He initially diuresed over 4 L, renal function remained stable. However his mentation did not improve and when stopping the D5, patient became hypoglycemic.  After discussion with the patient's granddaughter, who is the patient's healthcare power of attorney, she and her sister have opted to make the patient comfort care and have changed him to a DO NOT RESUSCITATE status on 4/16.  Patient however became more alert and awake and interactive as of 4/17. Speech therapy was able to perform swallow evaluation and was placed on dysphagia 1 diet with thin liquids. Patient is now much more interactive, alert and awake and has been working with physical therapy. PT recommended skilled nursing facility.   Acute encephalopathy- improving likely worsened due to sepsis, lactic acidosis, in the setting of mild  dementia Patient close to his baseline alert and oriented x2, still somewhat confused. Repeat CT scan unrevealing EEG unrevealing. TSH and cortisol within normal limits. Ammonia level normal, blood gas unrevealing. Patient has been improving with continuous physical therapy, recommended skilled nursing facility. He has been able to work with speech therapy and currently on dysphagia 3 diet and tolerating.   Stage III chronic kidney disease:  continues to improve. Likely in part from poor PO intake, dehydration, sepsis and hypotension.  continues to improve. Currently 1.75 at the time of discharge.   Hypokalemia: Secondary to diuresis Earlier today,  potassium was 2.4, patient received 40 mEq of K Dur and IV potassium replacement, potassium at the time of discharge is 3.8, patient also placed on oral magnesium replacement 400 mg BID.  Please check BMET, magnesium on Friday 4/22.    Hypoglycemia: - Continue sliding scale insulin    Hypothermia: Resolved.  Sepsis/ Lactic acidosis: Source of infection unclear, so cannot truly define this as sepsis.  - Discontinued antibiotics on 4/16, seven-day course completed   Hypernatremia: improving, In the setting of diuresis and poor by mouth intake  Transaminitis: Noted bilirubin trending upward, as transaminases trended downward. This is likely from previous shock liver. Right upper quadrant ultrasound unrevealing.  Acute on Chronic systolic heart failure:  -Continue IV Lasix ,Elevated troponin more in the setting of hypotension? Sepsis and poor renal clearance  History of chronic atrial fibrillation and complete heart block status post pacemaker. Not felt to be a candidate for anticoagulation secondary to risk of falls and cognitive impairment  Thrombocytopenia: Likely in the setting of sepsis, continues to improve   Day of Discharge BP 122/50 mmHg  Pulse 94  Temp(Src) 98.1 F (36.7 C) (Oral)  Resp 18  Ht 5\' 11"  (1.803 m)  Wt 57.8  kg (127 lb 6.8 oz)  BMI 17.78 kg/m2  SpO2 98%  Physical Exam: General: Alert and awake oriented x2 not in any acute distress. HEENT: anicteric sclera, pupils reactive to light and accommodation CVS: S1-S2 clear no murmur rubs or gallops Chest: clear to auscultation bilaterally, no wheezing rales or rhonchi Abdomen: soft nontender, nondistended, normal bowel sounds Extremities: no cyanosis, clubbing or edema noted bilaterally    The results of significant diagnostics from this hospitalization (including imaging, microbiology, ancillary and laboratory) are listed below for reference.    LAB RESULTS: Basic Metabolic Panel:  Recent Labs Lab 08/29/14 1535 08/30/14 0510 08/30/14 1145  NA 142 141  --   K 3.0* 2.4* 3.8  CL 96 95*  --   CO2 31 34*  --   GLUCOSE 125* 128*  --   BUN 45* 43*  --   CREATININE 1.90* 1.75*  --   CALCIUM 7.5* 7.3*  --   MG 1.2* 1.1*  --    Liver Function Tests:  Recent Labs Lab 08/24/14 0523  AST 231*  ALT 275*  ALKPHOS 79  BILITOT 2.1*  PROT 4.7*  ALBUMIN 2.3*   No results for input(s): LIPASE, AMYLASE in the last 168 hours. No results for input(s): AMMONIA in the last 168  hours. CBC:  Recent Labs Lab 08/26/14 0940 08/28/14 0420  WBC 7.4 9.9  HGB 13.2 12.4*  HCT 39.8 37.3*  MCV 92.3 91.6  PLT 76* 102*   Cardiac Enzymes: No results for input(s): CKTOTAL, CKMB, CKMBINDEX, TROPONINI in the last 168 hours. BNP: Invalid input(s): POCBNP CBG:  Recent Labs Lab 08/30/14 0800 08/30/14 1248  GLUCAP 104* 119*    Significant Diagnostic Studies:  Dg Chest 2 View  08/20/2014   CLINICAL DATA:  Found on floor in bathroom, lower extremity edema.  EXAM: CHEST  2 VIEW  COMPARISON:  07/15/2014  FINDINGS: Single lead left-sided pacemaker in place. The heart is enlarged, likely progressed from prior exam. Small right pleural effusion, slightly decreased in degree from prior exam. Probable tiny left pleural effusion. Decreased vascular  congestion. No pneumothorax. No confluent airspace disease.  IMPRESSION: Decreased right pleural effusion and vascular congestion, however probable slight increase in cardiomegaly.   Electronically Signed   By: Rubye Oaks M.D.   On: 08/20/2014 23:21   Ct Head Wo Contrast  08/20/2014   CLINICAL DATA:  81 year old male with confusion after fall.  EXAM: CT HEAD WITHOUT CONTRAST  CT CERVICAL SPINE WITHOUT CONTRAST  TECHNIQUE: Multidetector CT imaging of the head and cervical spine was performed following the standard protocol without intravenous contrast. Multiplanar CT image reconstructions of the cervical spine were also generated.  COMPARISON:  Head CT 11/09/2013  FINDINGS: CT HEAD FINDINGS  Patient had difficulty tolerating the exam, moderate patient motion artifact. No intracranial hemorrhage, mass effect, or midline shift. No hydrocephalus. The basilar cisterns are patent. No evidence of acute infarct. 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 Unchanged atrophy and chronic small vessel ischemic change. No intracranial fluid collection. Atherosclerosis noted of the skullbase vasculature. Calvarium is intact. There is mild diffuse paranasal sinus mucosal thickening. The mastoid air cells are well aerated.  CT CERVICAL SPINE FINDINGS  Cervical spine alignment is maintained. Vertebral body heights are preserved. There is no fracture. The dens is intact. There are no jumped or perched facets. There is multilevel degenerative disc disease most significant at C6-C7. Multilevel facet arthropathy. No prevertebral soft tissue edema.  IMPRESSION: 1. No acute intracranial abnormality. Stable chronic findings including remote left MCA distribution infarct. 2. Degenerative change in the cervical spine without acute fracture or subluxation.   Electronically Signed   By: Rubye Oaks M.D.   On: 08/20/2014 22:11   Ct Cervical Spine Wo Contrast  08/20/2014   CLINICAL DATA:  81 year old male with  confusion after fall.  EXAM: CT HEAD WITHOUT CONTRAST  CT CERVICAL SPINE WITHOUT CONTRAST  TECHNIQUE: Multidetector CT imaging of the head and cervical spine was performed following the standard protocol without intravenous contrast. Multiplanar CT image reconstructions of the cervical spine were also generated.  COMPARISON:  Head CT 11/09/2013  FINDINGS: CT HEAD FINDINGS  Patient had difficulty tolerating the exam, moderate patient motion artifact. No intracranial hemorrhage, mass effect, or midline shift. No hydrocephalus. The basilar cisterns are patent. No evidence of acute infarct. 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 Unchanged atrophy and chronic small vessel ischemic change. No intracranial fluid collection. Atherosclerosis noted of the skullbase vasculature. Calvarium is intact. There is mild diffuse paranasal sinus mucosal thickening. The mastoid air cells are well aerated.  CT CERVICAL SPINE FINDINGS  Cervical spine alignment is maintained. Vertebral body heights are preserved. There is no fracture. The dens is intact. There are no jumped or perched facets. There is  multilevel degenerative disc disease most significant at C6-C7. Multilevel facet arthropathy. No prevertebral soft tissue edema.  IMPRESSION: 1. No acute intracranial abnormality. Stable chronic findings including remote left MCA distribution infarct. 2. Degenerative change in the cervical spine without acute fracture or subluxation.   Electronically Signed   By: Rubye Oaks M.D.   On: 08/20/2014 22:11   Dg Chest Port 1 View  08/21/2014   CLINICAL DATA:  Pneumonia and shortness of breath  EXAM: PORTABLE CHEST - 1 VIEW  COMPARISON:  08/20/2014  FINDINGS: Cardiopericardial enlargement is unchanged from yesterday. Stable aortic and hilar contours. A single chamber pacer lead from the left is in stable position when accounting for differences in technique. Stable small right pleural effusion with underlying interstitial  coarsening. Pleural thickening along the lateral right chest wall is also unchanged. No definitive pneumonia. No edema or pneumothorax.  IMPRESSION: Stable small right pleural effusion.   Electronically Signed   By: Marnee Spring M.D.   On: 08/21/2014 06:48    2D ECHO:   Disposition and Follow-up: Discharge Instructions    (HEART FAILURE PATIENTS) Call MD:  Anytime you have any of the following symptoms: 1) 3 pound weight gain in 24 hours or 5 pounds in 1 week 2) shortness of breath, with or without a dry hacking cough 3) swelling in the hands, feet or stomach 4) if you have to sleep on extra pillows at night in order to breathe.    Complete by:  As directed      Discharge instructions    Complete by:  As directed   Diet: Dysphagia 3, thin liquids     Increase activity slowly    Complete by:  As directed             DISPOSITION: SNF   DISCHARGE FOLLOW-UP Follow-up Information    Follow up with GATES,Lindy NEVILL, MD. Schedule an appointment as soon as possible for a visit in 10 days.   Specialty:  Internal Medicine   Why:  for hospital follow-up   Contact information:   [address] [place] [address] [phone]       Follow up with Lesleigh Noe, MD. Schedule an appointment as soon as possible for a visit in 2 weeks.   Specialty:  Cardiology   Why:  for hospital follow-up   Contact information:   [address] [place] [address] [phone]        Time spent on Discharge: 35 minutes  Signed:   RAI,RIPUDEEP M.D. [facility] 08/30/2014, 1:49 PM Pager: [phone]

## 2014-09-07 ENCOUNTER — Encounter: Payer: Medicare Other | Admitting: Physician Assistant

## 2014-09-07 NOTE — Progress Notes (Signed)
Cardiology Office Note   Date:  09/07/2014   ID:  Charles Hendricks, DOB 06-16-1924, MRN [id number]  PCP:  Charles Dubonnet, MD  Cardiologist:  Dr. Verdis Prime   Electrophysiologist:  Dr. Lewayne Bunting   No chief complaint on file.    History of Present Illness: Charles Hendricks is a 81 y.o. male with a hx of chronic atrial fibrillation, NICM with EF 35%, chronic combined CHF, cognitive impairment, HTN, CHB s/p PPM, CKD, non-compliance, frailty.  He is not a candidate for anticoagulation.    Admitted 3/5-3/9 with a/c systolic CHF.  CXR demonstrated R effusion.  He was diuresed.  He was noted to have AF with RVR and NSVT at times.  NSVT was also noted on PPM interrogation.  Digoxin was resumed.  Toprol-XL was added.  Echo demonstrated EF 25-30%.  Patient has a poor social situation. He was seen by case management and social work.  SNF recommended by case management but patient and sister refused.  Home health arranged.    I saw him in follow-up 07/26/14. He was here with his sister and was quite confused about his medications. I stressed the importance of taking his medications correctly and we spent some time going through his medications.  Admitted 4/10-4/20 after being found on the floor in his home.  He was confused. He was noted to be hypothermic, hypoglycemic and hypotensive with elevated lactic acid levels. Head CT was unremarkable. He was followed by critical care for SIRS. Patient was treated with antibiotics for possible sepsis, but source of infection was never found. His encephalopathy was slow to improve and at one point was made DO NOT RESUSCITATE. He did develop volume excess and was diuresed. His mentation eventually improved.  He was seen by speech therapy and placed on dysphagia 3 diet. He did have elevated troponins. Cardiology was not consulted. It was felt that this was related to demand ischemia.  He did have worsening renal function and his ACE inhibitor was stopped.  He  returns for FU.     Studies/Reports Reviewed Today:  Carotid US 08/22/14 Bilateral ICA 0-39%  Echo 07/16/14 - EF 25% to 30%. Diffuse hypokinesis. - Ventricular septum: The contour showed diastolic flattening and systolic flattening. - Aortic valve: There was mild regurgitation. - Mitral valve: There was mild regurgitation. - Left atrium: The atrium was severely dilated. - Right ventricle: Systolic function was mildly reduced. - Right atrium: The atrium was severely dilated. - Atrial septum: There was an atrial septal aneurysm. - Tricuspid valve: There was severe regurgitation. - Pulmonary arteries: Systolic pressure was severely increased. - Pericardium, extracardiac: A trivial pericardial effusion was identified. Impressions:  Severe global reduction in LV function; severe biatrial enlargement; mild MR and AI; severe TR; severely elevated pulmonary pressure.   Past Medical History  Diagnosis Date  . Bradycardia   . Chronic atrial fibrillation     Sick Sinus syndrome 1998  . Complete heart block     a. Initial placement 2005. s/p Medtronic pacemaker gen change 2013.  Marland Kitchen HTN (hypertension), benign   . Pacemaker-Medtronic   . Non-ischemic cardiomyopathy     last EF 35% by Echo at [facility] 2008  . Gout   . Elevated PSA     asymptomatic  . Corns and callosities   . Cognitive impairment     a. Taken off Coumadin due to this.  . Chronic combined systolic and diastolic CHF (congestive heart failure)   . CKD (chronic kidney disease), stage III  Past Surgical History  Procedure Laterality Date  . Pacemaker insertion  1998    power source change 2005  . Permanent pacemaker generator change N/A 03/05/2012    Procedure: PERMANENT PACEMAKER GENERATOR CHANGE;  Surgeon: Marinus Maw, MD;  Location: [facility] CATH LAB;  Service: Cardiovascular;  Laterality: N/A;     Current Outpatient Prescriptions  Medication Sig Dispense Refill  . furosemide (LASIX) 40 MG tablet Take 1 tablet (40 mg  total) by mouth 2 (two) times daily. 1 tablet early a.m. and one tablet midafternoon. 60 tablet 3  . Magnesium Oxide 400 MG CAPS Take 1 capsule (400 mg total) by mouth 2 (two) times daily. 30 capsule 3  . metoprolol succinate (TOPROL-XL) 50 MG 24 hr tablet Take 1 tablet (50 mg total) by mouth daily. Take with or immediately following a meal. 30 tablet 3  . potassium chloride SA (K-DUR,KLOR-CON) 20 MEQ tablet Take 1 tablet (20 mEq total) by mouth daily. 90 tablet 3   No current facility-administered medications for this visit.    Allergies:   Asa    Social History:  The patient  reports that he has quit smoking. He does not have any smokeless tobacco history on file. He reports that he does not drink alcohol.   Family History:  The patient's family history includes Heart failure in his mother.    ROS:   Please see the history of present illness.   ROS   PHYSICAL EXAM: VS:  There were no vitals taken for this visit.    Wt Readings from Last 3 Encounters:  08/26/14 127 lb 6.8 oz (57.8 kg)  07/26/14 121 lb (54.885 kg)  07/19/14 119 lb 6.4 oz (54.159 kg)     GEN: Well nourished, well developed, in no acute distress HEENT: normal Neck:  JVD, no masses Cardiac:  Normal S1/S2, RRR; 2/6 systolic murmur LSB,  no rubs or gallops,  edema  Respiratory:  Decreased breath sounds bilateral bases, no wheezing, rhonchi or rales. GI: soft, nontender, nondistended, + BS MS: no deformity or atrophy Skin: warm and dry  Neuro:  CNs II-XII intact, Strength and sensation are intact Psych: Normal affect   EKG:  EKG is ordered today.  It demonstrates:      Recent Labs: 10/22/2013: Pro B Natriuretic peptide (BNP) 3408.0* 08/21/2014: TSH 2.592 08/24/2014: ALT 275* 08/27/2014: B Natriuretic Peptide 1117.6* 08/28/2014: Hemoglobin 12.4*; Platelets 102* 08/30/2014: BUN 43*; Creatinine 1.75*; Magnesium 1.1*; Potassium 3.8; Sodium 141    Recent Labs  11/09/13 1248  08/20/14 1914 08/21/14 0431  08/21/14 1214 08/21/14 2333  TROPONINI  --   < > 0.06* 0.10* 0.15* 0.17*  TROPIPOC 0.04  --   --   --   --   --   < > = values in this interval not displayed.    Lipid Panel    Component Value Date/Time   CHOL 133 08/21/2014 0431   TRIG 28 08/21/2014 0431   HDL 44 08/21/2014 0431   CHOLHDL 3.0 08/21/2014 0431   VLDL 6 08/21/2014 0431   LDLCALC 83 08/21/2014 0431      ASSESSMENT AND PLAN:  Chronic systolic congestive heart failure   NICM (nonischemic cardiomyopathy)   He is not a candidate for AICD.  Chronic atrial fibrillation Rate controlled. He is not a candidate for anticoagulation.  CKD (chronic kidney disease), unspecified stage   Pacemaker Follow up with EP as planned.  Essential hypertension  Controlled.   Current medicines are reviewed at length with the  patient today.  Any concerns regarding medicines are discussed above.  The following changes have been made:        Labs/ tests ordered today include:   No orders of the defined types were placed in this encounter.    Disposition:   FU    Signed, Brynda Rim, MHS 09/07/2014 8:04 AM    [facility] [address] [place], [place], [place]  [address] Phone: [phone]; Fax: [phone]    This encounter was created in error - please disregard.

## 2014-09-08 ENCOUNTER — Ambulatory Visit (INDEPENDENT_AMBULATORY_CARE_PROVIDER_SITE_OTHER): Payer: Medicare Other | Admitting: Nurse Practitioner

## 2014-09-08 ENCOUNTER — Encounter: Payer: Self-pay | Admitting: Nurse Practitioner

## 2014-09-08 VITALS — BP 122/80 | HR 80 | Resp 18 | Ht 69.0 in | Wt 138.0 lb

## 2014-09-08 DIAGNOSIS — I482 Chronic atrial fibrillation, unspecified: Secondary | ICD-10-CM

## 2014-09-08 DIAGNOSIS — I5042 Chronic combined systolic (congestive) and diastolic (congestive) heart failure: Secondary | ICD-10-CM | POA: Diagnosis not present

## 2014-09-08 DIAGNOSIS — I429 Cardiomyopathy, unspecified: Secondary | ICD-10-CM | POA: Diagnosis not present

## 2014-09-08 DIAGNOSIS — R54 Age-related physical debility: Secondary | ICD-10-CM

## 2014-09-08 DIAGNOSIS — I10 Essential (primary) hypertension: Secondary | ICD-10-CM

## 2014-09-08 DIAGNOSIS — I428 Other cardiomyopathies: Secondary | ICD-10-CM

## 2014-09-08 DIAGNOSIS — R531 Weakness: Secondary | ICD-10-CM

## 2014-09-08 LAB — CBC
HCT: 35.5 % — ABNORMAL LOW (ref 39.0–52.0)
Hemoglobin: 11.3 g/dL — ABNORMAL LOW (ref 13.0–17.0)
MCH: 30.1 pg (ref 26.0–34.0)
MCHC: 31.8 g/dL (ref 30.0–36.0)
MCV: 94.7 fL (ref 78.0–100.0)
MPV: 10.2 fL (ref 8.6–12.4)
Platelets: 256 10*3/uL (ref 150–400)
RBC: 3.75 MIL/uL — ABNORMAL LOW (ref 4.22–5.81)
RDW: 16.8 % — ABNORMAL HIGH (ref 11.5–15.5)
WBC: 7.8 10*3/uL (ref 4.0–10.5)

## 2014-09-08 LAB — HEPATIC FUNCTION PANEL
ALT: 36 U/L (ref 0–53)
AST: 37 U/L (ref 0–37)
Albumin: 2.9 g/dL — ABNORMAL LOW (ref 3.5–5.2)
Alkaline Phosphatase: 104 U/L (ref 39–117)
Bilirubin, Direct: 0.4 mg/dL — ABNORMAL HIGH (ref 0.0–0.3)
Indirect Bilirubin: 0.7 mg/dL (ref 0.2–1.2)
Total Bilirubin: 1.1 mg/dL (ref 0.2–1.2)
Total Protein: 6 g/dL (ref 6.0–8.3)

## 2014-09-08 LAB — BASIC METABOLIC PANEL
BUN: 54 mg/dL — ABNORMAL HIGH (ref 6–23)
CO2: 23 mEq/L (ref 19–32)
Calcium: 8.5 mg/dL (ref 8.4–10.5)
Chloride: 103 mEq/L (ref 96–112)
Creat: 1.78 mg/dL — ABNORMAL HIGH (ref 0.50–1.35)
Glucose, Bld: 99 mg/dL (ref 70–99)
Potassium: 4.1 mEq/L (ref 3.5–5.3)
Sodium: 142 mEq/L (ref 135–145)

## 2014-09-08 MED ORDER — POTASSIUM CHLORIDE CRYS ER 20 MEQ PO TBCR: 20.0000 meq | EXTENDED_RELEASE_TABLET | Freq: Two times a day (BID) | ORAL | Status: AC

## 2014-09-08 MED ORDER — FUROSEMIDE 40 MG PO TABS: 60.0000 mg | ORAL_TABLET | Freq: Two times a day (BID) | ORAL | Status: AC

## 2014-09-08 NOTE — Progress Notes (Signed)
CARDIOLOGY OFFICE NOTE  Date:  09/08/2014    Antony Blackbird Date of Birth: [birth year] Medical Record #[id number]  PCP:  Pearla Dubonnet, MD  Cardiologist:  Katrinka Blazing    Chief Complaint  Patient presents with  . Congestive Heart Failure    Work in visit - seen for Dr. Katrinka Blazing     History of Present Illness: Lacharles Altschuler is a 81 y.o. male who presents today for a work in visit. Seen for Dr. Katrinka Blazing. He has a hx of chronic atrial fibrillation, NICM with EF 35%, chronic combined CHF, cognitive impairment/psychotic disorder, generalized weakness, HTN, CHB s/p PPM - followed by Dr. Ladona Ridgel, CKD, non-compliance, & frailty. He is not a candidate for anticoagulation.   Admitted 3/5-3/9 with a/c systolic CHF. CXR demonstrated R effusion. He was diuresed. He was noted to have AF with RVR and NSVT at times. NSVT was also noted on PPM interrogation. Digoxin was resumed. Toprol-XL was added. Echo demonstrated EF 25-30%. Patient with a poor social situation. He was seen by case management and social work. SNF recommended by case management but patient and sister refused. Home health arranged.   Saw Tereso Newcomer, [place] last month - was here with his sister. Noted lots of confusion on both the sister and the patient in regards to medicines. Getting too much salt - eating frozen dinners.  He was admitted on 4/10 after being found on the floor confused. Initial CT scan of head unremarkable. Patient noted to be hypothermic, hypoglycemic and hypotensive with elevated lactic acid levels.   Patient's cause of encephalopathy has been unclear. Unable to get MRI due to pacemaker. Repeat CT scan done also unrevealing. Source of questionable infection was not found. Patient was placed on IV fluids for sepsis and hydration. After discussion with the patient's granddaughter, who is the patient's healthcare power of attorney, she and her sister have opted to make the patient comfort care and have changed him  to a DO NOT RESUSCITATE status on 4/16. He was discharged to SNF.  Comes back today. Here with his sister. She says she thought "someone should lay eyes on him". Not clear if there is an acute issue going on. He says he is ok. He has lost his teeth. Not short of breath. Lots of swelling in his legs. Weight is up. He is asking where his new shoes are - asks this repeatedly during our visit. Not clear as to what the long term plan for him.   Past Medical History  Diagnosis Date  . Bradycardia   . Chronic atrial fibrillation     Sick Sinus syndrome 1998  . Complete heart block     a. Initial placement 2005. s/p Medtronic pacemaker gen change 2013.  Marland Kitchen HTN (hypertension), benign   . Pacemaker-Medtronic   . Non-ischemic cardiomyopathy     last EF 35% by Echo at [facility] 2008  . Gout   . Elevated PSA     asymptomatic  . Corns and callosities   . Cognitive impairment     a. Taken off Coumadin due to this.  . Chronic combined systolic and diastolic CHF (congestive heart failure)   . CKD (chronic kidney disease), stage III     Past Surgical History  Procedure Laterality Date  . Pacemaker insertion  1998    power source change 2005  . Permanent pacemaker generator change N/A 03/05/2012    Procedure: PERMANENT PACEMAKER GENERATOR CHANGE;  Surgeon: Marinus Maw, MD;  Location: [facility] CATH LAB;  Service: Cardiovascular;  Laterality: N/A;     Medications: Current Outpatient Prescriptions  Medication Sig Dispense Refill  . furosemide (LASIX) 40 MG tablet Take 1.5 tablets (60 mg total) by mouth 2 (two) times daily. 1 tablet early a.m. and one tablet midafternoon. 90 tablet 3  . Magnesium Oxide 400 MG CAPS Take 1 capsule (400 mg total) by mouth 2 (two) times daily. 30 capsule 3  . metoprolol succinate (TOPROL-XL) 50 MG 24 hr tablet Take 1 tablet (50 mg total) by mouth daily. Take with or immediately following a meal. 30 tablet 3  . potassium chloride SA (K-DUR,KLOR-CON) 20 MEQ tablet Take 1 tablet  (20 mEq total) by mouth 2 (two) times daily. 60 tablet 3   No current facility-administered medications for this visit.    Allergies: Allergies  Allergen Reactions  . Asa [Aspirin] Other (See Comments)    Told not to take med-per MD    Social History: The patient  reports that he has quit smoking. He does not have any smokeless tobacco history on file. He reports that he does not drink alcohol.   Family History: The patient's family history includes Heart failure in his mother.   Review of Systems: Please see the history of present illness.   Otherwise, the review of systems is positive for .   All other systems are reviewed and negative.   Physical Exam: VS:  BP 122/80 mmHg  Pulse 80  Resp 18  Ht  (1.753 m)  Wt 138 lb (62.596 kg)  BMI 20.37 kg/m2 .  BMI Body mass index is 20.37 kg/(m^2).  Wt Readings from Last 3 Encounters:  09/08/14 138 lb (62.596 kg)  08/26/14 127 lb 6.8 oz (57.8 kg)  07/26/14 121 lb (54.885 kg)    General: Frail. Looks chronically ill but in no acute distress.  HEENT: Normal. Neck: Supple, no JVD, carotid bruits, or masses noted.  Cardiac: Irregular irregular rhythm with controlled rate.  Over 2+ edema.  Respiratory:  Lungs with bibasilar rales with normal work of breathing.  GI: Soft and nontender.  MS: No deformity or atrophy. Gait not tested. He is in a wheelchair.  Skin: Warm and dry. Color is normal.  Neuro:  Strength and sensation are intact and no gross focal deficits noted.  Psych: Alert, appropriate and with normal affect.   LABORATORY DATA:  EKG:  EKG is not ordered today.  Lab Results  Component Value Date   WBC 9.9 08/28/2014   HGB 12.4* 08/28/2014   HCT 37.3* 08/28/2014   PLT 102* 08/28/2014   GLUCOSE 128* 08/30/2014   CHOL 133 08/21/2014   TRIG 28 08/21/2014   HDL 44 08/21/2014   LDLCALC 83 08/21/2014   ALT 275* 08/24/2014   AST 231* 08/24/2014   NA 141 08/30/2014   K 3.8 08/30/2014   CL 95* 08/30/2014    CREATININE 1.75* 08/30/2014   BUN 43* 08/30/2014   CO2 34* 08/30/2014   TSH 2.592 08/21/2014   INR 2.13* 08/20/2014   HGBA1C 6.5* 08/21/2014    BNP (last 3 results)  Recent Labs  07/15/14 1531 08/27/14 1807  BNP 1049.7* 1117.6*    ProBNP (last 3 results)  Recent Labs  10/22/13 1600  PROBNP 3408.0*     Other Studies Reviewed Today:  Echo 07/16/14 - EF 25% to 30%. Diffuse hypokinesis. - Ventricular septum: The contour showed diastolic flattening and systolic flattening. - Aortic valve: There was mild regurgitation. - Mitral valve: There was mild regurgitation. - Left  atrium: The atrium was severely dilated. - Right ventricle: Systolic function was mildly reduced. - Right atrium: The atrium was severely dilated. - Atrial septum: There was an atrial septal aneurysm. - Tricuspid valve: There was severe regurgitation. - Pulmonary arteries: Systolic pressure was severely increased. - Pericardium, extracardiac: A trivial pericardial effusion was identified.  Impressions: Severe global reduction in LV function; severe biatrial enlargement; mild MR and AI; severe TR; severely elevated pulmonary pressure.    Assessment/Plan:  Chronic systolic congestive heart failure Volume overload. Weight is up. Now in a more controlled environment so hopefully he is getting his medicines. Will increase his Lasix to 60 mg BID and increase Potassium to BID. See back in about 8 weeks. Overall prognosis poor. He is apparently a DNR. Will recheck his labs today.   NICM (nonischemic cardiomyopathy) Continue with current regimen. Overall course is tenuous at best.   Chronic atrial fibrillation Rate controlled. He is not a candidate for anticoagulation.  CKD (chronic kidney disease), unspecified stage  Pacemaker Follow up with EP as planned.  Essential hypertension  Controlled.  Current medicines are reviewed with the patient today.  The patient does not have concerns regarding  medicines other than what has been noted above.  The following changes have been made:  See above.  Labs/ tests ordered today include:    Orders Placed This Encounter  Procedures  . Basic metabolic panel  . Hepatic function panel  . CBC     Disposition:   FU with Dr. Katrinka Blazing and me in 6 to 8 weeks.  Patient is agreeable to this plan and will call if any problems develop in the interim.   Signed: Rosalio Macadamia, RN, ANP-C 09/08/2014 4:05 PM  [facility] [address] [place], [place]  [address] Phone: [phone] Fax: [phone]

## 2014-09-08 NOTE — Patient Instructions (Addendum)
We will be checking the following labs today - BMET, CBC & HPF  Medication Instructions:    Continue with your current medicines but  Increase the Lasix to 60 mg twice a day  Increase the Potassium to twice a day    Testing/Procedures To Be Arranged:  N/A  Follow-Up:   We will see you back in 6 to 8 weeks - May 24th at 2:00 PM with me    Other Special Instructions:   N/A  Call the [facility] office at [phone] if you have any questions, problems or concerns.

## 2014-09-15 ENCOUNTER — Encounter (HOSPITAL_COMMUNITY): Payer: Self-pay | Admitting: Emergency Medicine

## 2014-09-15 ENCOUNTER — Inpatient Hospital Stay (HOSPITAL_COMMUNITY)
Admission: EM | Admit: 2014-09-15 | Discharge: 2014-10-11 | DRG: 871 | Disposition: E | Payer: Medicare Other | Attending: Internal Medicine | Admitting: Internal Medicine

## 2014-09-15 DIAGNOSIS — A419 Sepsis, unspecified organism: Secondary | ICD-10-CM | POA: Diagnosis present

## 2014-09-15 DIAGNOSIS — L89302 Pressure ulcer of unspecified buttock, stage 2: Secondary | ICD-10-CM | POA: Diagnosis present

## 2014-09-15 DIAGNOSIS — N183 Chronic kidney disease, stage 3 unspecified: Secondary | ICD-10-CM | POA: Diagnosis present

## 2014-09-15 DIAGNOSIS — I482 Chronic atrial fibrillation, unspecified: Secondary | ICD-10-CM | POA: Diagnosis present

## 2014-09-15 DIAGNOSIS — Z515 Encounter for palliative care: Secondary | ICD-10-CM

## 2014-09-15 DIAGNOSIS — E860 Dehydration: Secondary | ICD-10-CM | POA: Diagnosis present

## 2014-09-15 DIAGNOSIS — I429 Cardiomyopathy, unspecified: Secondary | ICD-10-CM | POA: Diagnosis present

## 2014-09-15 DIAGNOSIS — I959 Hypotension, unspecified: Secondary | ICD-10-CM | POA: Diagnosis present

## 2014-09-15 DIAGNOSIS — L97219 Non-pressure chronic ulcer of right calf with unspecified severity: Secondary | ICD-10-CM | POA: Diagnosis present

## 2014-09-15 DIAGNOSIS — T68XXXA Hypothermia, initial encounter: Secondary | ICD-10-CM | POA: Diagnosis present

## 2014-09-15 DIAGNOSIS — G9340 Encephalopathy, unspecified: Secondary | ICD-10-CM | POA: Diagnosis present

## 2014-09-15 DIAGNOSIS — R040 Epistaxis: Secondary | ICD-10-CM | POA: Diagnosis not present

## 2014-09-15 DIAGNOSIS — I5043 Acute on chronic combined systolic (congestive) and diastolic (congestive) heart failure: Secondary | ICD-10-CM | POA: Diagnosis present

## 2014-09-15 DIAGNOSIS — E46 Unspecified protein-calorie malnutrition: Secondary | ICD-10-CM | POA: Diagnosis present

## 2014-09-15 DIAGNOSIS — Z681 Body mass index (BMI) 19 or less, adult: Secondary | ICD-10-CM

## 2014-09-15 DIAGNOSIS — R4182 Altered mental status, unspecified: Secondary | ICD-10-CM

## 2014-09-15 DIAGNOSIS — Z66 Do not resuscitate: Secondary | ICD-10-CM | POA: Diagnosis present

## 2014-09-15 DIAGNOSIS — R6510 Systemic inflammatory response syndrome (SIRS) of non-infectious origin without acute organ dysfunction: Principal | ICD-10-CM | POA: Diagnosis present

## 2014-09-15 DIAGNOSIS — I739 Peripheral vascular disease, unspecified: Secondary | ICD-10-CM | POA: Diagnosis present

## 2014-09-15 DIAGNOSIS — F0390 Unspecified dementia without behavioral disturbance: Secondary | ICD-10-CM | POA: Diagnosis present

## 2014-09-15 DIAGNOSIS — E162 Hypoglycemia, unspecified: Secondary | ICD-10-CM | POA: Diagnosis present

## 2014-09-15 DIAGNOSIS — L89212 Pressure ulcer of right hip, stage 2: Secondary | ICD-10-CM | POA: Diagnosis present

## 2014-09-15 DIAGNOSIS — E872 Acidosis, unspecified: Secondary | ICD-10-CM | POA: Diagnosis present

## 2014-09-15 DIAGNOSIS — R627 Adult failure to thrive: Secondary | ICD-10-CM | POA: Diagnosis present

## 2014-09-15 DIAGNOSIS — Z87891 Personal history of nicotine dependence: Secondary | ICD-10-CM

## 2014-09-15 DIAGNOSIS — Z950 Presence of cardiac pacemaker: Secondary | ICD-10-CM

## 2014-09-15 DIAGNOSIS — N19 Unspecified kidney failure: Secondary | ICD-10-CM

## 2014-09-15 DIAGNOSIS — N179 Acute kidney failure, unspecified: Secondary | ICD-10-CM | POA: Diagnosis present

## 2014-09-15 DIAGNOSIS — E875 Hyperkalemia: Secondary | ICD-10-CM | POA: Diagnosis present

## 2014-09-15 DIAGNOSIS — I472 Ventricular tachycardia: Secondary | ICD-10-CM | POA: Diagnosis not present

## 2014-09-15 DIAGNOSIS — I129 Hypertensive chronic kidney disease with stage 1 through stage 4 chronic kidney disease, or unspecified chronic kidney disease: Secondary | ICD-10-CM | POA: Diagnosis present

## 2014-09-15 LAB — CBC WITH DIFFERENTIAL/PLATELET
Basophils Absolute: 0 10*3/uL (ref 0.0–0.1)
Basophils Relative: 0 % (ref 0–1)
Eosinophils Absolute: 0 10*3/uL (ref 0.0–0.7)
Eosinophils Relative: 0 % (ref 0–5)
HCT: 38.9 % — ABNORMAL LOW (ref 39.0–52.0)
Hemoglobin: 13 g/dL (ref 13.0–17.0)
Lymphocytes Relative: 4 % — ABNORMAL LOW (ref 12–46)
Lymphs Abs: 0.3 10*3/uL — ABNORMAL LOW (ref 0.7–4.0)
MCH: 30.4 pg (ref 26.0–34.0)
MCHC: 33.4 g/dL (ref 30.0–36.0)
MCV: 91.1 fL (ref 78.0–100.0)
MONO ABS: 0.7 10*3/uL (ref 0.1–1.0)
Monocytes Relative: 9 % (ref 3–12)
NEUTROS ABS: 6.7 10*3/uL (ref 1.7–7.7)
NEUTROS PCT: 87 % — AB (ref 43–77)
Platelets: 156 10*3/uL (ref 150–400)
RBC: 4.27 MIL/uL (ref 4.22–5.81)
RDW: 16.6 % — AB (ref 11.5–15.5)
WBC: 7.6 10*3/uL (ref 4.0–10.5)

## 2014-09-15 LAB — BASIC METABOLIC PANEL
ANION GAP: 19 — AB (ref 5–15)
BUN: 78 mg/dL — ABNORMAL HIGH (ref 6–20)
CO2: 20 mmol/L — ABNORMAL LOW (ref 22–32)
Calcium: 8.9 mg/dL (ref 8.9–10.3)
Chloride: 103 mmol/L (ref 101–111)
Creatinine, Ser: 2.6 mg/dL — ABNORMAL HIGH (ref 0.61–1.24)
GFR calc Af Amer: 24 mL/min — ABNORMAL LOW (ref >60)
GFR calc non Af Amer: 20 mL/min — ABNORMAL LOW (ref >60)
Glucose, Bld: 112 mg/dL — ABNORMAL HIGH (ref 70–99)
POTASSIUM: 5.7 mmol/L — AB (ref 3.5–5.1)
Sodium: 142 mmol/L (ref 135–145)

## 2014-09-15 LAB — URINALYSIS, ROUTINE W REFLEX MICROSCOPIC
Bilirubin Urine: NEGATIVE
GLUCOSE, UA: NEGATIVE mg/dL
Hgb urine dipstick: NEGATIVE
Ketones, ur: NEGATIVE mg/dL
LEUKOCYTES UA: NEGATIVE
Nitrite: NEGATIVE
PH: 5 (ref 5.0–8.0)
PROTEIN: NEGATIVE mg/dL
Specific Gravity, Urine: 1.01 (ref 1.005–1.030)
Urobilinogen, UA: 0.2 mg/dL (ref 0.0–1.0)

## 2014-09-15 LAB — I-STAT CHEM 8, ED
BUN: 74 mg/dL — AB (ref 6–20)
CREATININE: 2.5 mg/dL — AB (ref 0.61–1.24)
Calcium, Ion: 1.08 mmol/L — ABNORMAL LOW (ref 1.13–1.30)
Chloride: 105 mmol/L (ref 101–111)
GLUCOSE: 111 mg/dL — AB (ref 70–99)
HCT: 43 % (ref 39.0–52.0)
HEMOGLOBIN: 14.6 g/dL (ref 13.0–17.0)
Potassium: 5.7 mmol/L — ABNORMAL HIGH (ref 3.5–5.1)
Sodium: 142 mmol/L (ref 135–145)
TCO2: 24 mmol/L (ref 0–100)

## 2014-09-15 LAB — I-STAT CG4 LACTIC ACID, ED: Lactic Acid, Venous: 3.89 mmol/L (ref 0.5–2.0)

## 2014-09-15 MED ORDER — SODIUM CHLORIDE 0.9 % IV BOLUS (SEPSIS): 500.0000 mL | Freq: Once | INTRAVENOUS | Status: DC

## 2014-09-15 MED ORDER — GLUCAGON HCL RDNA (DIAGNOSTIC) 1 MG IJ SOLR
INTRAMUSCULAR | Status: AC
  Administered 2014-09-15: 1 mg
  Filled 2014-09-15: qty 1

## 2014-09-15 MED ORDER — SODIUM CHLORIDE 0.9 % IV BOLUS (SEPSIS)
500.0000 mL | Freq: Once | INTRAVENOUS | Status: AC
  Administered 2014-09-15: 500 mL via INTRAVENOUS

## 2014-09-15 MED ORDER — DEXTROSE 50 % IV SOLN
50.0000 mL | Freq: Once | INTRAVENOUS | Status: AC
  Administered 2014-09-15: 50 mL via INTRAVENOUS
  Filled 2014-09-15: qty 50

## 2014-09-15 MED ORDER — SODIUM CHLORIDE 0.9 % IV SOLN
Freq: Once | INTRAVENOUS | Status: AC
  Administered 2014-09-15: 22:00:00 via INTRAVENOUS

## 2014-09-15 NOTE — ED Notes (Signed)
Pt very aggitated, seizure pads placed on bed for patient safety.

## 2014-09-15 NOTE — ED Notes (Signed)
Pt brought to ED by [facility] from [facility] nursing home for mental status change, pt usually confuse, but today he is been agitated until half hour ago when pt is only moaning and no verbal, CBG for [facility] 69 on ED arrival to low to be read. BNP on NSF 1418.5. Pt has a DNR order from nursing home.

## 2014-09-15 NOTE — ED Provider Notes (Signed)
CSN: [id number]     Arrival date & time 09/16/2014  2050 History   First MD Initiated Contact with Patient 10/08/2014 2100     Chief Complaint  Patient presents with  . Altered Mental Status     (Consider location/radiation/quality/duration/timing/severity/associated sxs/prior Treatment) HPI Comments: 81 year old male with history of congestive heart failure, CAD, pacemaker, high blood pressure, complete heart block, atrial fibrillation, psychotic disorder, cognitive cognitive impairment, presents with altered mental status. Patient at baseline has mild confusion per report however today since around noon patient's had worsening confusion and agitation. Patient's glucose borderline prior to arrival. On arrival to low to read. No details known of recent medical issues in terms of medications except her. Patient has known with met this time. No witnessed head injury per report.  Patient is a 81 y.o. male presenting with altered mental status. The history is provided by the patient.  Altered Mental Status   Past Medical History  Diagnosis Date  . Bradycardia   . Chronic atrial fibrillation     Sick Sinus syndrome 1998  . Complete heart block     a. Initial placement 2005. s/p Medtronic pacemaker gen change 2013.  Marland Kitchen HTN (hypertension), benign   . Pacemaker-Medtronic   . Non-ischemic cardiomyopathy     last EF 35% by Echo at [facility] 2008  . Gout   . Elevated PSA     asymptomatic  . Corns and callosities   . Cognitive impairment     a. Taken off Coumadin due to this.  . Chronic combined systolic and diastolic CHF (congestive heart failure)   . CKD (chronic kidney disease), stage III    Past Surgical History  Procedure Laterality Date  . Pacemaker insertion  1998    power source change 2005  . Permanent pacemaker generator change N/A 03/05/2012    Procedure: PERMANENT PACEMAKER GENERATOR CHANGE;  Surgeon: Evans Lance, MD;  Location: [facility] CATH LAB;  Service: Cardiovascular;  Laterality:  N/A;   Family History  Problem Relation Age of Onset  . Heart failure Mother    History  Substance Use Topics  . Smoking status: Former Research scientist (life sciences)  . Smokeless tobacco: Not on file  . Alcohol Use: No    Review of Systems  Unable to perform ROS: Mental status change      Allergies  Asa  Home Medications   Prior to Admission medications   Medication Sig Start Date End Date Taking? Authorizing Provider  furosemide (LASIX) 40 MG tablet Take 1.5 tablets (60 mg total) by mouth 2 (two) times daily. 1 tablet early a.m. and one tablet midafternoon. 09/08/14   Burtis Junes, NP  Magnesium Oxide 400 MG CAPS Take 1 capsule (400 mg total) by mouth 2 (two) times daily. 08/30/14   Ripudeep Krystal Eaton, MD  metoprolol succinate (TOPROL-XL) 50 MG 24 hr tablet Take 1 tablet (50 mg total) by mouth daily. Take with or immediately following a meal. 07/19/14   Rhonda G Barrett, PA-C  potassium chloride SA (K-DUR,KLOR-CON) 20 MEQ tablet Take 1 tablet (20 mEq total) by mouth 2 (two) times daily. 09/08/14   Burtis Junes, NP   BP 99/60 mmHg  Pulse 77  Temp(Src) 97 F (36.1 C) (Rectal)  Resp 17  Wt 138 lb (62.596 kg)  SpO2 100% Physical Exam  Constitutional: He appears well-developed and well-nourished.  HENT:  Head: Normocephalic and atraumatic.  Eyes: Right eye exhibits no discharge. Left eye exhibits no discharge.  Neck: Normal range of motion. Neck  supple. No tracheal deviation present.  Cardiovascular: Normal rate and regular rhythm.   Pulmonary/Chest: Effort normal. Respiratory distress: difficult exam due to altered mental status.  Abdominal: Soft. He exhibits no distension. There is no tenderness. There is no guarding.  Musculoskeletal: He exhibits no edema.  Neurological: He is alert.  Patient encephalopathic, no discernible verbal response. Patient will not follow specific commands, patient grossly moves all extremities equal lateral. Pupils equal bilateral. No meningismus.  Skin: Skin is  warm. No rash noted.  Nursing note and vitals reviewed.   ED Course  Procedures (including critical care time) Labs Review Labs Reviewed  BASIC METABOLIC PANEL - Abnormal; Notable for the following:    Potassium 5.7 (*)    CO2 20 (*)    Glucose, Bld 112 (*)    BUN 78 (*)    Creatinine, Ser 2.60 (*)    GFR calc non Af Amer 20 (*)    GFR calc Af Amer 24 (*)    Anion gap 19 (*)    All other components within normal limits  CBC WITH DIFFERENTIAL/PLATELET - Abnormal; Notable for the following:    HCT 38.9 (*)    RDW 16.6 (*)    Neutrophils Relative % 87 (*)    Lymphocytes Relative 4 (*)    Lymphs Abs 0.3 (*)    All other components within normal limits  CBG MONITORING, ED - Abnormal; Notable for the following:    Glucose-Capillary 47 (*)    All other components within normal limits  I-STAT CG4 LACTIC ACID, ED - Abnormal; Notable for the following:    Lactic Acid, Venous 3.89 (*)    All other components within normal limits  I-STAT CHEM 8, ED - Abnormal; Notable for the following:    Potassium 5.7 (*)    BUN 74 (*)    Creatinine, Ser 2.50 (*)    Glucose, Bld 111 (*)    Calcium, Ion 1.08 (*)    All other components within normal limits  URINE CULTURE  URINALYSIS, ROUTINE W REFLEX MICROSCOPIC    Imaging Review Ct Head Wo Contrast  09/16/2014   CLINICAL DATA:  Altered mental status.  EXAM: CT HEAD WITHOUT CONTRAST  TECHNIQUE: Contiguous axial images were obtained from the base of the skull through the vertex without intravenous contrast.  COMPARISON:  08/24/2014  FINDINGS: There is no evidence of acute cortical infarct, intracranial hemorrhage, mass, midline shift, or extra-axial fluid collection. Encephalomalacia is again seen related to a chronic left MCA territory infarct about the operculum. There is mild ex vacuo dilatation of the left lateral ventricle. Periventricular white-matter hypodensities are similar to the prior study and nonspecific but compatible with chronic small  vessel ischemic disease.  Orbits are unremarkable. Mastoid air cells and visualized paranasal sinuses are clear. Atherosclerotic calcification is noted at the skull base.  IMPRESSION: 1. No evidence of acute intracranial abnormality. 2. Chronic small vessel ischemic disease and chronic left MCA infarct.   Electronically Signed   By: Logan Bores   On: 09/16/2014 00:33     EKG Interpretation   Date/Time:  Friday Sep 15 2014 21:07:30 EDT Ventricular Rate:  83 PR Interval:    QRS Duration: 100 QT Interval:  401 QTC Calculation: 471 R Axis:   -69 Text Interpretation:  Atrial fibrillation LAD, consider left anterior  fascicular block Low voltage, extremity leads Abnormal R-wave progression,  late transition overall similar to previous Confirmed by Yaphet Smethurst  MD,  Donabelle Molden ([id number]) on 09/18/2014 9:14:10 PM  MDM   Final diagnoses:  Uremia  Acute encephalopathy  Hypoglycemia  Altered mental status  Hyperkalemia  Lactic acidosis  Lactic acidosis ARF  Patient presents with worsening confusion, altered mental status workup in ER, patient hypoglycemic on arrival. Difficult exam due to altered mental status and even with normalize glucose patient still altered difficult to discern compared to baseline. Patient has been admitted for similar in the past. No fever in the ER. IV fluids given . Blood work multiple concerns including uremia, hyperkalemia, clinically dehydrated. IV fluids ordered. CT scan pending and plan for admission to telemetry.  Recurrent hypoglycemia, treated.  Paged triad.    The patients results and plan were reviewed and discussed.   Any x-rays performed were personally reviewed by myself.   Differential diagnosis were considered with the presenting HPI.  Medications  dextrose 5 %-0.9 % sodium chloride infusion (not administered)  dextrose 50 % solution 50 mL (50 mLs Intravenous Given 09/13/2014 2136)  0.9 %  sodium chloride infusion ( Intravenous New Bag/Given 09/10/2014 2144)   glucagon (human recombinant) (GLUCAGEN) 1 MG injection (1 mg  Given 10/10/2014 2127)  sodium chloride 0.9 % bolus 500 mL (0 mLs Intravenous Stopped 09/16/14 0047)  dextrose 50 % solution 50 mL (50 mLs Intravenous Given 09/16/14 0104)    Filed Vitals:   09/29/2014 2302 09/10/2014 2330 09/16/14 0000 09/16/14 0032  BP:  110/84 111/76 99/60  Pulse:   80 77  Temp: 97 F (36.1 C)     TempSrc: Rectal     Resp:  $Remo'15 26 17  'xXOfE$ Weight:      SpO2:  99% 98% 100%    Final diagnoses:  Uremia  Acute encephalopathy  Hypoglycemia  Altered mental status  Hyperkalemia  Lactic acidosis        Elnora Morrison, MD 09/16/14 [phone]

## 2014-09-15 NOTE — ED Notes (Signed)
Pt is now groaning and making movements. CBG 64

## 2014-09-16 ENCOUNTER — Inpatient Hospital Stay (HOSPITAL_COMMUNITY): Payer: Medicare Other

## 2014-09-16 ENCOUNTER — Emergency Department (HOSPITAL_COMMUNITY): Payer: Medicare Other

## 2014-09-16 DIAGNOSIS — I472 Ventricular tachycardia: Secondary | ICD-10-CM | POA: Diagnosis not present

## 2014-09-16 DIAGNOSIS — L89302 Pressure ulcer of unspecified buttock, stage 2: Secondary | ICD-10-CM | POA: Diagnosis present

## 2014-09-16 DIAGNOSIS — I959 Hypotension, unspecified: Secondary | ICD-10-CM | POA: Diagnosis not present

## 2014-09-16 DIAGNOSIS — N183 Chronic kidney disease, stage 3 (moderate): Secondary | ICD-10-CM | POA: Diagnosis not present

## 2014-09-16 DIAGNOSIS — G9340 Encephalopathy, unspecified: Secondary | ICD-10-CM | POA: Diagnosis present

## 2014-09-16 DIAGNOSIS — A419 Sepsis, unspecified organism: Secondary | ICD-10-CM | POA: Diagnosis not present

## 2014-09-16 DIAGNOSIS — I482 Chronic atrial fibrillation: Secondary | ICD-10-CM | POA: Diagnosis present

## 2014-09-16 DIAGNOSIS — N179 Acute kidney failure, unspecified: Secondary | ICD-10-CM | POA: Diagnosis present

## 2014-09-16 DIAGNOSIS — I9589 Other hypotension: Secondary | ICD-10-CM

## 2014-09-16 DIAGNOSIS — T68XXXA Hypothermia, initial encounter: Secondary | ICD-10-CM | POA: Diagnosis present

## 2014-09-16 DIAGNOSIS — I129 Hypertensive chronic kidney disease with stage 1 through stage 4 chronic kidney disease, or unspecified chronic kidney disease: Secondary | ICD-10-CM | POA: Diagnosis present

## 2014-09-16 DIAGNOSIS — F0390 Unspecified dementia without behavioral disturbance: Secondary | ICD-10-CM | POA: Diagnosis present

## 2014-09-16 DIAGNOSIS — E46 Unspecified protein-calorie malnutrition: Secondary | ICD-10-CM | POA: Diagnosis present

## 2014-09-16 DIAGNOSIS — T68XXXS Hypothermia, sequela: Secondary | ICD-10-CM | POA: Diagnosis not present

## 2014-09-16 DIAGNOSIS — R040 Epistaxis: Secondary | ICD-10-CM | POA: Diagnosis not present

## 2014-09-16 DIAGNOSIS — R4182 Altered mental status, unspecified: Secondary | ICD-10-CM | POA: Insufficient documentation

## 2014-09-16 DIAGNOSIS — R627 Adult failure to thrive: Secondary | ICD-10-CM | POA: Diagnosis present

## 2014-09-16 DIAGNOSIS — I739 Peripheral vascular disease, unspecified: Secondary | ICD-10-CM | POA: Diagnosis present

## 2014-09-16 DIAGNOSIS — R6510 Systemic inflammatory response syndrome (SIRS) of non-infectious origin without acute organ dysfunction: Secondary | ICD-10-CM | POA: Diagnosis present

## 2014-09-16 DIAGNOSIS — L89212 Pressure ulcer of right hip, stage 2: Secondary | ICD-10-CM | POA: Diagnosis present

## 2014-09-16 DIAGNOSIS — Z515 Encounter for palliative care: Secondary | ICD-10-CM | POA: Diagnosis not present

## 2014-09-16 DIAGNOSIS — E872 Acidosis: Secondary | ICD-10-CM

## 2014-09-16 DIAGNOSIS — E162 Hypoglycemia, unspecified: Secondary | ICD-10-CM | POA: Diagnosis not present

## 2014-09-16 DIAGNOSIS — E860 Dehydration: Secondary | ICD-10-CM | POA: Diagnosis present

## 2014-09-16 DIAGNOSIS — I429 Cardiomyopathy, unspecified: Secondary | ICD-10-CM | POA: Diagnosis present

## 2014-09-16 DIAGNOSIS — I5043 Acute on chronic combined systolic (congestive) and diastolic (congestive) heart failure: Secondary | ICD-10-CM

## 2014-09-16 DIAGNOSIS — Z950 Presence of cardiac pacemaker: Secondary | ICD-10-CM | POA: Diagnosis not present

## 2014-09-16 DIAGNOSIS — E875 Hyperkalemia: Secondary | ICD-10-CM | POA: Diagnosis present

## 2014-09-16 DIAGNOSIS — Z66 Do not resuscitate: Secondary | ICD-10-CM | POA: Diagnosis present

## 2014-09-16 DIAGNOSIS — Z681 Body mass index (BMI) 19 or less, adult: Secondary | ICD-10-CM | POA: Diagnosis not present

## 2014-09-16 DIAGNOSIS — L97219 Non-pressure chronic ulcer of right calf with unspecified severity: Secondary | ICD-10-CM | POA: Diagnosis present

## 2014-09-16 DIAGNOSIS — Z87891 Personal history of nicotine dependence: Secondary | ICD-10-CM | POA: Diagnosis not present

## 2014-09-16 LAB — BASIC METABOLIC PANEL
Anion gap: 12 (ref 5–15)
BUN: 73 mg/dL — ABNORMAL HIGH (ref 6–20)
CO2: 21 mmol/L — AB (ref 22–32)
CREATININE: 2.38 mg/dL — AB (ref 0.61–1.24)
Calcium: 8.4 mg/dL — ABNORMAL LOW (ref 8.9–10.3)
Chloride: 108 mmol/L (ref 101–111)
GFR calc Af Amer: 26 mL/min — ABNORMAL LOW (ref >60)
GFR calc non Af Amer: 23 mL/min — ABNORMAL LOW (ref >60)
Glucose, Bld: 121 mg/dL — ABNORMAL HIGH (ref 70–99)
Potassium: 4.6 mmol/L (ref 3.5–5.1)
SODIUM: 141 mmol/L (ref 135–145)

## 2014-09-16 LAB — BLOOD GAS, ARTERIAL
Acid-base deficit: 3 mmol/L — ABNORMAL HIGH (ref 0.0–2.0)
Bicarbonate: 20.4 mEq/L (ref 20.0–24.0)
O2 Content: 4 L/min
O2 Saturation: 99.4 %
PCO2 ART: 28.2 mmHg — AB (ref 35.0–45.0)
PH ART: 7.467 — AB (ref 7.350–7.450)
Patient temperature: 96.8
TCO2: 21.3 mmol/L (ref 0–100)
pO2, Arterial: 142 mmHg — ABNORMAL HIGH (ref 80.0–100.0)

## 2014-09-16 LAB — CBG MONITORING, ED
GLUCOSE-CAPILLARY: 241 mg/dL — AB (ref 70–99)
GLUCOSE-CAPILLARY: 47 mg/dL — AB (ref 70–99)
Glucose-Capillary: <10 mg/dL — CL (ref 70–99)
Glucose-Capillary: 64 mg/dL — ABNORMAL LOW (ref 70–99)
Glucose-Capillary: 27 mg/dL — CL (ref 70–99)
Glucose-Capillary: 71 mg/dL (ref 70–99)

## 2014-09-16 LAB — CBC
HEMATOCRIT: 35.5 % — AB (ref 39.0–52.0)
Hemoglobin: 11.6 g/dL — ABNORMAL LOW (ref 13.0–17.0)
MCH: 29.7 pg (ref 26.0–34.0)
MCHC: 32.7 g/dL (ref 30.0–36.0)
MCV: 91 fL (ref 78.0–100.0)
Platelets: 150 10*3/uL (ref 150–400)
RBC: 3.9 MIL/uL — ABNORMAL LOW (ref 4.22–5.81)
RDW: 16.5 % — AB (ref 11.5–15.5)
WBC: 7.7 10*3/uL (ref 4.0–10.5)

## 2014-09-16 LAB — APTT: aPTT: 34 seconds (ref 24–37)

## 2014-09-16 LAB — GLUCOSE, CAPILLARY
GLUCOSE-CAPILLARY: 133 mg/dL — AB (ref 70–99)
GLUCOSE-CAPILLARY: 26 mg/dL — AB (ref 70–99)
GLUCOSE-CAPILLARY: 117 mg/dL — AB (ref 70–99)
Glucose-Capillary: 139 mg/dL — ABNORMAL HIGH (ref 70–99)
Glucose-Capillary: 102 mg/dL — ABNORMAL HIGH (ref 70–99)
Glucose-Capillary: 117 mg/dL — ABNORMAL HIGH (ref 70–99)
Glucose-Capillary: 195 mg/dL — ABNORMAL HIGH (ref 70–99)

## 2014-09-16 LAB — PROTIME-INR
INR: 1.73 — ABNORMAL HIGH (ref 0.00–1.49)
Prothrombin Time: 20.4 seconds — ABNORMAL HIGH (ref 11.6–15.2)

## 2014-09-16 LAB — PROCALCITONIN: Procalcitonin: 1.42 ng/mL

## 2014-09-16 LAB — TSH: TSH: 5.152 u[IU]/mL — ABNORMAL HIGH (ref 0.350–4.500)

## 2014-09-16 LAB — LACTIC ACID, PLASMA: LACTIC ACID, VENOUS: 3.1 mmol/L — AB (ref 0.5–2.0)

## 2014-09-16 LAB — MRSA PCR SCREENING: MRSA by PCR: POSITIVE — AB

## 2014-09-16 LAB — CORTISOL: Cortisol, Plasma: 25 ug/dL

## 2014-09-16 MED ORDER — DEXTROSE 50 % IV SOLN
50.0000 mL | Freq: Once | INTRAVENOUS | Status: AC
  Administered 2014-09-16: 50 mL via INTRAVENOUS
  Filled 2014-09-16: qty 50

## 2014-09-16 MED ORDER — DEXTROSE 50 % IV SOLN
INTRAVENOUS | Status: AC
  Administered 2014-09-16: 25 mL via INTRAVENOUS
  Filled 2014-09-16: qty 50

## 2014-09-16 MED ORDER — ACETAMINOPHEN 650 MG RE SUPP: 650.0000 mg | Freq: Four times a day (QID) | RECTAL | Status: DC | PRN

## 2014-09-16 MED ORDER — CHLORHEXIDINE GLUCONATE CLOTH 2 % EX PADS
6.0000 | MEDICATED_PAD | Freq: Every day | CUTANEOUS | Status: DC
  Administered 2014-09-16 – 2014-09-17 (×2): 6 via TOPICAL

## 2014-09-16 MED ORDER — ONDANSETRON HCL 4 MG PO TABS: 4.0000 mg | ORAL_TABLET | Freq: Four times a day (QID) | ORAL | Status: DC | PRN

## 2014-09-16 MED ORDER — VANCOMYCIN HCL IN DEXTROSE 1-5 GM/200ML-% IV SOLN
1000.0000 mg | Freq: Once | INTRAVENOUS | Status: AC
  Administered 2014-09-16: 1000 mg via INTRAVENOUS
  Filled 2014-09-16: qty 200

## 2014-09-16 MED ORDER — ONDANSETRON HCL 4 MG/2ML IJ SOLN
4.0000 mg | Freq: Four times a day (QID) | INTRAMUSCULAR | Status: DC | PRN
Start: 2014-09-16 — End: 2014-09-19

## 2014-09-16 MED ORDER — DEXTROSE 10 % IV SOLN
INTRAVENOUS | Status: DC
  Administered 2014-09-16: 02:00:00 via INTRAVENOUS

## 2014-09-16 MED ORDER — MORPHINE SULFATE 2 MG/ML IJ SOLN
1.0000 mg | INTRAMUSCULAR | Status: DC | PRN
  Administered 2014-09-16 – 2014-09-17 (×3): 1 mg via INTRAVENOUS
  Filled 2014-09-16 (×3): qty 1

## 2014-09-16 MED ORDER — PIPERACILLIN-TAZOBACTAM 3.375 G IVPB 30 MIN
3.3750 g | Freq: Once | INTRAVENOUS | Status: AC
  Administered 2014-09-16: 3.375 g via INTRAVENOUS
  Filled 2014-09-16: qty 50

## 2014-09-16 MED ORDER — VANCOMYCIN HCL IN DEXTROSE 1-5 GM/200ML-% IV SOLN
1000.0000 mg | INTRAVENOUS | Status: DC
  Administered 2014-09-17: 1000 mg via INTRAVENOUS
  Filled 2014-09-16: qty 200

## 2014-09-16 MED ORDER — DEXTROSE-NACL 5-0.45 % IV SOLN
INTRAVENOUS | Status: DC
  Administered 2014-09-16: 04:00:00 via INTRAVENOUS

## 2014-09-16 MED ORDER — DEXTROSE 50 % IV SOLN
25.0000 mL | Freq: Once | INTRAVENOUS | Status: AC
  Administered 2014-09-16: 25 mL via INTRAVENOUS

## 2014-09-16 MED ORDER — CETYLPYRIDINIUM CHLORIDE 0.05 % MT LIQD
7.0000 mL | Freq: Two times a day (BID) | OROMUCOSAL | Status: DC
  Administered 2014-09-16 – 2014-09-18 (×5): 7 mL via OROMUCOSAL

## 2014-09-16 MED ORDER — DEXTROSE-NACL 5-0.9 % IV SOLN
Freq: Once | INTRAVENOUS | Status: AC
Start: 2014-09-16 — End: 2014-09-16
  Administered 2014-09-16: 01:00:00 via INTRAVENOUS

## 2014-09-16 MED ORDER — MUPIROCIN 2 % EX OINT
1.0000 "application " | TOPICAL_OINTMENT | Freq: Two times a day (BID) | CUTANEOUS | Status: DC
  Administered 2014-09-16 – 2014-09-17 (×3): 1 via NASAL

## 2014-09-16 MED ORDER — PIPERACILLIN-TAZOBACTAM IN DEX 2-0.25 GM/50ML IV SOLN
2.2500 g | Freq: Four times a day (QID) | INTRAVENOUS | Status: DC
  Administered 2014-09-16 – 2014-09-18 (×9): 2.25 g via INTRAVENOUS
  Filled 2014-09-16 (×12): qty 50

## 2014-09-16 MED ORDER — DEXTROSE 10 % IV SOLN
INTRAVENOUS | Status: AC
  Administered 2014-09-16: 10:00:00 via INTRAVENOUS

## 2014-09-16 MED ORDER — HEPARIN SODIUM (PORCINE) 5000 UNIT/ML IJ SOLN
5000.0000 [IU] | Freq: Three times a day (TID) | INTRAMUSCULAR | Status: DC
  Administered 2014-09-16 – 2014-09-17 (×5): 5000 [IU] via SUBCUTANEOUS
  Filled 2014-09-16 (×10): qty 1

## 2014-09-16 MED ORDER — ACETAMINOPHEN 325 MG PO TABS: 650.0000 mg | ORAL_TABLET | Freq: Four times a day (QID) | ORAL | Status: DC | PRN

## 2014-09-16 MED ORDER — SODIUM CHLORIDE 0.9 % IV BOLUS (SEPSIS): 1000.0000 mL | INTRAVENOUS | Status: AC

## 2014-09-16 MED ORDER — SODIUM BICARBONATE 8.4 % IV SOLN
INTRAVENOUS | Status: DC
  Filled 2014-09-16: qty 1000

## 2014-09-16 NOTE — ED Notes (Signed)
Pt CBG was 47 mg/dL

## 2014-09-16 NOTE — Progress Notes (Signed)
PROGRESS NOTE    Charles Hendricks ZOX:[id number] DOB: [birth year] DOA: 09-23-2014 PCP: Pearla Dubonnet, MD  Primary Cardiologist: Dr. Verdis Prime  HPI/Brief narrative 81 year old male patient with history of chronic atrial fibrillation-not a candidate for anticoagulation, NICM with EF 35%, chronic combined CHF, cognitive impairment/psychotic disorder, generalized weakness, HTN, CHB s/p PPM - followed by Dr. Ladona Ridgel, CKD, NSVT on PPM interrogation, non-compliance, & frailty, hospitalized 08/20/14-08/30/14 for similar presentation with hypothermia, hypoglycemia and hypotension and was assessed to have acute encephalopathy secondary to sepsis of unclear origin complicating underlying dementia. Initial plans were DO NOT RESUSCITATE and comfort care per family. Patient however improved and was discharged to [facility] health care/SNF. Now transferred back to [facility] on 09-23-2014 with decreased LOC, hypoglycemia and hypothermia. In the ED noted to be in acute on chronic renal failure, hypoglycemic and admitted for possible sepsis again of unclear origin.  Assessment/Plan:  Hypoglycemia - Etiology unclear. - No reported diabetes and not on hypoglycemics - May be secondary to worsened renal functions and poor oral intake. Less likely from adrenal insufficiency (random cortisol 25). - Treat per hypoglycemia protocol - Started D10W IV infusion. - Monitor closely   Possible sepsis - Unclear source - Urine microscopy not impressive for UTI - Chest x-ray: No pneumonia reported  Hypothermia - ? Possible sepsis: Treat as such - Cytogeneticist. Temperature has improved to 41F - TSH mildly elevated at 5.152 but likely not contributing  Acute on stage III chronic kidney disease - Precipitated by dehydration and sepsis - Gentle IV fluids and follow BMP  Hyperkalemia - Secondary to acute on chronic kidney disease and potassium supplements - Resolved after IV fluids and brief bicarbonate  infusion  Hypotension - Secondary to dehydration and sepsis - Continue IV fluids  Acute encephalopathy - Secondary to acute illness complicating underlying moderate dementia - No focal deficits - Treat acute illness and monitor - CT head without acute findings  Chronic atrial fibrillation -Controlled ventricular rate or on demand V paced - Not candidate for anticoagulation due to fall risk - IV metoprolol when necessary as blood pressure tolerates  Lactic acidosis - Secondary to dehydration, hypotension and sepsis - IV fluids - Improving  Acute on chronic combined systolic and diastolic CHF/NICM/CHB s/p PPM - Clinically dehydrated - Gently hydrate IV and follow closely  Right calf and sacral wounds - Not infected - WOC consulted   DO NOT RESUSCITATE    Code Status: DO NOT RESUSCITATE Family Communication: Discussed with patient's granddaughter Miss April Holding) Smith-healthcare decision-maker on 09/16/14. Updated critical condition of patient and ongoing care. She was going to discuss with her family regarding proceeding with aggressive management as we are versus comfort care. Disposition Plan: To be determined.   Consultants:  WOC  Procedures:  None  Antibiotics:  IV vancomycin 5/6 >  IV Zosyn 5/6 >   Subjective: Not much history obtainable from patient secondary to mental status changes. Arousable and oriented only to self. As per nursing this morning, recurrent hypoglycemia and hypothermia. No reported cough, dyspnea, nausea, vomiting or diarrhea.   Objective: Filed Vitals:   09/16/14 0200 09/16/14 0300 09/16/14 0355 09/16/14 0756  BP:   105/87 94/54  Pulse:   69 69  Temp:  97.6 F (36.4 C) 96.8 F (36 C) 95.8 F (35.4 C)  TempSrc:   Rectal Rectal  Resp:   15 11  Height: 5\' 9"  (1.753 m)  6' (1.829 m)   Weight: 62.6 kg (138 lb 0.1 oz)  61.3 kg (135  lb 2.3 oz)   SpO2:  99% 100% 95%    Intake/Output Summary (Last 24 hours) at 09/16/14  1221 Last data filed at 09/16/14 0900  Gross per 24 hour  Intake  632.5 ml  Output      0 ml  Net  632.5 ml   Filed Weights   2014-10-15 2102 09/16/14 0200 09/16/14 0355  Weight: 62.596 kg (138 lb) 62.6 kg (138 lb 0.1 oz) 61.3 kg (135 lb 2.3 oz)     Exam:  General exam: Moderately built and frail elderly male lying comfortably supine in bed  Respiratory system: poor inspiratory effort/decreased breath sounds in the bases but no crackles or rhonchi. Rest of lung fields clear to auscultation. No increased work of breathing. Cardiovascular system: S1 & S2 heard, RRR. No JVD, murmurs, gallops, clicks. 1+ bilateral leg edema/thigh edema/anterior abdominal wall edema. Telemetry SR/on demand V paced, 3-6 beats NSVT 2  Gastrointestinal system: Abdomen is nondistended, soft and nontender. Normal bowel sounds heard. Central nervous system: somnolent but easily arousable and oriented only to self. No focal neurological deficits. Extremities: moves all extremities spontaneously.  Skin: Large superficial ulcer right mid calf and 2 small stage II clean ulcers over right gluteal region. Neither appear infected.   Data Reviewed: Basic Metabolic Panel:  Recent Labs Lab 10/15/2014 2137 10-15-2014 2149 09/16/14 0815  NA 142 142 141  K 5.7* 5.7* 4.6  CL 103 105 108  CO2 20*  --  21*  GLUCOSE 112* 111* 121*  BUN 78* 74* 73*  CREATININE 2.60* 2.50* 2.38*  CALCIUM 8.9  --  8.4*   Liver Function Tests: No results for input(s): AST, ALT, ALKPHOS, BILITOT, PROT, ALBUMIN in the last 168 hours. No results for input(s): LIPASE, AMYLASE in the last 168 hours. No results for input(s): AMMONIA in the last 168 hours. CBC:  Recent Labs Lab 10/15/2014 2137 10-15-2014 2149  WBC 7.6  --   NEUTROABS 6.7  --   HGB 13.0 14.6  HCT 38.9* 43.0  MCV 91.1  --   PLT 156  --    Cardiac Enzymes: No results for input(s): CKTOTAL, CKMB, CKMBINDEX, TROPONINI in the last 168 hours. BNP (last 3 results)  Recent  Labs  10/22/13 1600  PROBNP 3408.0*   CBG:  Recent Labs Lab 09/16/14 0512 09/16/14 0634 09/16/14 0655 09/16/14 0808 09/16/14 0949  GLUCAP 133* 26* 139* 117* 102*    Recent Results (from the past 240 hour(s))  MRSA PCR Screening     Status: Abnormal   Collection Time: 09/16/14  3:40 AM  Result Value Ref Range Status   MRSA by PCR POSITIVE (A) NEGATIVE Final    Comment:        The GeneXpert MRSA Assay (FDA approved for NASAL specimens only), is one component of a comprehensive MRSA colonization surveillance program. It is not intended to diagnose MRSA infection nor to guide or monitor treatment for MRSA infections. RESULT CALLED TO, READ BACK BY AND VERIFIED WITH: WORLEY,L RN [id number] AT 0701 SKEEN,P          Studies: Ct Head Wo Contrast  09/16/2014   CLINICAL DATA:  Altered mental status.  EXAM: CT HEAD WITHOUT CONTRAST  TECHNIQUE: Contiguous axial images were obtained from the base of the skull through the vertex without intravenous contrast.  COMPARISON:  08/24/2014  FINDINGS: There is no evidence of acute cortical infarct, intracranial hemorrhage, mass, midline shift, or extra-axial fluid collection. Encephalomalacia is again seen related to a chronic left MCA  territory infarct about the operculum. There is mild ex vacuo dilatation of the left lateral ventricle. Periventricular white-matter hypodensities are similar to the prior study and nonspecific but compatible with chronic small vessel ischemic disease.  Orbits are unremarkable. Mastoid air cells and visualized paranasal sinuses are clear. Atherosclerotic calcification is noted at the skull base.  IMPRESSION: 1. No evidence of acute intracranial abnormality. 2. Chronic small vessel ischemic disease and chronic left MCA infarct.   Electronically Signed   By: Sebastian Ache   On: 09/16/2014 00:33   Dg Chest Portable 1 View  09/16/2014   CLINICAL DATA:  Altered mental status  EXAM: PORTABLE CHEST - 1 VIEW  COMPARISON:   08/23/2014  FINDINGS: There is marked unchanged cardiomegaly per there are intact appearances of the transvenous lead. There is right pleural fluid or pleural thickening, unchanged. No dense airspace consolidation is evident.  IMPRESSION: Marked unchanged cardiomegaly. Right pleural thickening or pleural fluid.   Electronically Signed   By: Ellery Plunk M.D.   On: 09/16/2014 02:01        Scheduled Meds: . antiseptic oral rinse  7 mL Mouth Rinse BID  . Chlorhexidine Gluconate Cloth  6 each Topical Q0600  . heparin  5,000 Units Subcutaneous 3 times per day  . mupirocin ointment  1 application Nasal BID  . piperacillin-tazobactam (ZOSYN)  IV  2.25 g Intravenous Q6H  . [START ON 09/18/2014] vancomycin  1,000 mg Intravenous Q48H   Continuous Infusions: . dextrose 125 mL/hr at 09/16/14 7829    Active Problems:   Chronic atrial fibrillation   CKD (chronic kidney disease), stage III   Acute on chronic systolic and diastolic heart failure, NYHA class 4   AKI (acute kidney injury)   Acute encephalopathy   Hypoglycemia   Hypothermia   Lactic acidosis   Hyperkalemia   Hypotension   Sepsis    Time spent: 50 minutes.    Marcellus Scott, MD, FACP, FHM. [facility] Pager [phone]  If 7PM-7AM, please contact night-coverage [email or web address] Password [email or web address] 09/16/2014, 12:21 PM    LOS: 0 days

## 2014-09-16 NOTE — Progress Notes (Signed)
ANTIBIOTIC CONSULT NOTE - INITIAL  Pharmacy Consult for Vancocin and Zosyn Indication: rule out sepsis  Allergies  Allergen Reactions  . Asa [Aspirin] Other (See Comments)    Told not to take med-per MD    Patient Measurements: Height:  (175.3 cm) Weight: 138 lb 0.1 oz (62.6 kg) IBW/kg (Calculated) : 70.7  Vital Signs: Temp: 97 F (36.1 C) (05/06 2302) Temp Source: Rectal (05/06 2302) BP: 99/60 mmHg (05/07 0032) Pulse Rate: 77 (05/07 0032)  Labs:  Recent Labs  09/13/2014 2137 09/23/2014 2149  WBC 7.6  --   HGB 13.0 14.6  PLT 156  --   CREATININE 2.60* 2.50*   Estimated Creatinine Clearance: 18.1 mL/min (by C-G formula based on Cr of 2.5). No results for input(s): VANCOTROUGH, VANCOPEAK, VANCORANDOM, GENTTROUGH, GENTPEAK, GENTRANDOM, TOBRATROUGH, TOBRAPEAK, TOBRARND, AMIKACINPEAK, AMIKACINTROU, AMIKACIN in the last 72 hours.   Microbiology: Recent Results (from the past 720 hour(s))  Urine culture     Status: None   Collection Time: 08/21/14 12:39 AM  Result Value Ref Range Status   Specimen Description URINE, CATHETERIZED  Final   Special Requests NONE  Final   Colony Count NO GROWTH Performed at [facility]   Final   Culture NO GROWTH Performed at [facility]   Final   Report Status 08/22/2014 FINAL  Final  MRSA PCR Screening     Status: None   Collection Time: 08/21/14  1:13 AM  Result Value Ref Range Status   MRSA by PCR NEGATIVE NEGATIVE Final    Comment:        The GeneXpert MRSA Assay (FDA approved for NASAL specimens only), is one component of a comprehensive MRSA colonization surveillance program. It is not intended to diagnose MRSA infection nor to guide or monitor treatment for MRSA infections.   Blood culture (routine x 2)     Status: None   Collection Time: 08/21/14  2:20 AM  Result Value Ref Range Status   Specimen Description BLOOD LEFT ARM  Final   Special Requests BOTTLES DRAWN AEROBIC ONLY 1CC  Final   Culture    Final    NO GROWTH 5 DAYS Performed at [facility]    Report Status 08/27/2014 FINAL  Final  Culture, blood (routine x 2)     Status: None   Collection Time: 08/21/14  4:19 AM  Result Value Ref Range Status   Specimen Description BLOOD LEFT HAND  Final   Special Requests BOTTLES DRAWN AEROBIC ONLY 3CC  Final   Culture   Final    NO GROWTH 5 DAYS Performed at [facility]    Report Status 08/27/2014 FINAL  Final    Medical History: Past Medical History  Diagnosis Date  . Bradycardia   . Chronic atrial fibrillation     Sick Sinus syndrome 1998  . Complete heart block     a. Initial placement 2005. s/p Medtronic pacemaker gen change 2013.  Marland Kitchen HTN (hypertension), benign   . Pacemaker-Medtronic   . Non-ischemic cardiomyopathy     last EF 35% by Echo at [facility] 2008  . Gout   . Elevated PSA     asymptomatic  . Corns and callosities   . Cognitive impairment     a. Taken off Coumadin due to this.  . Chronic combined systolic and diastolic CHF (congestive heart failure)   . CKD (chronic kidney disease), stage III     Assessment: 81yo male presents via EMS from NH for AMS  and agitation followed by moaning, found to be hypoglycemic, no fever but concern for sepsis as has had similar episodes in past w/ admission, to begin IV ABX.  Goal of Therapy:  Vancomycin trough level 15-20 mcg/ml  Plan:  Vanc 1g and Zosyn 3.375g has been ordered to be given in ED; will continue with vancomycin 1000mg  IV Q48H and Zosyn 2.25g IV Q6H and monitor CBC, Cx, levels prn.  Vernard Gambles, PharmD, BCPS  09/16/2014,2:37 AM

## 2014-09-16 NOTE — H&P (Addendum)
[facility] Admission History and Physical       Charles Hendricks DHW:[id number] DOB: 28-Dec-1924 DOA: 19-Sep-2014    Referring physician: EDP  PCP: Pearla Dubonnet, MD  Specialists:   Chief Complaint: Decreased LOC  HPI: Charles Hendricks is a 81 y.o. male with a history of Chronic Atrial Fibrillation, Non-Ischemeic Cardiomyopathy with EF =35% on ECHO 2008,  Sick sinus and Complete Heart  Block S/P Pacemaker, Chronic Combined Stage III CHF,  Stage III CKD , and Dementia who resides at the [facility] and was sent to the ED due to decreased LOC, he was reported as being different from his baseline.   EMS found his glucose level to be 47, and he was administered IV Dextrose, and in the ED he continued to have decreasing blood glucose levels and was placed on IVFs with IV Dextrose, and administered IV Dextrose per the Hypoglycemic Protocol.   He was also found to have an elevation in his BUIN/Cr, and a Lactic Acidosis as well as Hyperkalemia. He was referred for medical admission.    His last admission on 08/21/2014 was due to a similar presentation.     Review of Systems: Unable to Obtain from the Patient   Past Medical History  Diagnosis Date  . Bradycardia   . Chronic atrial fibrillation     Sick Sinus syndrome 1998  . Complete heart block     a. Initial placement 2005. s/p Medtronic pacemaker gen change 2013.  Marland Kitchen HTN (hypertension), benign   . Pacemaker-Medtronic   . Non-ischemic cardiomyopathy     last EF 35% by Echo at [facility] 2008  . Gout   . Elevated PSA     asymptomatic  . Corns and callosities   . Cognitive impairment     a. Taken off Coumadin due to this.  . Chronic combined systolic and diastolic CHF (congestive heart failure)   . CKD (chronic kidney disease), stage III      Past Surgical History  Procedure Laterality Date  . Pacemaker insertion  1998    power source change 2005  . Permanent pacemaker generator change N/A 03/05/2012    Procedure:  PERMANENT PACEMAKER GENERATOR CHANGE;  Surgeon: Marinus Maw, MD;  Location: [facility] CATH LAB;  Service: Cardiovascular;  Laterality: N/A;      Prior to Admission medications   Medication Sig Start Date End Date Taking? Authorizing Provider  furosemide (LASIX) 40 MG tablet Take 1.5 tablets (60 mg total) by mouth 2 (two) times daily. 1 tablet early a.m. and one tablet midafternoon. 09/08/14   Rosalio Macadamia, NP  Magnesium Oxide 400 MG CAPS Take 1 capsule (400 mg total) by mouth 2 (two) times daily. 08/30/14   Ripudeep Jenna Luo, MD  metoprolol succinate (TOPROL-XL) 50 MG 24 hr tablet Take 1 tablet (50 mg total) by mouth daily. Take with or immediately following a meal. 07/19/14   Rhonda G Barrett, PA-C  potassium chloride SA (K-DUR,KLOR-CON) 20 MEQ tablet Take 1 tablet (20 mEq total) by mouth 2 (two) times daily. 09/08/14   Rosalio Macadamia, NP     Allergies  Allergen Reactions  . Asa [Aspirin] Other (See Comments)    Told not to take med-per MD    Social History:      reports that he has quit smoking. He does not have any smokeless tobacco history on file. He reports that he does not drink alcohol. His drug history is not on file.    Family History  Problem  Relation Age of Onset  . Heart failure Mother        Physical Exam:  GEN:  Pleasant Elderly Cachectic 81 y.o. African American male examined and in no acute distress; cooperative with exam Filed Vitals:   2014-09-20 2302 20-Sep-2014 2330 09/16/14 0000 09/16/14 0032  BP:  110/84 111/76 99/60  Pulse:   80 77  Temp: 97 F (36.1 C)     TempSrc: Rectal     Resp:  Weight:      SpO2:  99% 98% 100%   Blood pressure 99/60, pulse 77, temperature 97 F (36.1 C), temperature source Rectal, resp. rate 17, weight 62.596 kg (138 lb), SpO2 100 %. PSYCH: He is alert and oriented x 0; does not appear anxious does not appear depressed; affect is normal HEENT: Normocephalic and Atraumatic, Mucous membranes pink; PERRLA; EOM intact; Fundi:   Benign;  No scleral icterus, Nares: Patent, Oropharynx: Clear, Edentulous,    Neck:  FROM, No Cervical Lymphadenopathy nor Thyromegaly or Carotid Bruit; No JVD; Breasts:: Not examined CHEST WALL: No tenderness CHEST: Normal respiration, clear to auscultation bilaterally HEART: Irregular rate and rhythm; no murmurs rubs or gallops BACK: No kyphosis or scoliosis; No CVA tenderness ABDOMEN: Positive Bowel Sounds, Scaphoid, Soft Non-Tender, No Rebound or Guarding; No Masses, No Organomegaly Rectal Exam: Not done EXTREMITIES: +3 BLE Edema Genitalia: not examined PULSES: 2+ and symmetric SKIN: Normal hydration no rash or ulceration CNS:  Alert and Oriented x o, Obtunded, Generalized Weakness, 4, No Focal Deficits Vascular: pulses palpable throughout    Labs on Admission:  Basic Metabolic Panel:  Recent Labs Lab 20-Sep-2014 2137 Sep 20, 2014 2149  NA 142 142  K 5.7* 5.7*  CL 103 105  CO2 20*  --   GLUCOSE 112* 111*  BUN 78* 74*  CREATININE 2.60* 2.50*  CALCIUM 8.9  --    Liver Function Tests: No results for input(s): AST, ALT, ALKPHOS, BILITOT, PROT, ALBUMIN in the last 168 hours. No results for input(s): LIPASE, AMYLASE in the last 168 hours. No results for input(s): AMMONIA in the last 168 hours. CBC:  Recent Labs Lab 09/20/14 2137 09/20/2014 2149  WBC 7.6  --   NEUTROABS 6.7  --   HGB 13.0 14.6  HCT 38.9* 43.0  MCV 91.1  --   PLT 156  --    Cardiac Enzymes: No results for input(s): CKTOTAL, CKMB, CKMBINDEX, TROPONINI in the last 168 hours.  BNP (last 3 results)  Recent Labs  07/15/14 1531 08/27/14 1807  BNP 1049.7* 1117.6*    ProBNP (last 3 results)  Recent Labs  10/22/13 1600  PROBNP 3408.0*    CBG:  Recent Labs Lab 09/16/14 0043 09/16/14 0134  GLUCAP 47* 27*    Radiological Exams on Admission: Ct Head Wo Contrast  09/16/2014   CLINICAL DATA:  Altered mental status.  EXAM: CT HEAD WITHOUT CONTRAST  TECHNIQUE: Contiguous axial images were obtained  from the base of the skull through the vertex without intravenous contrast.  COMPARISON:  08/24/2014  FINDINGS: There is no evidence of acute cortical infarct, intracranial hemorrhage, mass, midline shift, or extra-axial fluid collection. Encephalomalacia is again seen related to a chronic left MCA territory infarct about the operculum. There is mild ex vacuo dilatation of the left lateral ventricle. Periventricular white-matter hypodensities are similar to the prior study and nonspecific but compatible with chronic small vessel ischemic disease.  Orbits are unremarkable. Mastoid air cells and visualized paranasal sinuses are clear. Atherosclerotic calcification is noted  at the skull base.  IMPRESSION: 1. No evidence of acute intracranial abnormality. 2. Chronic small vessel ischemic disease and chronic left MCA infarct.   Electronically Signed   By: Sebastian Ache   On: 09/16/2014 00:33     EKG: Independently reviewed. Atrial Fibrillation rate = 83.      Assessment/Plan:   81 y.o. male with  Active Problems:   1.    Hypoglycemia   Placed on IV D10   Monitor Glucose Levels hourly    Follow Hypoglycemic Protocol   Check Cortisol Level      2.    AKI (acute kidney injury)   Gentle IVFs   Monitor BUN/Cr     3.    Acute encephalopathy- Metabolic, due to #1,       4.    Lactic acidosis- due to Metabolic Acidosis From #7,  or Sepsis   Sepsis Workup initiated    Blood and Urine Cultures ordered    IV Vancomycin and Zosyn    IVFs    5.    Hyperkalemia   IVFs   Discontinue Supplemental KCl and Magnesium   Monitor K+levels     6.    Hypotension- Sepsis Workup initiated   IVFs   Hold Anti-Hypertensives   Check Cortisol Level     7.    Chronic Atrial fibrillation- Not a Coumadin Candidate due to falls   IV Lopressor PRN for rate Control if BP Tolerates       8.    CKD (chronic kidney disease), stage III- on discharge BUN/Cr was 54/1.78 on 09/07/2014   Monitor BUN/Cr     9.     Acute on chronic systolic and diastolic heart failure, NYHA class 4   Avoid Fluid Overlaod    10.     DVT Prophylaxis    SQ Heparin            Code Status:     DO NOT RESUSCITATE (DNR)      Family Communication:    No Family Present    Disposition Plan:    Inpatient Status       Time spent: 84 Minutes      Ron Parker [facility] Pager [phone]   If 7AM -7PM Please Contact the Day Rounding Team MD for [facility]  If 7PM-7AM, Please Contact Night-Floor Coverage  [email or web address] Password [email or web address] 09/16/2014, 1:46 AM     ADDENDUM:   Patient was seen and examined on 09/16/2014

## 2014-09-16 NOTE — Progress Notes (Signed)
UR COMPLETED  

## 2014-09-16 NOTE — Progress Notes (Signed)
Unable to obtain pulse oximetry to read on pt, Multiple sites tried along with portable equipment. Pt does not appear to be in any respiratory distress. Will continue to monitor.

## 2014-09-16 NOTE — Progress Notes (Signed)
Hypoglycemic Event  CBG: 26  Treatment: D50 IV 25 mL  Symptoms: None  Follow-up CBG: Time:0655 CBG Result:139  Possible Reasons for Event: Unknown  Comments/MD notified: MD notified. No new orders.    Charles Hendricks F  Remember to initiate Hypoglycemia Order Set & complete

## 2014-09-16 NOTE — ED Notes (Signed)
Pt CBG was 241 mg/dL

## 2014-09-16 NOTE — Progress Notes (Signed)
Addendum  Patient's granddaughter Miss Charles Hendricks called back. She discussed with her family and they have decided to continue with current aggressive line of care.  Charles Scott, MD, FACP, FHM. [facility] Pager [phone]  If 7PM-7AM, please contact night-coverage [email or web address] Password [email or web address] 09/16/2014, 4:22 PM

## 2014-09-16 NOTE — ED Provider Notes (Signed)
Case is discussed with Dr. Lovell Sheehan of [facility]. Patient be admitted to stepdown unit for careful monitoring of blood pressure, aggressive rehydration, and monitoring of blood sugars.  Dione Booze, MD 09/16/14 [phone]

## 2014-09-16 NOTE — ED Notes (Signed)
Patient transported to CT, 

## 2014-09-17 DIAGNOSIS — N189 Chronic kidney disease, unspecified: Secondary | ICD-10-CM

## 2014-09-17 LAB — CBC
HCT: 36.1 % — ABNORMAL LOW (ref 39.0–52.0)
HEMATOCRIT: 35.1 % — AB (ref 39.0–52.0)
HEMOGLOBIN: 11.4 g/dL — AB (ref 13.0–17.0)
Hemoglobin: 12.2 g/dL — ABNORMAL LOW (ref 13.0–17.0)
MCH: 29.8 pg (ref 26.0–34.0)
MCH: 30.2 pg (ref 26.0–34.0)
MCHC: 32.5 g/dL (ref 30.0–36.0)
MCHC: 33.8 g/dL (ref 30.0–36.0)
MCV: 91.6 fL (ref 78.0–100.0)
MCV: 89.4 fL (ref 78.0–100.0)
Platelets: 128 10*3/uL — ABNORMAL LOW (ref 150–400)
Platelets: 137 10*3/uL — ABNORMAL LOW (ref 150–400)
RBC: 3.83 MIL/uL — AB (ref 4.22–5.81)
RBC: 4.04 MIL/uL — ABNORMAL LOW (ref 4.22–5.81)
RDW: 16.4 % — ABNORMAL HIGH (ref 11.5–15.5)
RDW: 15.8 % — ABNORMAL HIGH (ref 11.5–15.5)
WBC: 8 10*3/uL (ref 4.0–10.5)
WBC: 8.9 10*3/uL (ref 4.0–10.5)

## 2014-09-17 LAB — URINE CULTURE
Colony Count: NO GROWTH
Culture: NO GROWTH

## 2014-09-17 LAB — BASIC METABOLIC PANEL
ANION GAP: 16 — AB (ref 5–15)
ANION GAP: 14 (ref 5–15)
Anion gap: 13 (ref 5–15)
BUN: 59 mg/dL — AB (ref 6–20)
BUN: 56 mg/dL — ABNORMAL HIGH (ref 6–20)
BUN: 54 mg/dL — ABNORMAL HIGH (ref 6–20)
CALCIUM: 8.1 mg/dL — AB (ref 8.9–10.3)
CHLORIDE: 98 mmol/L — AB (ref 101–111)
CO2: 21 mmol/L — ABNORMAL LOW (ref 22–32)
CO2: 24 mmol/L (ref 22–32)
CO2: 23 mmol/L (ref 22–32)
CREATININE: 2.35 mg/dL — AB (ref 0.61–1.24)
CREATININE: 2.35 mg/dL — AB (ref 0.61–1.24)
Calcium: 8.2 mg/dL — ABNORMAL LOW (ref 8.9–10.3)
Calcium: 8.1 mg/dL — ABNORMAL LOW (ref 8.9–10.3)
Chloride: 102 mmol/L (ref 101–111)
Chloride: 100 mmol/L — ABNORMAL LOW (ref 101–111)
Creatinine, Ser: 2.29 mg/dL — ABNORMAL HIGH (ref 0.61–1.24)
GFR calc Af Amer: 27 mL/min — ABNORMAL LOW (ref >60)
GFR calc Af Amer: 28 mL/min — ABNORMAL LOW (ref >60)
GFR calc non Af Amer: 23 mL/min — ABNORMAL LOW (ref >60)
GFR calc non Af Amer: 23 mL/min — ABNORMAL LOW (ref >60)
GFR calc non Af Amer: 24 mL/min — ABNORMAL LOW (ref >60)
GFR, EST AFRICAN AMERICAN: 27 mL/min — AB (ref >60)
GLUCOSE: 150 mg/dL — AB (ref 70–99)
Glucose, Bld: 98 mg/dL (ref 70–99)
Glucose, Bld: 138 mg/dL — ABNORMAL HIGH (ref 70–99)
POTASSIUM: 3.9 mmol/L (ref 3.5–5.1)
Potassium: 3.9 mmol/L (ref 3.5–5.1)
Potassium: 4 mmol/L (ref 3.5–5.1)
Sodium: 139 mmol/L (ref 135–145)
Sodium: 136 mmol/L (ref 135–145)
Sodium: 136 mmol/L (ref 135–145)

## 2014-09-17 LAB — COMPREHENSIVE METABOLIC PANEL
ALT: 38 U/L (ref 17–63)
ANION GAP: 12 (ref 5–15)
AST: 43 U/L — AB (ref 15–41)
Albumin: 2.2 g/dL — ABNORMAL LOW (ref 3.5–5.0)
Alkaline Phosphatase: 95 U/L (ref 38–126)
BILIRUBIN TOTAL: 1.6 mg/dL — AB (ref 0.3–1.2)
BUN: 61 mg/dL — ABNORMAL HIGH (ref 6–20)
CHLORIDE: 100 mmol/L — AB (ref 101–111)
CO2: 26 mmol/L (ref 22–32)
Calcium: 8.3 mg/dL — ABNORMAL LOW (ref 8.9–10.3)
Creatinine, Ser: 2.4 mg/dL — ABNORMAL HIGH (ref 0.61–1.24)
GFR, EST AFRICAN AMERICAN: 26 mL/min — AB (ref >60)
GFR, EST NON AFRICAN AMERICAN: 23 mL/min — AB (ref >60)
Glucose, Bld: 236 mg/dL — ABNORMAL HIGH (ref 70–99)
Potassium: 3.5 mmol/L (ref 3.5–5.1)
Sodium: 138 mmol/L (ref 135–145)
Total Protein: 5.3 g/dL — ABNORMAL LOW (ref 6.5–8.1)

## 2014-09-17 LAB — GLUCOSE, CAPILLARY
GLUCOSE-CAPILLARY: 43 mg/dL — AB (ref 70–99)
Glucose-Capillary: 142 mg/dL — ABNORMAL HIGH (ref 70–99)
Glucose-Capillary: 83 mg/dL (ref 70–99)
Glucose-Capillary: 36 mg/dL — CL (ref 70–99)
Glucose-Capillary: 45 mg/dL — ABNORMAL LOW (ref 70–99)
Glucose-Capillary: 47 mg/dL — ABNORMAL LOW (ref 70–99)
Glucose-Capillary: 83 mg/dL (ref 70–99)
Glucose-Capillary: 71 mg/dL (ref 70–99)
Glucose-Capillary: 110 mg/dL — ABNORMAL HIGH (ref 70–99)

## 2014-09-17 LAB — LACTIC ACID, PLASMA: Lactic Acid, Venous: 3 mmol/L (ref 0.5–2.0)

## 2014-09-17 MED ORDER — OXYMETAZOLINE HCL 0.05 % NA SOLN
1.0000 | Freq: Two times a day (BID) | NASAL | Status: DC
  Administered 2014-09-17: 1 via NASAL
  Filled 2014-09-17: qty 15

## 2014-09-17 MED ORDER — HALOPERIDOL LACTATE 5 MG/ML IJ SOLN
2.5000 mg | Freq: Once | INTRAMUSCULAR | Status: AC
  Administered 2014-09-17: 2.5 mg via INTRAVENOUS

## 2014-09-17 MED ORDER — HALOPERIDOL LACTATE 5 MG/ML IJ SOLN
INTRAMUSCULAR | Status: AC
  Filled 2014-09-17: qty 1

## 2014-09-17 MED ORDER — MORPHINE SULFATE 2 MG/ML IJ SOLN
0.5000 mg | Freq: Three times a day (TID) | INTRAMUSCULAR | Status: DC | PRN
  Administered 2014-09-18: 0.5 mg via INTRAVENOUS
  Filled 2014-09-17: qty 1

## 2014-09-17 MED ORDER — DEXTROSE 50 % IV SOLN
INTRAVENOUS | Status: AC
  Administered 2014-09-17: 25 mL
  Filled 2014-09-17: qty 50

## 2014-09-17 MED ORDER — METOPROLOL TARTRATE 1 MG/ML IV SOLN: 2.5000 mg | Freq: Once | INTRAVENOUS | Status: DC

## 2014-09-17 MED ORDER — FUROSEMIDE 10 MG/ML IJ SOLN
40.0000 mg | Freq: Once | INTRAMUSCULAR | Status: AC
  Administered 2014-09-17: 40 mg via INTRAVENOUS

## 2014-09-17 MED ORDER — DEXTROSE 10 % IV SOLN: INTRAVENOUS | Status: DC

## 2014-09-17 NOTE — Progress Notes (Signed)
Went patients room found his condom cath off, replaced condom cath. Then noticed patient was trickling  Blood from his right nare held pressure for about 5 minute blood increased.  Packed patients right nare with 2 x2 gauze pt within  40 seconds saturated gauze.  Page  Kirtland Bouchard Schoor to notify she order afrin spray repack nose and ice over the bridge of the nose. Completed orders pt,s nose appears to have stopped bleeding. Pt swallowed a moderate amount of blood was suctioned and mouth care performed re paged K Schorr to update will continue to monitor.

## 2014-09-17 NOTE — Progress Notes (Signed)
Restraints were never used or needed therefore order was d/cd

## 2014-09-17 NOTE — Progress Notes (Addendum)
PROGRESS NOTE    Charles Hendricks WUJ:[id number] DOB: Jul 18, 1924 DOA: 09/13/2014 PCP: Pearla Dubonnet, MD  Primary Cardiologist: Dr. Verdis Prime  HPI/Brief narrative 81 year old male patient with history of chronic atrial fibrillation-not a candidate for anticoagulation, NICM with EF 35%, chronic combined CHF, cognitive impairment/psychotic disorder, generalized weakness, HTN, CHB s/p PPM - followed by Dr. Ladona Ridgel, CKD, NSVT on PPM interrogation, non-compliance, & frailty, hospitalized 08/20/14-08/30/14 for similar presentation with hypothermia, hypoglycemia and hypotension and was assessed to have acute encephalopathy secondary to sepsis of unclear origin complicating underlying dementia. Initial plans were DO NOT RESUSCITATE and comfort care per family. Patient however improved and was discharged to [facility] health care/SNF. Now transferred back to [facility] on 09/10/2014 with decreased LOC, hypoglycemia and hypothermia. In the ED noted to be in acute on chronic renal failure, hypoglycemic and admitted for possible sepsis again of unclear origin.  Assessment/Plan:  Hypoglycemia - Etiology unclear. - No reported diabetes and not on hypoglycemics - May be secondary to worsened renal functions and poor oral intake. Less likely from adrenal insufficiency (random cortisol 25). - Treat per hypoglycemia protocol - Started D10W IV infusion. - on 5/8, noted CBGs in the 36-45 MG per DL range despite BMP showing glucose of 236 earlier. A repeated BMP shows glucose 150. Discrepancy exists between CBG checks and BMP? Etiology. - Check BMP every 12  Possible sepsis - Unclear source - Urine microscopy not impressive for UTI - Chest x-ray: No pneumonia reported - Blood cultures 2: Negative to date. - Urine culture: Negative  Hypothermia - ? Possible sepsis: Treat as such - TSH mildly elevated at 5.152 but likely not contributing - Resolved   Acute on stage III chronic kidney disease - Precipitated by  dehydration and sepsis - Gentle IV fluids and follow BMP - Creatinine has not changed in the last 24 hours. May be close to baseline. - Baseline creatinine may be in the 1. 7-2 range.  Hyperkalemia - Secondary to acute on chronic kidney disease and potassium supplements - Resolved after IV fluids and brief bicarbonate infusion  Hypotension - Secondary to dehydration and sepsis - Improved. Now getting volume overloaded and weeping from his arms. Reduce IV fluids   Acute encephalopathy - Secondary to acute illness complicating underlying moderate dementia - No focal deficits - Treat acute illness and monitor - CT head without acute findings - Mental status not improving and may be declining.  Chronic atrial fibrillation -Controlled ventricular rate or on demand V paced - Not candidate for anticoagulation due to fall risk - IV metoprolol when necessary as blood pressure tolerates  Lactic acidosis - Secondary to dehydration, hypotension and sepsis - IV fluids - Improving  Acute on chronic combined systolic and diastolic CHF/NICM/CHB s/p PPM - Clinically dehydrated -Hydrated gently with IV fluids and now seems to be getting volume overloaded. Cut back on IV fluids.   Right calf, sacraland right hip  wounds - Not infected - WOC consulted   DO NOT RESUSCITATE    Code Status: DO NOT RESUSCITATE Family Communication: Discussed with patient's granddaughter Miss Joni Reining Jodene Nam) Smith-healthcare decision-maker on 09/17/14. Family had decided aggressive care yesterday. Advised her that he has not made significant improvement and is actually declining. She stated that she was going to discuss with her family. Disposition Plan: To be determined.   Consultants:  WOC  Procedures:  None  Antibiotics:  IV vancomycin 5/6 >  IV Zosyn 5/6 >   Subjective: Nonverbal. As per nursing, patient intermittently moans but no  specific location for pain identified. Also noted to be  drooling this afternoon.  Objective: Filed Vitals:   09/17/14 0031 09/17/14 0435 09/17/14 0820 09/17/14 1137  BP: 126/90 116/79 100/72   Pulse: 72 78 92   Temp: 97.6 F (36.4 C) 98.2 F (36.8 C) 97.7 F (36.5 C) 97.5 F (36.4 C)  TempSrc: Axillary Axillary Axillary Oral  Resp: 18 10 16    Height:      Weight:      SpO2: 98% 97% 94%     Intake/Output Summary (Last 24 hours) at 09/17/14 1334 Last data filed at 09/17/14 1138  Gross per 24 hour  Intake 2639.58 ml  Output    475 ml  Net 2164.58 ml   Filed Weights   10/07/2014 2102 09/16/14 0200 09/16/14 0355  Weight: 62.596 kg (138 lb) 62.6 kg (138 lb 0.1 oz) 61.3 kg (135 lb 2.3 oz)     Exam:  General exam: Moderately built and frail elderly male, chronically ill-looking lying in fetal position in no obvious distress this morning.  Respiratory system: poor inspiratory effort/decreased breath sounds in the bases but no crackles or rhonchi. Rest of lung fields clear to auscultation. No increased work of breathing. Cardiovascular system: S1 & S2 heard, RRR. No JVD, murmurs, gallops, clicks. 1+ bilateral leg edema/thigh edema/anterior abdominal wall edema. Telemetry: A. fib with controlled ventricular rate. Occasional bradycardia in the 50s. Occasional pacemaker spikes. 2 episodes of 3 and 10 beats NSVT. Gastrointestinal system: Abdomen is nondistended, soft and nontender. Normal bowel sounds heard. Central nervous system: somnolent but easily arousable and not oriented. No focal neurological deficits.Does not follow instructions.  Extremities: moves all extremities spontaneously.  Skin: Large superficial ulcer right mid calf and 2 small stage II clean ulcers over right gluteal region. Neither appear infected.Approximately 2 cm diameter right hip stage II ulcer with tan base and no other acute findings   Data Reviewed: Basic Metabolic Panel:  Recent Labs Lab 09/25/2014 2137 09/30/2014 2149 09/16/14 0815 09/17/14 0250 09/17/14 1002   NA 142 142 141 138 139  K 5.7* 5.7* 4.6 3.5 3.9  CL 103 105 108 100* 102  CO2 20*  --  21* 26 21*  GLUCOSE 112* 111* 121* 236* 150*  BUN 78* 74* 73* 61* 59*  CREATININE 2.60* 2.50* 2.38* 2.40* 2.35*  CALCIUM 8.9  --  8.4* 8.3* 8.2*   Liver Function Tests:  Recent Labs Lab 09/17/14 0250  AST 43*  ALT 38  ALKPHOS 95  BILITOT 1.6*  PROT 5.3*  ALBUMIN 2.2*   No results for input(s): LIPASE, AMYLASE in the last 168 hours. No results for input(s): AMMONIA in the last 168 hours. CBC:  Recent Labs Lab 10/10/2014 2137 09/22/2014 2149 09/16/14 1132 09/17/14 0250  WBC 7.6  --  7.7 8.0  NEUTROABS 6.7  --   --   --   HGB 13.0 14.6 11.6* 11.4*  HCT 38.9* 43.0 35.5* 35.1*  MCV 91.1  --  91.0 91.6  PLT 156  --  150 128*   Cardiac Enzymes: No results for input(s): CKTOTAL, CKMB, CKMBINDEX, TROPONINI in the last 168 hours. BNP (last 3 results)  Recent Labs  10/22/13 1600  PROBNP 3408.0*   CBG:  Recent Labs Lab 09/16/14 2039 09/17/14 0030 09/17/14 0442 09/17/14 0905 09/17/14 0907  GLUCAP 195* 142* 83 45* 36*    Recent Results (from the past 240 hour(s))  Urine culture     Status: None   Collection Time: 10/04/2014 11:09 PM  Result Value Ref Range Status   Specimen Description URINE, CATHETERIZED  Final   Special Requests NONE  Final   Colony Count NO GROWTH Performed at [facility]   Final   Culture NO GROWTH Performed at [facility]   Final   Report Status 09/17/2014 FINAL  Final  Culture, blood (x 2)     Status: None (Preliminary result)   Collection Time: 09/16/14  2:54 AM  Result Value Ref Range Status   Specimen Description BLOOD RIGHT HAND  Final   Special Requests BOTTLES DRAWN AEROBIC ONLY 1CC  Final   Culture   Final           BLOOD CULTURE RECEIVED NO GROWTH TO DATE CULTURE WILL BE HELD FOR 5 DAYS BEFORE ISSUING A FINAL NEGATIVE REPORT Performed at [facility]    Report Status PENDING  Incomplete  MRSA PCR Screening      Status: Abnormal   Collection Time: 09/16/14  3:40 AM  Result Value Ref Range Status   MRSA by PCR POSITIVE (A) NEGATIVE Final    Comment:        The GeneXpert MRSA Assay (FDA approved for NASAL specimens only), is one component of a comprehensive MRSA colonization surveillance program. It is not intended to diagnose MRSA infection nor to guide or monitor treatment for MRSA infections. RESULT CALLED TO, READ BACK BY AND VERIFIED WITH: WORLEY,L RN [phone] AT 0701 SKEEN,P   Culture, blood (x 2)     Status: None (Preliminary result)   Collection Time: 09/16/14  8:15 AM  Result Value Ref Range Status   Specimen Description BLOOD RIGHT ANTECUBITAL  Final   Special Requests BOTTLES DRAWN AEROBIC ONLY 3CC  Final   Culture   Final           BLOOD CULTURE RECEIVED NO GROWTH TO DATE CULTURE WILL BE HELD FOR 5 DAYS BEFORE ISSUING A FINAL NEGATIVE REPORT Performed at [facility]    Report Status PENDING  Incomplete         Studies: Ct Head Wo Contrast  09/16/2014   CLINICAL DATA:  Altered mental status.  EXAM: CT HEAD WITHOUT CONTRAST  TECHNIQUE: Contiguous axial images were obtained from the base of the skull through the vertex without intravenous contrast.  COMPARISON:  08/24/2014  FINDINGS: There is no evidence of acute cortical infarct, intracranial hemorrhage, mass, midline shift, or extra-axial fluid collection. Encephalomalacia is again seen related to a chronic left MCA territory infarct about the operculum. There is mild ex vacuo dilatation of the left lateral ventricle. Periventricular white-matter hypodensities are similar to the prior study and nonspecific but compatible with chronic small vessel ischemic disease.  Orbits are unremarkable. Mastoid air cells and visualized paranasal sinuses are clear. Atherosclerotic calcification is noted at the skull base.  IMPRESSION: 1. No evidence of acute intracranial abnormality. 2. Chronic small vessel ischemic disease and chronic  left MCA infarct.   Electronically Signed   By: Sebastian Ache   On: 09/16/2014 00:33   Dg Chest Portable 1 View  09/16/2014   CLINICAL DATA:  Altered mental status  EXAM: PORTABLE CHEST - 1 VIEW  COMPARISON:  08/23/2014  FINDINGS: There is marked unchanged cardiomegaly per there are intact appearances of the transvenous lead. There is right pleural fluid or pleural thickening, unchanged. No dense airspace consolidation is evident.  IMPRESSION: Marked unchanged cardiomegaly. Right pleural thickening or pleural fluid.   Electronically Signed   By: Rosey Bath.D.  On: 09/16/2014 02:01        Scheduled Meds: . antiseptic oral rinse  7 mL Mouth Rinse BID  . Chlorhexidine Gluconate Cloth  6 each Topical Q0600  . heparin  5,000 Units Subcutaneous 3 times per day  . mupirocin ointment  1 application Nasal BID  . piperacillin-tazobactam (ZOSYN)  IV  2.25 g Intravenous Q6H  . [START ON 09/18/2014] vancomycin  1,000 mg Intravenous Q48H   Continuous Infusions:    Active Problems:   Chronic atrial fibrillation   CKD (chronic kidney disease), stage III   Acute on chronic systolic and diastolic heart failure, NYHA class 4   AKI (acute kidney injury)   Acute encephalopathy   Hypoglycemia   Hypothermia   Lactic acidosis   Hyperkalemia   Hypotension   Sepsis    Time spent: 50 minutes.    Marcellus Scott, MD, FACP, FHM. [facility] Pager [phone]  If 7PM-7AM, please contact night-coverage [email or web address] Password [email or web address] 09/17/2014, 1:34 PM    LOS: 1 day

## 2014-09-17 NOTE — Progress Notes (Signed)
PT Cancellation/Discharge Note  Patient Details Name: Charles Hendricks MRN: [id number] DOB: [birth year]   Cancelled Treatment:     Noted pt with dementia admitted with confusion (along with multiple other medical problems). Noted recent hospitalization and reviewed PT notes from that admission (pt was also confused) and pt made no progress with PT at that time.  Pt currently not able to follow commands. He is crying out as RN is trying to turn/roll him. Agree with RN that pt is not appropriate for PT evaluation and will sign-off.   Mackinzee Roszak 09/17/2014, 1:28 PM Pager [phone]

## 2014-09-17 NOTE — Progress Notes (Signed)
Pt peripheral CBG sticks showing results in the 30-40's MD notified . Order received to obtain STAT BMP. Lab obtained, CBG slightly above normal . MD notified. No new orders received. Will continue to monitor.

## 2014-09-18 DIAGNOSIS — Z515 Encounter for palliative care: Secondary | ICD-10-CM

## 2014-09-18 DIAGNOSIS — T68XXXS Hypothermia, sequela: Secondary | ICD-10-CM

## 2014-09-18 DIAGNOSIS — I959 Hypotension, unspecified: Secondary | ICD-10-CM

## 2014-09-18 LAB — BASIC METABOLIC PANEL
Anion gap: 16 — ABNORMAL HIGH (ref 5–15)
BUN: 58 mg/dL — ABNORMAL HIGH (ref 6–20)
CHLORIDE: 99 mmol/L — AB (ref 101–111)
CO2: 22 mmol/L (ref 22–32)
CREATININE: 2.37 mg/dL — AB (ref 0.61–1.24)
Calcium: 8 mg/dL — ABNORMAL LOW (ref 8.9–10.3)
GFR calc Af Amer: 27 mL/min — ABNORMAL LOW (ref >60)
GFR calc non Af Amer: 23 mL/min — ABNORMAL LOW (ref >60)
Glucose, Bld: 136 mg/dL — ABNORMAL HIGH (ref 70–99)
POTASSIUM: 4 mmol/L (ref 3.5–5.1)
Sodium: 137 mmol/L (ref 135–145)

## 2014-09-18 LAB — CBC
HEMATOCRIT: 36.9 % — AB (ref 39.0–52.0)
Hemoglobin: 12.1 g/dL — ABNORMAL LOW (ref 13.0–17.0)
MCH: 29.7 pg (ref 26.0–34.0)
MCHC: 32.8 g/dL (ref 30.0–36.0)
MCV: 90.4 fL (ref 78.0–100.0)
Platelets: 140 10*3/uL — ABNORMAL LOW (ref 150–400)
RBC: 4.08 MIL/uL — ABNORMAL LOW (ref 4.22–5.81)
RDW: 16.1 % — ABNORMAL HIGH (ref 11.5–15.5)
WBC: 8.8 10*3/uL (ref 4.0–10.5)

## 2014-09-18 LAB — GLUCOSE, CAPILLARY
GLUCOSE-CAPILLARY: 124 mg/dL — AB (ref 70–99)
Glucose-Capillary: 155 mg/dL — ABNORMAL HIGH (ref 70–99)
Glucose-Capillary: 136 mg/dL — ABNORMAL HIGH (ref 70–99)

## 2014-09-18 MED ORDER — FENTANYL CITRATE (PF) 100 MCG/2ML IJ SOLN
INTRAMUSCULAR | Status: AC
  Administered 2014-09-18: 50 ug via INTRAVENOUS
  Filled 2014-09-18: qty 2

## 2014-09-18 MED ORDER — GLYCOPYRROLATE 0.2 MG/ML IJ SOLN
0.2000 mg | INTRAMUSCULAR | Status: DC | PRN
  Filled 2014-09-18: qty 1

## 2014-09-18 MED ORDER — SILVER SULFADIAZINE 1 % EX CREA
TOPICAL_CREAM | Freq: Every day | CUTANEOUS | Status: DC
  Filled 2014-09-18: qty 85

## 2014-09-18 MED ORDER — LORAZEPAM 2 MG/ML IJ SOLN
1.0000 mg | INTRAMUSCULAR | Status: DC | PRN
  Administered 2014-09-18 (×2): 1 mg via INTRAVENOUS
  Filled 2014-09-18: qty 1

## 2014-09-18 MED ORDER — FENTANYL BOLUS VIA INFUSION
25.0000 ug | INTRAVENOUS | Status: DC | PRN
  Filled 2014-09-18: qty 25

## 2014-09-18 MED ORDER — FENTANYL CITRATE (PF) 100 MCG/2ML IJ SOLN
50.0000 ug | INTRAMUSCULAR | Status: DC | PRN
  Administered 2014-09-18: 50 ug via INTRAVENOUS

## 2014-09-18 MED ORDER — FENTANYL CITRATE (PF) 2500 MCG/50ML IJ SOLN
25.0000 ug/h | Status: DC
  Administered 2014-09-18: 25 ug/h via INTRAVENOUS
  Filled 2014-09-18: qty 100

## 2014-09-18 MED ORDER — LORAZEPAM 2 MG/ML IJ SOLN
INTRAMUSCULAR | Status: AC
  Filled 2014-09-18: qty 1

## 2014-09-18 MED ORDER — MORPHINE SULFATE (CONCENTRATE) 10 MG/0.5ML PO SOLN
10.0000 mg | ORAL | Status: DC | PRN
  Administered 2014-09-18: 10 mg via SUBLINGUAL
  Filled 2014-09-18: qty 0.5

## 2014-09-18 MED ORDER — MORPHINE SULFATE (CONCENTRATE) 10 MG/0.5ML PO SOLN
5.0000 mg | ORAL | Status: DC | PRN
  Administered 2014-09-18: 5 mg via SUBLINGUAL
  Filled 2014-09-18: qty 0.5

## 2014-09-18 NOTE — Progress Notes (Addendum)
Attempted to call report to RN on 6N, awaiting call back.  Report called to RN on 6N. Will transfer pt. No s/s of acute distress noted. Granddaughter Joni Reining aware of transfer.

## 2014-09-18 NOTE — Progress Notes (Signed)
Spoke with Dr. Phillips Odor regarding pt transfer, received telephone order to hold transfer until/if pt becomes more stable.

## 2014-09-18 NOTE — Consult Note (Signed)
Consultation Note Date: 09/18/2014   Patient Name: Charles Hendricks  DOB: 06/29/1924  MRN: [id number]  Age / Sex: 81 y.o., male   PCP: Marden Noble, MD Referring Physician: Elease Etienne, MD  Reason for Consultation: Establishing goals of care, Pain control and Terminal care  Palliative Care Assessment and Plan Summary of Established Goals of Care and Medical Treatment Preferences   81 yo with malnutrition, advanced CHF, severe PVD with multiple extremity wounds, dementia and agitation admitted for 3rd time in last 6 months from SNF. PMT consulted for goals and symptom management.  I found Charles Hendricks writhing in the bed- moaning and calling out for his mother- he was sobbing and agitated-pulling at his sheets- mitted and wound care had just seen him- his brief was full of bloody maroon colored stool. Nose with dried blood- extremely frail and showing signs of severe distress.   I provided immediate pain control with bolus of fentanyl and Ativan- he calmed shortly after that-I spoke with his grand-daughter Charles Hendricks who is the primary decision maker for her grandfather-she does not have formal HCPOA paperwork and she is not local- he has a sister who is local but there may be family conflict over his care and decisions. He had apparently been comfort care on a prior admission but "improved" and went for rehab-that did not last long of course and he was re-admitted.   I spoke with Charles Hendricks on the phone as well as his sister. We have exhausted all medically reasonable interventions for this gentleman over the past 6 months- he has little if any room for true recovery- he is experiencing what appears to be extreme suffering on my evaluation today- I strongly advocated for comfort care as the humane and compassionate way to treat him- given his multiple severe and chronic diseases.  Family have agreed on Comfort Care as the primary Goal and to minimize any additional invasive work up.   Code  Status/Advance Care Planning:  DNR  Symptom Management:   Pain/Dyspnea: Fentanyl Infusion at 20mcg/hr with bolus dosing orders- will titrate if needed  Gave him a Fentanyl bolus and he had significant relief of his distress  PRN ativan for agitation and distress  Palliative Prophylaxis: Oral Care, Bowel regimen if needed, lacri-lube- O2 is optional prn  Psycho-social/Spiritual:   Support System: Poor support- family is out of town except for one sister who is last living sibling  Desire for further Chaplaincy support:yes  Prognosis: < 2 weeks  Discharge Planning:  Anticipate Hospital Death       Chief Complaint/History of Present Illness: Altered Mental Status, Hypotension and hyporthermia, from SNF  Primary Diagnoses  Present on Admission:  . Hypoglycemia . Lactic acidosis . Acute encephalopathy . Acute on chronic systolic and diastolic heart failure, NYHA class 4 . AKI (acute kidney injury) . Chronic atrial fibrillation . CKD (chronic kidney disease), stage III . Hyperkalemia . Hypotension . Hypothermia . Sepsis  Palliative Review of Systems: Unable to obtain I have reviewed the medical record, interviewed the patient and family, and examined the patient. The following aspects are pertinent.  Past Medical History  Diagnosis Date  . Bradycardia   . Chronic atrial fibrillation     Sick Sinus syndrome 1998  . Complete heart block     a. Initial placement 2005. s/p Medtronic pacemaker gen change 2013.  Marland Kitchen HTN (hypertension), benign   . Pacemaker-Medtronic   . Non-ischemic cardiomyopathy     last EF 35% by Echo at  [facility] 2008  . Gout   . Elevated PSA     asymptomatic  . Corns and callosities   . Cognitive impairment     a. Taken off Coumadin due to this.  . Chronic combined systolic and diastolic CHF (congestive heart failure)   . CKD (chronic kidney disease), stage III    History   Social History  . Marital Status: Married    Spouse Name: N/A    . Number of Children: N/A  . Years of Education: N/A   Social History Main Topics  . Smoking status: Former Games developer  . Smokeless tobacco: Not on file  . Alcohol Use: No  . Drug Use: Not on file  . Sexual Activity: Not on file   Other Topics Concern  . None   Social History Narrative   Family History  Problem Relation Age of Onset  . Heart failure Mother    Scheduled Meds: . antiseptic oral rinse  7 mL Mouth Rinse BID  . Chlorhexidine Gluconate Cloth  6 each Topical Q0600  . mupirocin ointment  1 application Nasal BID  . oxymetazoline  1 spray Each Nare BID  . silver sulfADIAZINE   Topical Daily   Continuous Infusions: . fentaNYL infusion INTRAVENOUS     PRN Meds:.[DISCONTINUED] acetaminophen **OR** acetaminophen, fentaNYL, fentaNYL (SUBLIMAZE) injection, glycopyrrolate, LORazepam, [DISCONTINUED] ondansetron **OR** ondansetron (ZOFRAN) IV Medications Prior to Admission:  Prior to Admission medications   Medication Sig Start Date End Date Taking? Authorizing Provider  furosemide (LASIX) 40 MG tablet Take 1.5 tablets (60 mg total) by mouth 2 (two) times daily. 1 tablet early a.m. and one tablet midafternoon. Patient taking differently: Take 60 mg by mouth 2 (two) times daily.  09/08/14  Yes Rosalio Macadamia, NP  Magnesium Oxide 400 MG CAPS Take 1 capsule (400 mg total) by mouth 2 (two) times daily. 08/30/14  Yes Ripudeep Jenna Luo, MD  metoprolol succinate (TOPROL-XL) 50 MG 24 hr tablet Take 1 tablet (50 mg total) by mouth daily. Take with or immediately following a meal. 07/19/14  Yes Rhonda G Barrett, PA-C  potassium chloride SA (K-DUR,KLOR-CON) 20 MEQ tablet Take 1 tablet (20 mEq total) by mouth 2 (two) times daily. 09/08/14  Yes Rosalio Macadamia, NP   Allergies  Allergen Reactions  . Asa [Aspirin] Other (See Comments)    Told not to take med-per MD   CBC:    Component Value Date/Time   WBC 8.8 09/18/2014 0623   HGB 12.1* 09/18/2014 0623   HCT 36.9* 09/18/2014 0623   PLT 140*  09/18/2014 0623   MCV 90.4 09/18/2014 0623   NEUTROABS 6.7 Oct 03, 2014 2137   LYMPHSABS 0.3* 03-Oct-2014 2137   MONOABS 0.7 10-03-14 2137   EOSABS 0.0 10/03/14 2137   BASOSABS 0.0 Oct 03, 2014 2137   Comprehensive Metabolic Panel:    Component Value Date/Time   NA 137 09/18/2014 0623   K 4.0 09/18/2014 0623   CL 99* 09/18/2014 0623   CO2 22 09/18/2014 0623   BUN 58* 09/18/2014 0623   CREATININE 2.37* 09/18/2014 0623   CREATININE 1.78* 09/08/2014 1611   GLUCOSE 136* 09/18/2014 0623   CALCIUM 8.0* 09/18/2014 0623   AST 43* 09/17/2014 0250   ALT 38 09/17/2014 0250   ALKPHOS 95 09/17/2014 0250   BILITOT 1.6* 09/17/2014 0250   PROT 5.3* 09/17/2014 0250   ALBUMIN 2.2* 09/17/2014 0250    Physical Exam: Vital Signs: BP 135/60 mmHg  Pulse 132  Temp(Src) 97.5 F (36.4 C) (Axillary)  Resp  32  Ht 6' (1.829 m)  Wt 61.3 kg (135 lb 2.3 oz)  BMI 18.32 kg/m2  SpO2 100% SpO2: SpO2: 100 % O2 Device: O2 Device: Not Delivered O2 Flow Rate: O2 Flow Rate (L/min): 2 L/min Intake/output summary:  Intake/Output Summary (Last 24 hours) at 09/18/14 1047 Last data filed at 09/17/14 1942  Gross per 24 hour  Intake     57 ml  Output    650 ml  Net   -593 ml   LBM:   Baseline Weight: Weight: 62.596 kg (138 lb) Most recent weight: Weight: 61.3 kg (135 lb 2.3 oz)  Exam Findings:           Palliative Performance Scale: 10%               Additional Data Reviewed: Recent Labs     09/17/14  2120  09/18/14  0623  WBC  8.9  8.8  HGB  12.2*  12.1*  PLT  137*  140*  NA  136  137  BUN  54*  58*  CREATININE  2.29*  2.37*     Time In: 10AM Time Out: 11AM Time Total: 60 minutes  Greater than 50%  of this time was spent counseling and coordinating care related to the above assessment and plan.  Signed by: Hilbert Odor, DO  09/18/2014, 10:47 AM  Please contact Palliative Medicine Team phone at [phone] for questions and concerns.
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
## 2014-09-18 NOTE — Progress Notes (Signed)
PROGRESS NOTE    Charles Hendricks ONG:[id number] DOB: Jul 05, 1924 DOA: Oct 11, 2014 PCP: Pearla Dubonnet, MD  Primary Cardiologist: Dr. Verdis Prime  HPI/Brief narrative 81 year old male patient with history of chronic atrial fibrillation-not a candidate for anticoagulation, NICM with EF 35%, chronic combined CHF, cognitive impairment/psychotic disorder, generalized weakness, HTN, CHB s/p PPM - followed by Dr. Ladona Ridgel, CKD, NSVT on PPM interrogation, non-compliance, & frailty, hospitalized 08/20/14-08/30/14 for similar presentation with hypothermia, hypoglycemia and hypotension and was assessed to have acute encephalopathy secondary to sepsis of unclear origin complicating underlying dementia. Initial plans were DO NOT RESUSCITATE and comfort care per family. Patient however improved and was discharged to [facility] health care/SNF. Now transferred back to [facility] on 11-Oct-2014 with decreased LOC, hypoglycemia and hypothermia. In the ED noted to be in acute on chronic renal failure, hypoglycemic and admitted for possible sepsis again of unclear origin.  Assessment/Plan:  Hypoglycemia - Etiology unclear. - No reported diabetes and not on hypoglycemics - May be secondary to worsened renal functions and poor oral intake. Less likely from adrenal insufficiency (random cortisol 25). - Treat per hypoglycemia protocol - Started D10W IV infusion. - on 5/8, noted CBGs in the 36-45 MG per DL range despite BMP showing glucose of 236 earlier. A repeated BMP shows glucose 150. Discrepancy exists between CBG checks and BMP? Etiology. - Patient transitioned to full comfort care on 5/9.  Possible sepsis - Unclear source - Urine microscopy not impressive for UTI - Chest x-ray: No pneumonia reported - Blood cultures 2: Negative to date. - Urine culture: Negative - Discontinued all antibiotics and transitioned to comfort care on 5/9.  Hypothermia - ? Possible sepsis: Treat as such - TSH mildly elevated at 5.152 but  likely not contributing - Resolved   Acute on stage III chronic kidney disease - Precipitated by dehydration and sepsis - Gentle IV fluids and follow BMP - Creatinine has not changed in the last 48 hours. May be close to baseline. - Baseline creatinine may be in the 1. 7-2 range.  Hyperkalemia - Secondary to acute on chronic kidney disease and potassium supplements - Resolved after IV fluids and brief bicarbonate infusion  Hypotension - Secondary to dehydration and sepsis - Improved. Now getting volume overloaded and weeping from his arms. Reduce IV fluids   Acute encephalopathy - Secondary to acute illness complicating underlying moderate dementia - No focal deficits - Treat acute illness and monitor - CT head without acute findings - Mental status not improving and may be declining.  Chronic atrial fibrillation -Controlled ventricular rate or on demand V paced - Not candidate for anticoagulation due to fall risk - IV metoprolol when necessary as blood pressure tolerates  Lactic acidosis - Secondary to dehydration, hypotension and sepsis - IV fluids - Improving  Acute on chronic combined systolic and diastolic CHF/NICM/CHB s/p PPM - Clinically dehydrated -Hydrated gently with IV fluids and now seems to be getting volume overloaded. Cut back on IV fluids.  - Received a dose of IV Lasix on 5/8. Incontinent  Right calf, sacraland right hip  wounds - Not infected - WOC consulted   Right epistaxis - Unclear etiology. Seems to have abated after packing of right nostril.  DO NOT RESUSCITATE  Failure to thrive/comfort care - Discussed extensively on a daily basis with patient's granddaughter Ms. Arther Abbott and have been updating her regarding patient's lack of improvement, ongoing decline and overall poor prognosis despite aggressive treatment. Offered palliative care consultation for goals of care which she was agreeable  to. Dr. Phillips Odor has seen patient, discussed with  family and transitioned to full comfort care.  Code Status: DO NOT RESUSCITATE Family Communication: Discussed with patient's granddaughter Miss Joni Reining Jodene Nam) Smith-healthcare decision-maker on 09/18/14. Disposition Plan: To be determined. May even demise in the hospital. Transfer to medical floor.   Consultants:  WOC  Procedures:  None  Antibiotics:  IV vancomycin 5/6 > 5/9  IV Zosyn 5/6 > 5/9  Subjective: Nonverbal. Intermittently moaning and groaning with no specific location of pain identified. Overnight had bleeding from right nostril which resolved after packing.  Objective: Filed Vitals:   09/18/14 0300 09/18/14 0324 09/18/14 0748 09/18/14 0821  BP:  100/79 135/60   Pulse:  132    Temp:  98.1 F (36.7 C)  97.5 F (36.4 C)  TempSrc:  Oral  Axillary  Resp: 22 32    Height:      Weight:      SpO2:  100%      Intake/Output Summary (Last 24 hours) at 09/18/14 1150 Last data filed at 09/17/14 1942  Gross per 24 hour  Intake     57 ml  Output    550 ml  Net   -493 ml   Filed Weights   10/06/2014 2102 09/16/14 0200 09/16/14 0355  Weight: 62.596 kg (138 lb) 62.6 kg (138 lb 0.1 oz) 61.3 kg (135 lb 2.3 oz)     Exam:  General exam: Moderately built and frail elderly male, chronically ill-looking lying in fetal position, intermittently groaning and moaning in pain and fidgety. Respiratory system: Reduced breath sounds bilaterally. No increased work of breathing. Cardiovascular system: S1 & S2 heard, RRR. No JVD, murmurs, gallops, clicks. 1+ bilateral leg edema/thigh edema/anterior abdominal wall edema. Telemetry: A. fib with ventricular rate in the 90-120's. Gastrointestinal system: Abdomen is nondistended, soft and nontender. Normal bowel sounds heard. Central nervous system: somnolent but easily arousable and not oriented. No focal neurological deficits.Does not follow instructions.  Extremities: moves all extremities spontaneously.  Skin: Large superficial ulcer  right mid calf and 2 small stage II clean ulcers over right gluteal region. Neither appear infected.Approximately 2 cm diameter right hip stage II ulcer with tan base and no other acute findings   Data Reviewed: Basic Metabolic Panel:  Recent Labs Lab 09/17/14 0250 09/17/14 1002 09/17/14 1742 09/17/14 2120 09/18/14 0623  NA 138 139 136 136 137  K 3.5 3.9 3.9 4.0 4.0  CL 100* 102 98* 100* 99*  CO2 26 21* 24 23 22   GLUCOSE 236* 150* 98 138* 136*  BUN 61* 59* 56* 54* 58*  CREATININE 2.40* 2.35* 2.35* 2.29* 2.37*  CALCIUM 8.3* 8.2* 8.1* 8.1* 8.0*   Liver Function Tests:  Recent Labs Lab 09/17/14 0250  AST 43*  ALT 38  ALKPHOS 95  BILITOT 1.6*  PROT 5.3*  ALBUMIN 2.2*   No results for input(s): LIPASE, AMYLASE in the last 168 hours. No results for input(s): AMMONIA in the last 168 hours. CBC:  Recent Labs Lab 09/26/2014 2137 09/11/2014 2149 09/16/14 1132 09/17/14 0250 09/17/14 2120 09/18/14 0623  WBC 7.6  --  7.7 8.0 8.9 8.8  NEUTROABS 6.7  --   --   --   --   --   HGB 13.0 14.6 11.6* 11.4* 12.2* 12.1*  HCT 38.9* 43.0 35.5* 35.1* 36.1* 36.9*  MCV 91.1  --  91.0 91.6 89.4 90.4  PLT 156  --  150 128* 137* 140*   Cardiac Enzymes: No results for input(s): CKTOTAL, CKMB,  CKMBINDEX, TROPONINI in the last 168 hours. BNP (last 3 results)  Recent Labs  10/22/13 1600  PROBNP 3408.0*   CBG:  Recent Labs Lab 09/17/14 2134 09/17/14 2313 09/18/14 0207 09/18/14 0332 09/18/14 0816  GLUCAP 71 110* 155* 136* 124*    Recent Results (from the past 240 hour(s))  Urine culture     Status: None   Collection Time: 09/10/2014 11:09 PM  Result Value Ref Range Status   Specimen Description URINE, CATHETERIZED  Final   Special Requests NONE  Final   Colony Count NO GROWTH Performed at [facility]   Final   Culture NO GROWTH Performed at [facility]   Final   Report Status 09/17/2014 FINAL  Final  Culture, blood (x 2)     Status: None (Preliminary  result)   Collection Time: 09/16/14  2:54 AM  Result Value Ref Range Status   Specimen Description BLOOD RIGHT HAND  Final   Special Requests BOTTLES DRAWN AEROBIC ONLY 1CC  Final   Culture   Final           BLOOD CULTURE RECEIVED NO GROWTH TO DATE CULTURE WILL BE HELD FOR 5 DAYS BEFORE ISSUING A FINAL NEGATIVE REPORT Performed at [facility]    Report Status PENDING  Incomplete  MRSA PCR Screening     Status: Abnormal   Collection Time: 09/16/14  3:40 AM  Result Value Ref Range Status   MRSA by PCR POSITIVE (A) NEGATIVE Final    Comment:        The GeneXpert MRSA Assay (FDA approved for NASAL specimens only), is one component of a comprehensive MRSA colonization surveillance program. It is not intended to diagnose MRSA infection nor to guide or monitor treatment for MRSA infections. RESULT CALLED TO, READ BACK BY AND VERIFIED WITH: WORLEY,L RN [phone] AT 0701 SKEEN,P   Culture, blood (x 2)     Status: None (Preliminary result)   Collection Time: 09/16/14  8:15 AM  Result Value Ref Range Status   Specimen Description BLOOD RIGHT ANTECUBITAL  Final   Special Requests BOTTLES DRAWN AEROBIC ONLY 3CC  Final   Culture   Final           BLOOD CULTURE RECEIVED NO GROWTH TO DATE CULTURE WILL BE HELD FOR 5 DAYS BEFORE ISSUING A FINAL NEGATIVE REPORT Performed at [facility]    Report Status PENDING  Incomplete         Studies: No results found.      Scheduled Meds: . antiseptic oral rinse  7 mL Mouth Rinse BID  . Chlorhexidine Gluconate Cloth  6 each Topical Q0600  . mupirocin ointment  1 application Nasal BID  . oxymetazoline  1 spray Each Nare BID  . silver sulfADIAZINE   Topical Daily   Continuous Infusions: . fentaNYL infusion INTRAVENOUS 25 mcg/hr (09/18/14 1109)    Active Problems:   Chronic atrial fibrillation   CKD (chronic kidney disease), stage III   Acute on chronic systolic and diastolic heart failure, NYHA class 4   AKI (acute  kidney injury)   Acute encephalopathy   Hypoglycemia   Hypothermia   Lactic acidosis   Hyperkalemia   Hypotension   Sepsis    Time spent: 20 minutes.    Marcellus Scott, MD, FACP, FHM. [facility] Pager [phone]  If 7PM-7AM, please contact night-coverage [email or web address] Password [email or web address] 09/18/2014, 11:50 AM    LOS: 2 days

## 2014-09-18 NOTE — Progress Notes (Signed)
Nutrition Brief Note  Chart reviewed. Pt now transitioning to comfort care. Hospital death expected. No nutrition interventions warranted at this time.  Please consult as needed.   Joaquin Courts, RD, LDN, CNSC Pager [phone] After Hours Pager [phone]

## 2014-09-18 NOTE — Progress Notes (Signed)
Chaplain responded to consult, pt actively dying.  Pt currently comfortable and asleep.  Chaplain conferred with RN, no family present.  RN agreed to contact chaplain if family arrived and required services.  Chaplain will follow up as needed.  Erroll Luna, Chaplain 09/18/2014 11:10 AM

## 2014-09-18 NOTE — Progress Notes (Signed)
Pt noted to have bloody stool, Dr. Phillips Odor at bedside and aware. No s/s of acute distress noted. Will continue to monitor

## 2014-09-20 ENCOUNTER — Encounter: Payer: Self-pay | Admitting: Internal Medicine

## 2014-09-22 LAB — CULTURE, BLOOD (ROUTINE X 2)
CULTURE: NO GROWTH
Culture: NO GROWTH

## 2014-10-03 ENCOUNTER — Ambulatory Visit: Payer: Medicare Other | Admitting: Nurse Practitioner

## 2014-10-11 NOTE — Progress Notes (Signed)
After receiving report, walked into pt's room and pt did not appear to breathing.  No pulse felt, no heartbeat heard.  Confirmed death with Asher Muir, RN.  Asher Muir called pt's sister and niece to inform of death.  I paged pt's attending MD and asked about signing death certificate.  [facility] called, pt not a potential donor and Meriam Sprague gave referral number, which was charted.  Pt's family is deciding on funeral home and then calling me back. Sherald Barge

## 2014-10-11 NOTE — Discharge Summary (Addendum)
Death Summary  Charles Hendricks CWU:[id number] DOB: 1924-06-21 DOA: 09-29-2014  PCP: Pearla Dubonnet, MD PCP/Office notified: forwarded DC summary.  Admit date: 2014-09-29 Date of Death: Oct 03, 2014  Final Diagnoses:  Active Problems:   Chronic atrial fibrillation   CKD (chronic kidney disease), stage III   Acute on chronic systolic and diastolic heart failure, NYHA class 4   AKI (acute kidney injury)   Acute encephalopathy   Hypoglycemia   Hypothermia   Lactic acidosis   Hyperkalemia   Hypotension   SIRS     History of present illness and Hospital course:  81 year old male patient with history of chronic atrial fibrillation-not a candidate for anticoagulation, NICM with EF 35%, chronic combined CHF, cognitive impairment/psychotic disorder, generalized weakness, HTN, CHB s/p PPM - followed by Dr. Ladona Ridgel, CKD, NSVT on PPM interrogation, non-compliance, & frailty, hospitalized 08/20/14-08/30/14 for similar presentation with hypothermia, hypoglycemia and hypotension and was assessed to have acute encephalopathy secondary to SIRS of unclear origin complicating underlying dementia. Initial plans were DO NOT RESUSCITATE and comfort care per family. Patient however improved and was discharged to [facility] health care/SNF. Now transferred back to [facility] on 29-Sep-2014 with decreased LOC, hypoglycemia and hypothermia. In the ED noted to be in acute on chronic renal failure, hypoglycemic and admitted for possible sepsis again of unclear origin. Admitted to stepdown unit and treated aggressively without improvement and actually patient declined. Palliative care was consulted-coordinated with family and transitioned to full comfort care on 09/18/14. Patient demised in the hospital on 10/03/14. During the hospitalization, he was treated for hypoglycemia, possible sepsis of unclear source, hypothermia, acute on stage III chronic kidney disease, hyperkalemia, hypotension, acute encephalopathy, chronic A. fib, lactic  acidosis, acute on chronic combined systolic and diastolic CHF, multiple wounds that he had on admission. His care was discussed on a daily basis with his granddaughter.    Time: 20 minutes  Signed:  Kirtis Challis  [facility] 2014-10-03, 4:12 PM   Addendum All cultures negative. No clear or suspected source of sepsis identified. As per coding team, cannot be coded as sepsis and hence changed sepsis to SIRS-of unclear etiology.  Marcellus Scott, MD, FACP, FHM. [facility] Pager [phone]  If 7PM-7AM, please contact night-coverage [email or web address] Password [facility] 11/02/2014, 6:55 PM

## 2014-10-11 NOTE — Progress Notes (Signed)
MD returned page.  Working on Therapist, sports. Sherald Barge

## 2014-10-11 NOTE — Progress Notes (Signed)
PROGRESS NOTE    Charles Hendricks ZOX:[id number] DOB: October 27, 1924 DOA: Sep 25, 2014 PCP: Pearla Dubonnet, MD  Primary Cardiologist: Dr. Verdis Prime  HPI/Brief narrative 81 year old male patient with history of chronic atrial fibrillation-not a candidate for anticoagulation, NICM with EF 35%, chronic combined CHF, cognitive impairment/psychotic disorder, generalized weakness, HTN, CHB s/p PPM - followed by Dr. Ladona Ridgel, CKD, NSVT on PPM interrogation, non-compliance, & frailty, hospitalized 08/20/14-08/30/14 for similar presentation with hypothermia, hypoglycemia and hypotension and was assessed to have acute encephalopathy secondary to sepsis of unclear origin complicating underlying dementia. Initial plans were DO NOT RESUSCITATE and comfort care per family. Patient however improved and was discharged to [facility] health care/SNF. Now transferred back to [facility] on 09/25/2014 with decreased LOC, hypoglycemia and hypothermia. In the ED noted to be in acute on chronic renal failure, hypoglycemic and admitted for possible sepsis again of unclear origin. Admitted to stepdown unit and treated aggressively without improvement and actually patient declined. Palliative care was consulted-coordinated with family and transitioned to full comfort care on 09/18/14.  Assessment/Plan:  Hypoglycemia - Etiology unclear. - No reported diabetes and not on hypoglycemics - May be secondary to worsened renal functions and poor oral intake. Less likely from adrenal insufficiency (random cortisol 25). - Treat per hypoglycemia protocol - Started D10W IV infusion. - on 5/8, noted CBGs in the 36-45 MG per DL range despite BMP showing glucose of 236 earlier. A repeated BMP shows glucose 150. Discrepancy exists between CBG checks and BMP? Etiology. - Patient transitioned to full comfort care on 5/9.  Possible sepsis - Unclear source - Urine microscopy not impressive for UTI - Chest x-ray: No pneumonia reported - Blood cultures 2:  Negative to date. - Urine culture: Negative - Discontinued all antibiotics and transitioned to comfort care on 5/9.  Hypothermia - ? Possible sepsis: Treat as such - TSH mildly elevated at 5.152 but likely not contributing - Resolved   Acute on stage III chronic kidney disease - Precipitated by dehydration and sepsis - Gentle IV fluids and follow BMP - Creatinine has not changed in the last 48 hours. May be close to baseline. - Baseline creatinine may be in the 1. 7-2 range.  Hyperkalemia - Secondary to acute on chronic kidney disease and potassium supplements - Resolved after IV fluids and brief bicarbonate infusion  Hypotension - Secondary to dehydration and sepsis - Improved. Now getting volume overloaded and weeping from his arms. Reduce IV fluids   Acute encephalopathy - Secondary to acute illness complicating underlying moderate dementia - No focal deficits - Treat acute illness and monitor - CT head without acute findings - Mental status not improving and may be declining.  Chronic atrial fibrillation -Controlled ventricular rate or on demand V paced - Not candidate for anticoagulation due to fall risk - IV metoprolol when necessary as blood pressure tolerates  Lactic acidosis - Secondary to dehydration, hypotension and sepsis - IV fluids - Improving  Acute on chronic combined systolic and diastolic CHF/NICM/CHB s/p PPM - Clinically dehydrated -Hydrated gently with IV fluids and now seems to be getting volume overloaded. Cut back on IV fluids.  - Received a dose of IV Lasix on 5/8. Incontinent  Right calf, sacraland right hip  wounds - Not infected - WOC consulted   Right epistaxis - Unclear etiology. Seems to have abated after packing of right nostril.  DO NOT RESUSCITATE  Failure to thrive/comfort care - Discussed extensively on a daily basis until 5/9 with patient's granddaughter Ms. Arther Abbott and have  been updating her regarding patient's lack of  improvement, ongoing decline and overall poor prognosis despite aggressive treatment. Offered palliative care consultation for goals of care which she was agreeable to. Dr. Phillips Odor saw patient, discussed with family and transitioned to full comfort care on 5/9.  Code Status: DO NOT RESUSCITATE Family Communication: Discussed with patient's granddaughter Miss Joni Reining Jodene Nam) Smith-healthcare decision-maker on 09/18/14. Disposition Plan: May demised in the hospital.   Consultants:  WOC  Palliative care medicine  Procedures:  None  Antibiotics:  IV vancomycin 5/6 > 5/9  IV Zosyn 5/6 > 5/9  Subjective: Unresponsive. As per nursing, comfortable and in no pain or discomfort issues.  Objective: Filed Vitals:   09/18/14 0748 09/18/14 0821 09/18/14 1609 09/14/2014 0610  BP: 135/60  113/91 102/75  Pulse:   96 72  Temp:  97.5 F (36.4 C) 97.5 F (36.4 C) 97.7 F (36.5 C)  TempSrc:  Axillary Axillary Axillary  Resp:   22 13  Height:      Weight:      SpO2:   98% 97%    Intake/Output Summary (Last 24 hours) at 09/11/2014 1200 Last data filed at 09/13/2014 0525  Gross per 24 hour  Intake    167 ml  Output      0 ml  Net    167 ml   Filed Weights   09-25-2014 2102 09/16/14 0200 09/16/14 0355  Weight: 62.596 kg (138 lb) 62.6 kg (138 lb 0.1 oz) 61.3 kg (135 lb 2.3 oz)     Exam:  General exam: Moderately built and frail elderly male, chronically ill-looking lying comfortably in bed. Respiratory system: Reduced breath sounds bilaterally. No increased work of breathing. Cardiovascular system: S1 & S2 heard, RRR. No JVD, murmurs, gallops, clicks. 1+ bilateral leg edema/thigh edema/anterior abdominal wall edema.  Gastrointestinal system: Abdomen is nondistended, soft and nontender. Normal bowel sounds heard. Central nervous system: Unresponsive    Data Reviewed: Basic Metabolic Panel:  Recent Labs Lab 09/17/14 0250 09/17/14 1002 09/17/14 1742 09/17/14 2120 09/18/14 0623  NA  138 139 136 136 137  K 3.5 3.9 3.9 4.0 4.0  CL 100* 102 98* 100* 99*  CO2 26 21* GLUCOSE 236* 150* 98 138* 136*  BUN 61* 59* 56* 54* 58*  CREATININE 2.40* 2.35* 2.35* 2.29* 2.37*  CALCIUM 8.3* 8.2* 8.1* 8.1* 8.0*   Liver Function Tests:  Recent Labs Lab 09/17/14 0250  AST 43*  ALT 38  ALKPHOS 95  BILITOT 1.6*  PROT 5.3*  ALBUMIN 2.2*   No results for input(s): LIPASE, AMYLASE in the last 168 hours. No results for input(s): AMMONIA in the last 168 hours. CBC:  Recent Labs Lab 25-Sep-2014 2137 2014/09/25 2149 09/16/14 1132 09/17/14 0250 09/17/14 2120 09/18/14 0623  WBC 7.6  --  7.7 8.0 8.9 8.8  NEUTROABS 6.7  --   --   --   --   --   HGB 13.0 14.6 11.6* 11.4* 12.2* 12.1*  HCT 38.9* 43.0 35.5* 35.1* 36.1* 36.9*  MCV 91.1  --  91.0 91.6 89.4 90.4  PLT 156  --  150 128* 137* 140*   Cardiac Enzymes: No results for input(s): CKTOTAL, CKMB, CKMBINDEX, TROPONINI in the last 168 hours. BNP (last 3 results)  Recent Labs  10/22/13 1600  PROBNP 3408.0*   CBG:  Recent Labs Lab 09/17/14 2134 09/17/14 2313 09/18/14 0207 09/18/14 0332 09/18/14 0816  GLUCAP 71 110* 155* 136* 124*    Recent Results (from the  past 240 hour(s))  Urine culture     Status: None   Collection Time: 09/10/2014 11:09 PM  Result Value Ref Range Status   Specimen Description URINE, CATHETERIZED  Final   Special Requests NONE  Final   Colony Count NO GROWTH Performed at [facility]   Final   Culture NO GROWTH Performed at [facility]   Final   Report Status 09/17/2014 FINAL  Final  Culture, blood (x 2)     Status: None (Preliminary result)   Collection Time: 09/16/14  2:54 AM  Result Value Ref Range Status   Specimen Description BLOOD RIGHT HAND  Final   Special Requests BOTTLES DRAWN AEROBIC ONLY 1CC  Final   Culture   Final           BLOOD CULTURE RECEIVED NO GROWTH TO DATE CULTURE WILL BE HELD FOR 5 DAYS BEFORE ISSUING A FINAL NEGATIVE REPORT Performed at  [facility]    Report Status PENDING  Incomplete  MRSA PCR Screening     Status: Abnormal   Collection Time: 09/16/14  3:40 AM  Result Value Ref Range Status   MRSA by PCR POSITIVE (A) NEGATIVE Final    Comment:        The GeneXpert MRSA Assay (FDA approved for NASAL specimens only), is one component of a comprehensive MRSA colonization surveillance program. It is not intended to diagnose MRSA infection nor to guide or monitor treatment for MRSA infections. RESULT CALLED TO, READ BACK BY AND VERIFIED WITH: WORLEY,L RN [phone] AT 0701 SKEEN,P   Culture, blood (x 2)     Status: None (Preliminary result)   Collection Time: 09/16/14  8:15 AM  Result Value Ref Range Status   Specimen Description BLOOD RIGHT ANTECUBITAL  Final   Special Requests BOTTLES DRAWN AEROBIC ONLY 3CC  Final   Culture   Final           BLOOD CULTURE RECEIVED NO GROWTH TO DATE CULTURE WILL BE HELD FOR 5 DAYS BEFORE ISSUING A FINAL NEGATIVE REPORT Performed at [facility]    Report Status PENDING  Incomplete         Studies: No results found.      Scheduled Meds: . oxymetazoline  1 spray Each Nare BID  . silver sulfADIAZINE   Topical Daily   Continuous Infusions: . fentaNYL infusion INTRAVENOUS 50 mcg/hr (09/18/14 1606)    Active Problems:   Chronic atrial fibrillation   CKD (chronic kidney disease), stage III   Acute on chronic systolic and diastolic heart failure, NYHA class 4   AKI (acute kidney injury)   Acute encephalopathy   Hypoglycemia   Hypothermia   Lactic acidosis   Hyperkalemia   Hypotension   Sepsis    Time spent: 20 minutes.    Marcellus Scott, MD, FACP, FHM. [facility] Pager [phone]  If 7PM-7AM, please contact night-coverage [email or web address] Password [email or web address] 10/11/14, 12:00 PM    LOS: 3 days

## 2014-10-11 DEATH — deceased

## 2015-05-04 IMAGING — US US ABDOMEN LIMITED
1 series · 14 of 22 positions shown · non-contrast
Comparison: None.

CLINICAL DATA: 89-year-old male with abnormal LFTs. History of
hypertension. Initial encounter.

EXAM:
US ABDOMEN LIMITED - RIGHT UPPER QUADRANT

[Series 1: us abdomen limited · 0.17mm/px · 14 of 22 slices shown]
[im 1/22]
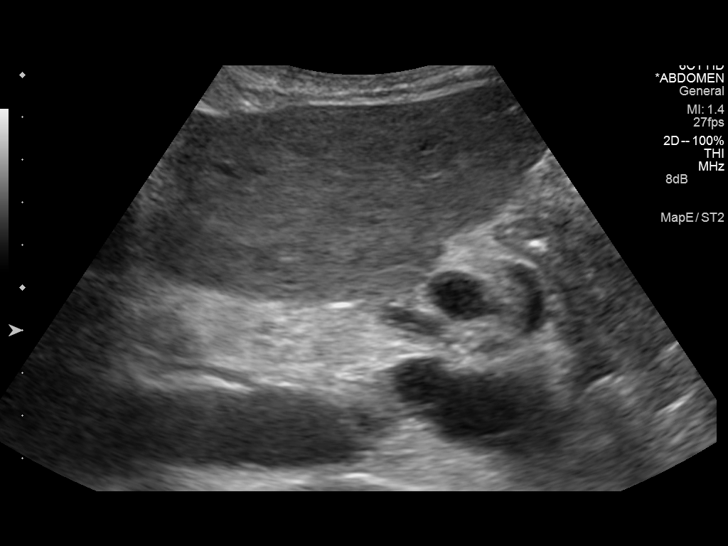
[im 3/22]
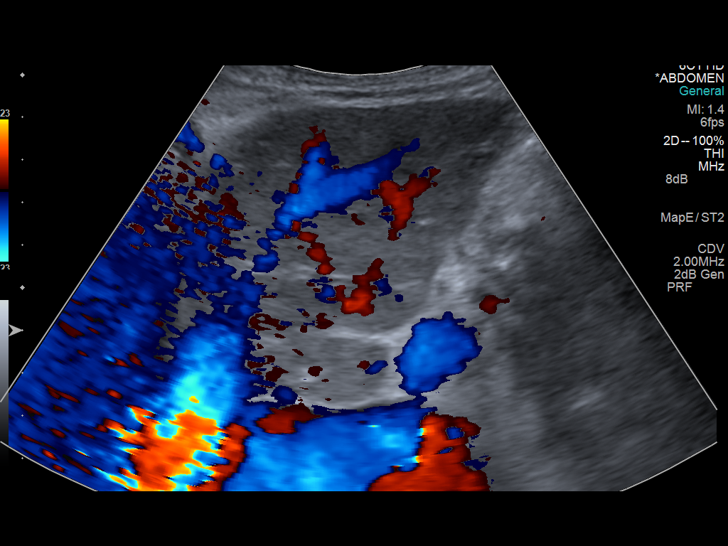
[im 4/22]
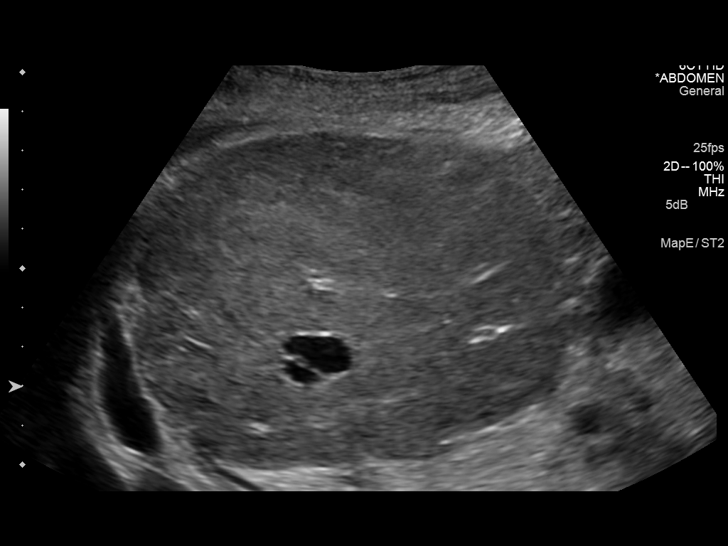
[im 6/22]
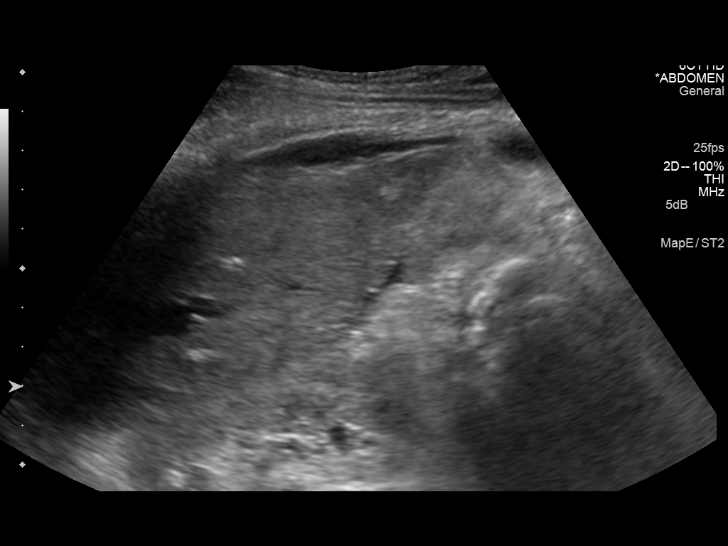
[im 8/22]
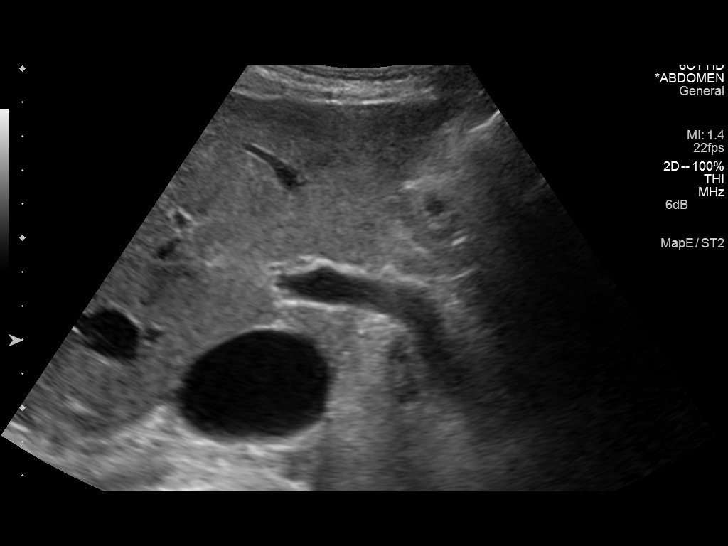
[im 9/22]
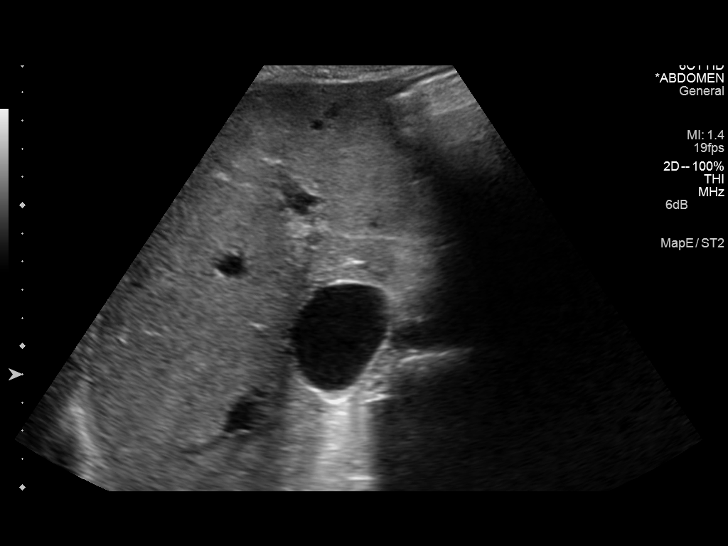
[im 11/22]
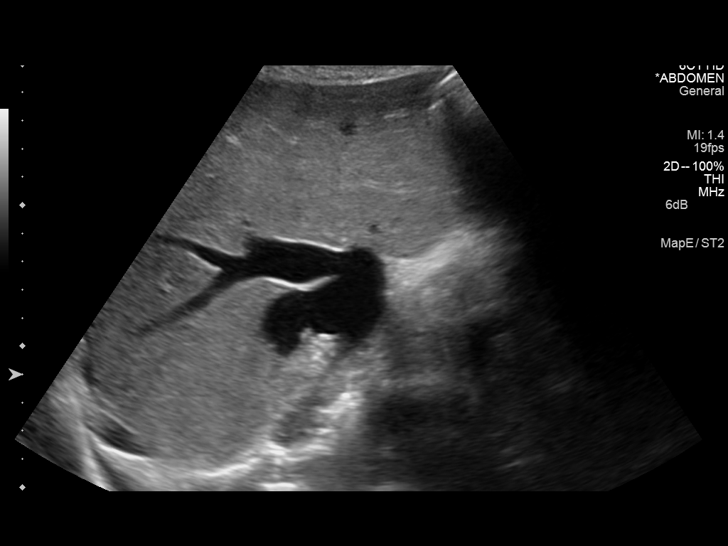
[im 12/22]
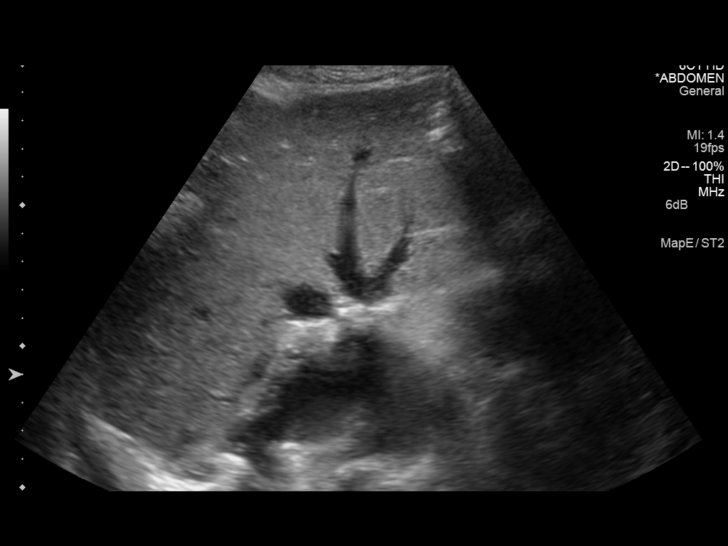
[im 14/22]
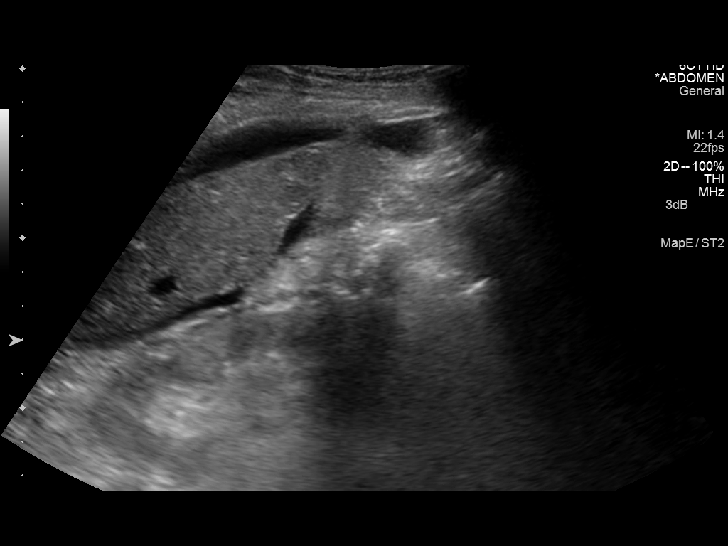
[im 15/22]
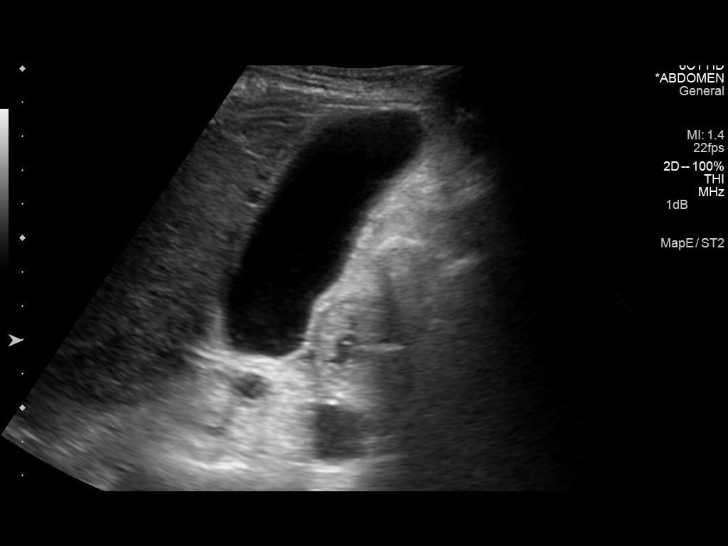
[im 17/22]
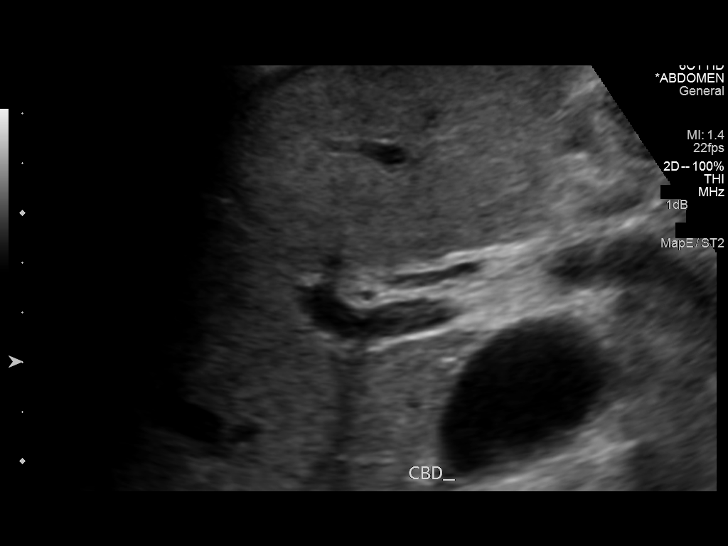
[im 19/22]
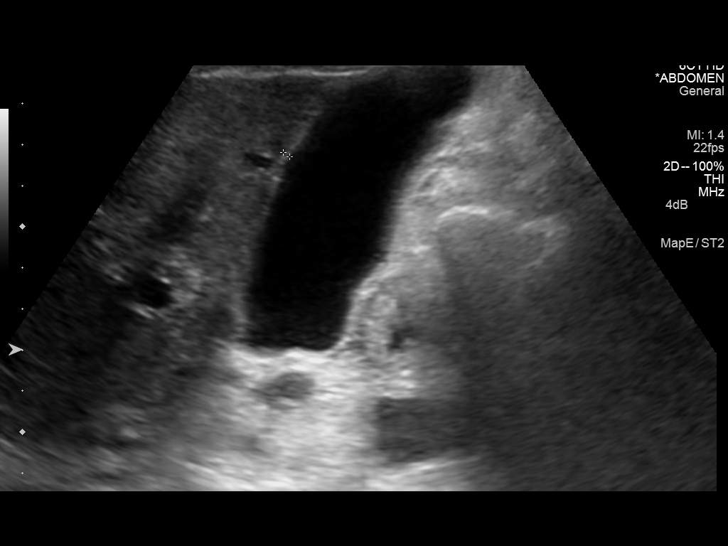
[im 20/22]
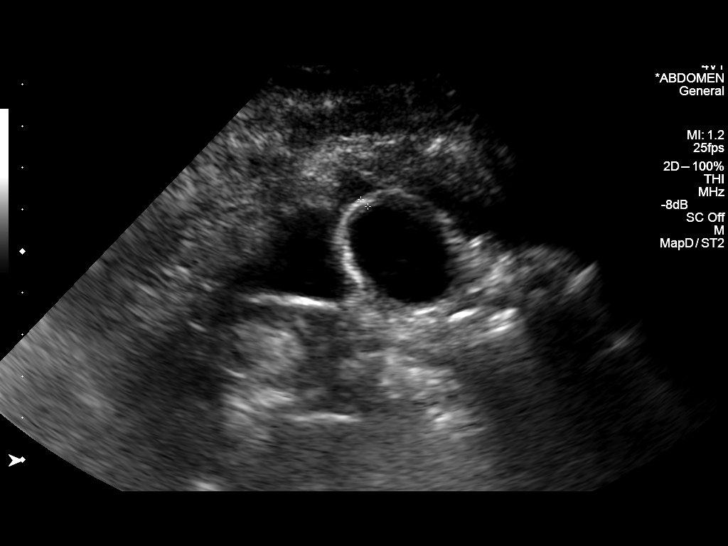
[im 22/22]
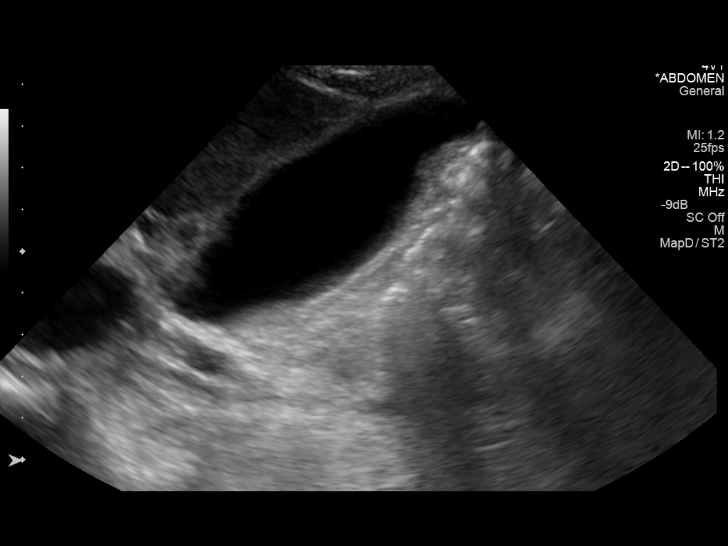

[14 of 22 positions shown; findings below may reference images not displayed]

FINDINGS: Gallbladder:

No gallstones or gallbladder wall thickening. The patient was not
able to respond to stimulation therefore not able to determine if
the patient were tender over this region.

Common bile duct:

Diameter: 3.2 cm.

Liver:

Fatty infiltration liver suspected. Additionally, enlargement of the
hepatic veins and inferior vena cava out consistent with increased
right heart pressure. Within the right lobe liver there is a
septated 1.4 cm cyst.

Small amount of ascites.
IMPRESSION: No gallstones. No ultrasound evidence of cholecystitis. Patient was
not able to respond to stimulation to determine if there was
tenderness over this region.

Increased right heart pressures suspected with increased size of the
inferior vena cava and hepatic veins.

Small amount of ascites.

Fatty infiltration of the liver.

1.4 cm septated cyst right lobe of the liver.

## 2015-05-06 IMAGING — CT CT HEAD W/O CM
1 series · 16 of 30 positions shown, 20 images · non-contrast
Comparison: Head CT 08/20/2014

CLINICAL DATA: Follow-up encephalopathy. Headache. Question stroke.

EXAM:
CT HEAD WITHOUT CONTRAST
TECHNIQUE: Contiguous axial images were obtained from the base of the skull
through the vertex without intravenous contrast.

[Series 2: head 5.0 h30s · axial · 0.46mm/px · z∈[-142,-7]mm · 16 of 31 slices shown, 20 images]
[im 2/31  brain]
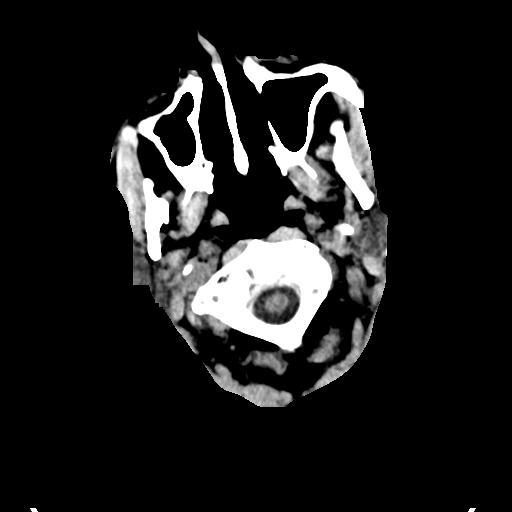
[im 2/31  bone]
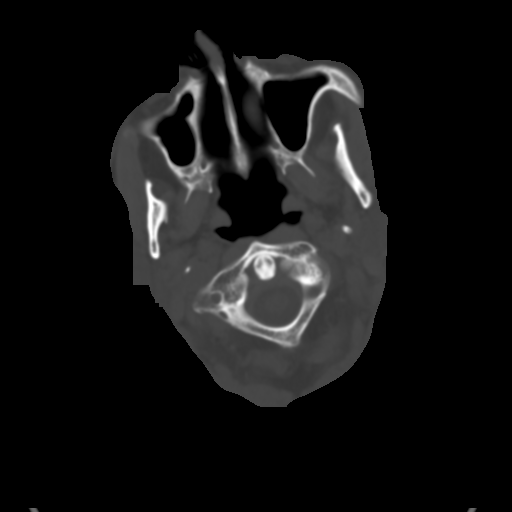
[im 4/31  brain]
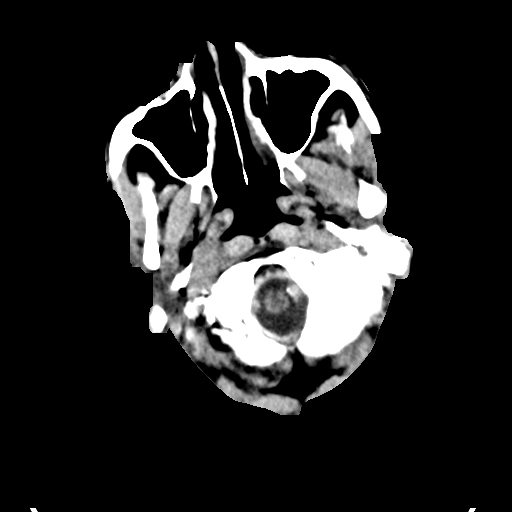
[im 6/31  brain]
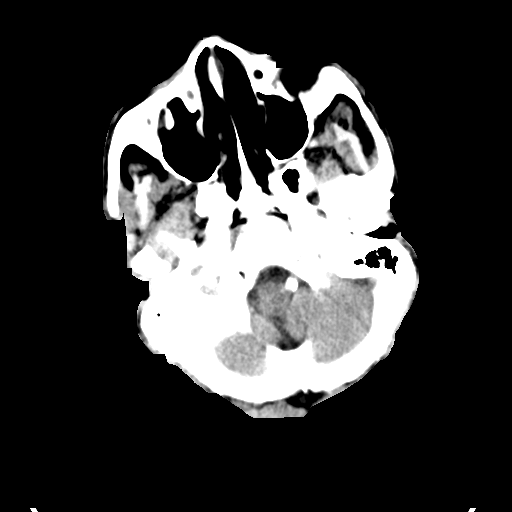
[im 8/31  brain]
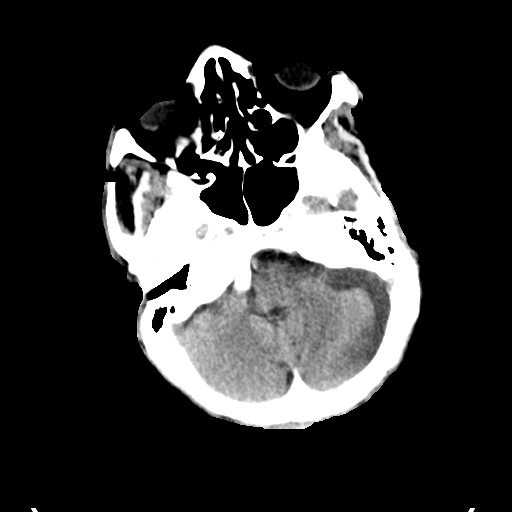
[im 9/31  brain]
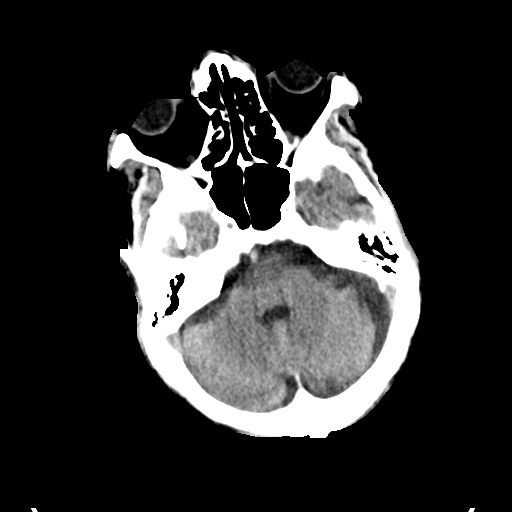
[im 9/31  bone]
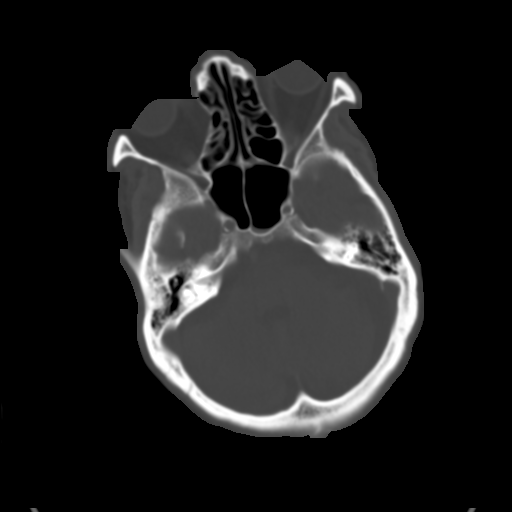
[im 11/31  brain]
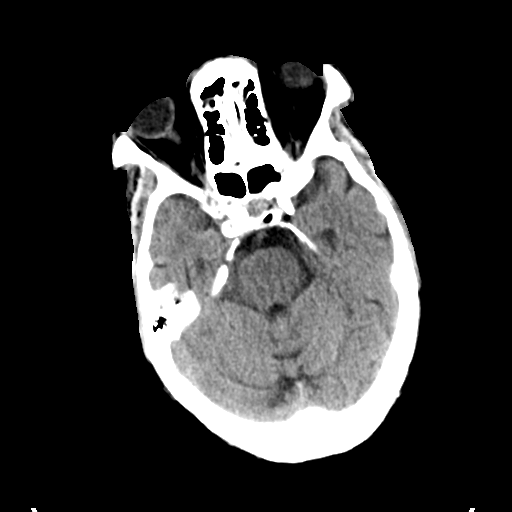
[im 13/31  brain]
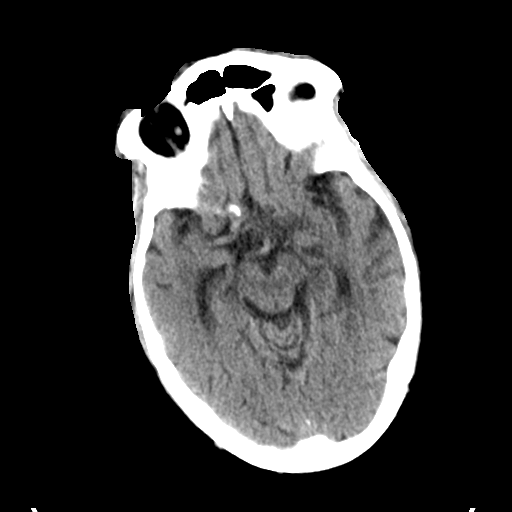
[im 15/31  brain]
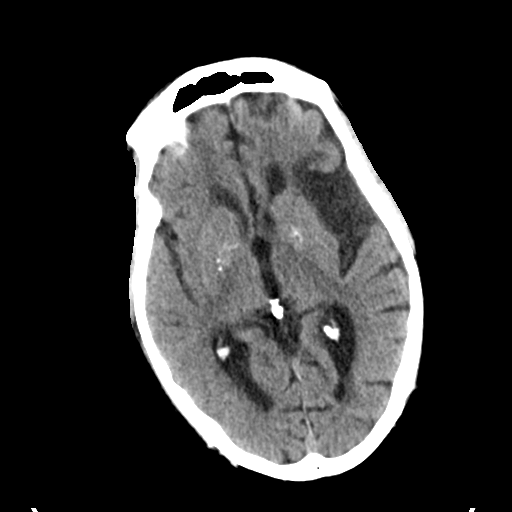
[im 16/31  brain]
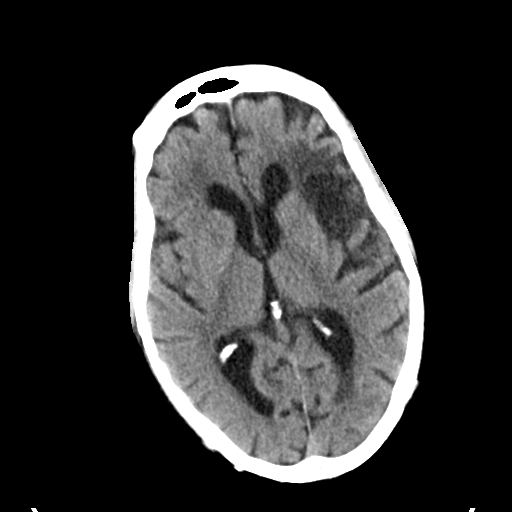
[im 16/31  bone]
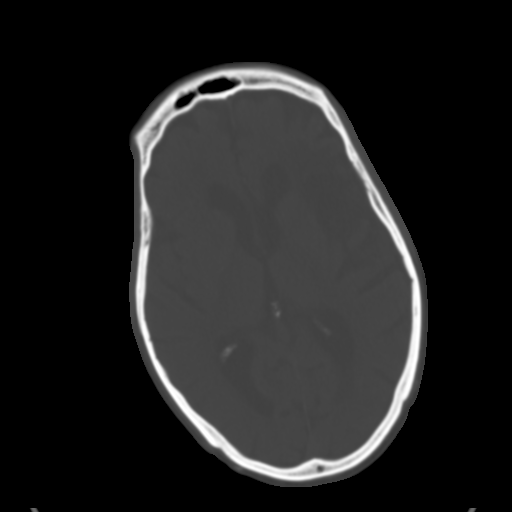
[im 18/31  brain]
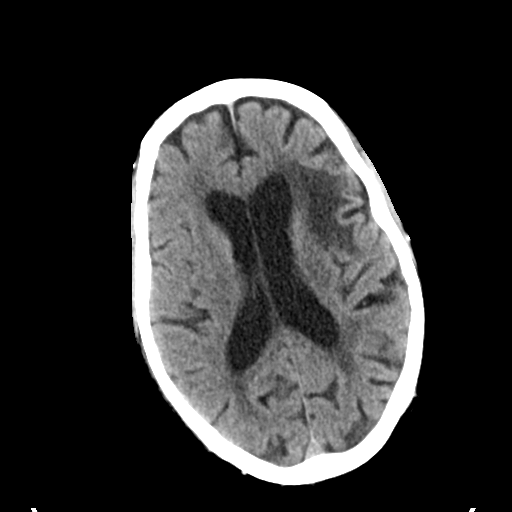
[im 20/31  brain]
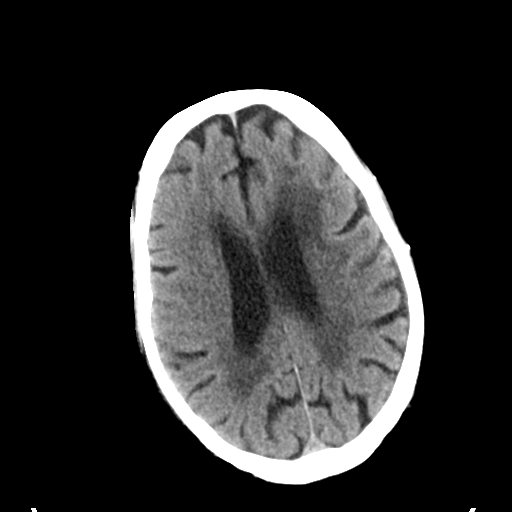
[im 22/31  brain]
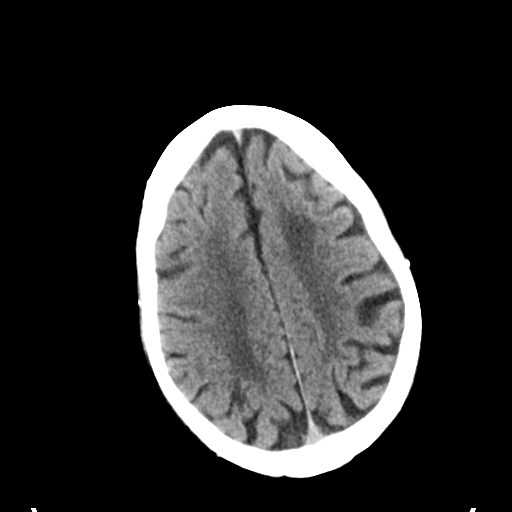
[im 23/31  brain]
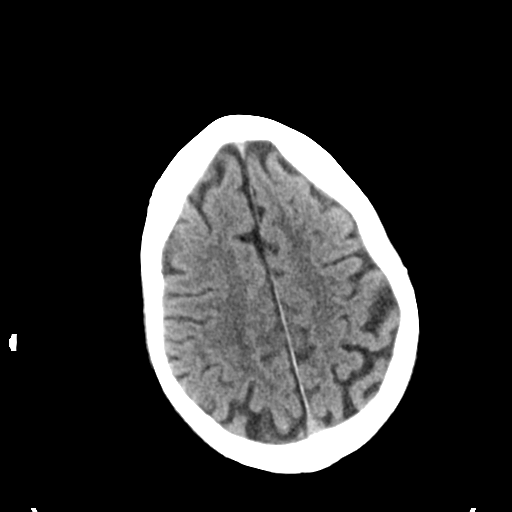
[im 23/31  bone]
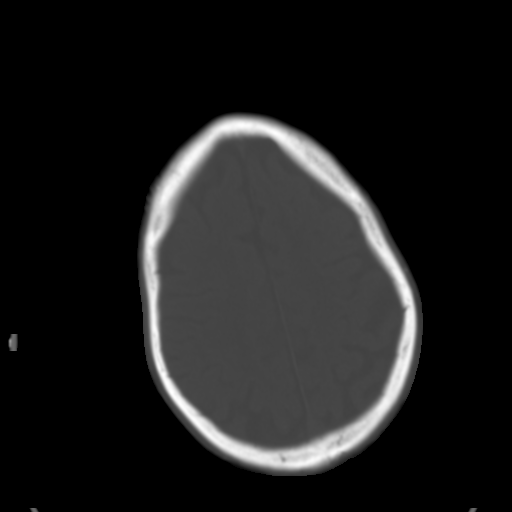
[im 25/31  brain]
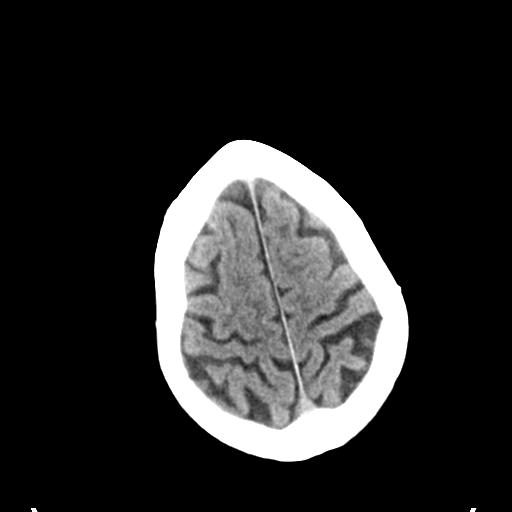
[im 27/31  brain]
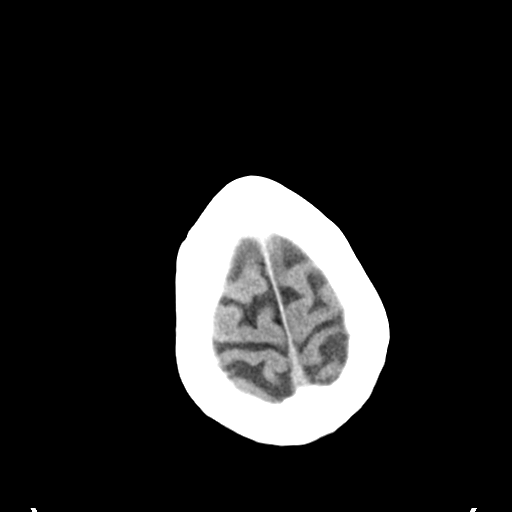
[im 29/31  brain]
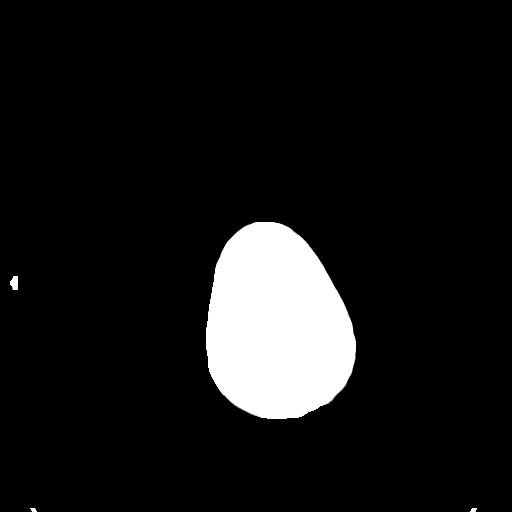

[16 of 30 positions shown; findings below may reference images not displayed]

FINDINGS: No intracranial hemorrhage. Stable atrophy and chronic small vessel
ischemia. Stable remote left MCA distribution infarct. No signs of
acute or subacute ischemia. No cerebral edema. Basal gangliar
calcifications again seen. No subdural or extra-axial collection.
Mild inflammatory change but paranasal sinuses, stable. No acute
bony abnormality.
IMPRESSION: 1. No acute intracranial abnormality. No signs of acute or subacute
ischemia.
2. Stable chronic change.
